# Patient Record
Sex: Male | Born: 1935 | Race: White | Hispanic: No | Marital: Married | Smoking: Former smoker
Health system: Southern US, Community
[De-identification: ages and names within clinical notes are randomized; demographics above are authoritative.]

## PROBLEM LIST (undated history)

## (undated) DIAGNOSIS — R001 Bradycardia, unspecified: Secondary | ICD-10-CM

## (undated) DIAGNOSIS — G459 Transient cerebral ischemic attack, unspecified: Secondary | ICD-10-CM

## (undated) DIAGNOSIS — I43 Cardiomyopathy in diseases classified elsewhere: Secondary | ICD-10-CM

## (undated) DIAGNOSIS — I10 Essential (primary) hypertension: Secondary | ICD-10-CM

## (undated) DIAGNOSIS — E785 Hyperlipidemia, unspecified: Secondary | ICD-10-CM

## (undated) DIAGNOSIS — N400 Enlarged prostate without lower urinary tract symptoms: Secondary | ICD-10-CM

## (undated) DIAGNOSIS — T7840XA Allergy, unspecified, initial encounter: Secondary | ICD-10-CM

## (undated) DIAGNOSIS — E854 Organ-limited amyloidosis: Secondary | ICD-10-CM

## (undated) DIAGNOSIS — D472 Monoclonal gammopathy: Secondary | ICD-10-CM

## (undated) DIAGNOSIS — K219 Gastro-esophageal reflux disease without esophagitis: Secondary | ICD-10-CM

## (undated) HISTORY — DX: Essential (primary) hypertension: I10

## (undated) HISTORY — PX: CARPAL TUNNEL RELEASE: SHX101

## (undated) HISTORY — DX: Allergy, unspecified, initial encounter: T78.40XA

## (undated) HISTORY — DX: Transient cerebral ischemic attack, unspecified: G45.9

## (undated) HISTORY — DX: Hyperlipidemia, unspecified: E78.5

## (undated) HISTORY — DX: Monoclonal gammopathy: D47.2

## (undated) HISTORY — DX: Organ-limited amyloidosis: I43

## (undated) HISTORY — DX: Benign prostatic hyperplasia without lower urinary tract symptoms: N40.0

## (undated) HISTORY — DX: Gastro-esophageal reflux disease without esophagitis: K21.9

## (undated) HISTORY — DX: Organ-limited amyloidosis: E85.4

## (undated) HISTORY — PX: OTHER SURGICAL HISTORY: SHX169

## (undated) HISTORY — PX: DG CHEST FOR MCHS EMPLOYEE: HXRAD142

---
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
## 1963-05-18 HISTORY — PX: OTHER SURGICAL HISTORY: SHX169

## 1989-01-15 DIAGNOSIS — E785 Hyperlipidemia, unspecified: Secondary | ICD-10-CM

## 1989-01-15 HISTORY — DX: Hyperlipidemia, unspecified: E78.5

## 1997-06-17 DIAGNOSIS — N400 Enlarged prostate without lower urinary tract symptoms: Secondary | ICD-10-CM

## 1997-06-17 HISTORY — DX: Benign prostatic hyperplasia without lower urinary tract symptoms: N40.0

## 2000-10-21 ENCOUNTER — Emergency Department (HOSPITAL_COMMUNITY): Admission: EM | Admit: 2000-10-21 | Discharge: 2000-10-21 | Payer: Self-pay | Admitting: *Deleted

## 2000-10-23 ENCOUNTER — Emergency Department (HOSPITAL_COMMUNITY): Admission: EM | Admit: 2000-10-23 | Discharge: 2000-10-23 | Payer: Self-pay | Admitting: Emergency Medicine

## 2001-03-23 HISTORY — PX: ESOPHAGOGASTRODUODENOSCOPY: SHX1529

## 2001-10-17 HISTORY — PX: OTHER SURGICAL HISTORY: SHX169

## 2001-12-06 ENCOUNTER — Encounter: Payer: Self-pay | Admitting: Family Medicine

## 2001-12-06 LAB — CONVERTED CEMR LAB: Hgb A1c MFr Bld: 5.4 %

## 2003-07-16 HISTORY — PX: CHOLECYSTECTOMY: SHX55

## 2003-09-17 ENCOUNTER — Emergency Department (HOSPITAL_COMMUNITY): Admission: EM | Admit: 2003-09-17 | Discharge: 2003-09-17 | Payer: Self-pay | Admitting: *Deleted

## 2003-09-28 ENCOUNTER — Ambulatory Visit (HOSPITAL_COMMUNITY): Admission: RE | Admit: 2003-09-28 | Discharge: 2003-09-28 | Payer: Self-pay | Admitting: Gastroenterology

## 2003-11-29 ENCOUNTER — Encounter (INDEPENDENT_AMBULATORY_CARE_PROVIDER_SITE_OTHER): Payer: Self-pay | Admitting: Specialist

## 2003-11-30 ENCOUNTER — Inpatient Hospital Stay (HOSPITAL_COMMUNITY): Admission: RE | Admit: 2003-11-30 | Discharge: 2003-12-01 | Payer: Self-pay | Admitting: Surgery

## 2004-01-22 ENCOUNTER — Emergency Department (HOSPITAL_COMMUNITY): Admission: EM | Admit: 2004-01-22 | Discharge: 2004-01-22 | Payer: Self-pay | Admitting: Emergency Medicine

## 2004-01-24 ENCOUNTER — Emergency Department (HOSPITAL_COMMUNITY): Admission: EM | Admit: 2004-01-24 | Discharge: 2004-01-24 | Payer: Self-pay | Admitting: Emergency Medicine

## 2004-01-29 ENCOUNTER — Emergency Department (HOSPITAL_COMMUNITY): Admission: EM | Admit: 2004-01-29 | Discharge: 2004-01-29 | Payer: Self-pay | Admitting: Family Medicine

## 2004-01-31 ENCOUNTER — Emergency Department (HOSPITAL_COMMUNITY): Admission: EM | Admit: 2004-01-31 | Discharge: 2004-01-31 | Payer: Self-pay | Admitting: Family Medicine

## 2004-02-15 DIAGNOSIS — I10 Essential (primary) hypertension: Secondary | ICD-10-CM

## 2004-02-15 HISTORY — DX: Essential (primary) hypertension: I10

## 2004-03-09 ENCOUNTER — Encounter: Payer: Self-pay | Admitting: Family Medicine

## 2004-03-09 LAB — CONVERTED CEMR LAB: PSA: 2.3 ng/mL

## 2004-04-15 ENCOUNTER — Ambulatory Visit: Payer: Self-pay | Admitting: Family Medicine

## 2004-05-14 ENCOUNTER — Ambulatory Visit: Payer: Self-pay | Admitting: Family Medicine

## 2004-08-12 ENCOUNTER — Ambulatory Visit: Payer: Self-pay | Admitting: Family Medicine

## 2005-01-27 ENCOUNTER — Ambulatory Visit: Payer: Self-pay | Admitting: Family Medicine

## 2005-01-27 LAB — CONVERTED CEMR LAB
Hgb A1c MFr Bld: 5.4 %
Microalbumin U total vol: 2.7 mg/L
PSA: 1.79 ng/mL

## 2005-02-01 ENCOUNTER — Ambulatory Visit: Payer: Self-pay | Admitting: Family Medicine

## 2005-02-12 ENCOUNTER — Ambulatory Visit: Payer: Self-pay | Admitting: Family Medicine

## 2005-03-17 ENCOUNTER — Ambulatory Visit: Payer: Self-pay | Admitting: Family Medicine

## 2005-03-25 ENCOUNTER — Ambulatory Visit: Payer: Self-pay | Admitting: Family Medicine

## 2005-07-26 ENCOUNTER — Ambulatory Visit: Payer: Self-pay | Admitting: Family Medicine

## 2005-07-29 ENCOUNTER — Ambulatory Visit: Payer: Self-pay | Admitting: Family Medicine

## 2006-02-09 ENCOUNTER — Ambulatory Visit: Payer: Self-pay | Admitting: Family Medicine

## 2006-03-04 ENCOUNTER — Ambulatory Visit: Payer: Self-pay | Admitting: Family Medicine

## 2006-03-17 ENCOUNTER — Ambulatory Visit: Payer: Self-pay | Admitting: Family Medicine

## 2006-04-18 ENCOUNTER — Ambulatory Visit: Payer: Self-pay | Admitting: Family Medicine

## 2006-10-17 ENCOUNTER — Inpatient Hospital Stay (HOSPITAL_COMMUNITY): Admission: EM | Admit: 2006-10-17 | Discharge: 2006-10-19 | Payer: Self-pay | Admitting: Emergency Medicine

## 2006-10-17 DIAGNOSIS — G459 Transient cerebral ischemic attack, unspecified: Secondary | ICD-10-CM | POA: Insufficient documentation

## 2006-10-18 ENCOUNTER — Encounter (INDEPENDENT_AMBULATORY_CARE_PROVIDER_SITE_OTHER): Payer: Self-pay | Admitting: *Deleted

## 2006-10-18 ENCOUNTER — Ambulatory Visit: Payer: Self-pay | Admitting: Internal Medicine

## 2006-10-18 ENCOUNTER — Encounter: Payer: Self-pay | Admitting: Family Medicine

## 2006-10-18 ENCOUNTER — Ambulatory Visit: Payer: Self-pay | Admitting: Vascular Surgery

## 2006-10-18 HISTORY — PX: OTHER SURGICAL HISTORY: SHX169

## 2006-10-19 ENCOUNTER — Encounter: Payer: Self-pay | Admitting: Family Medicine

## 2006-10-28 ENCOUNTER — Encounter: Payer: Self-pay | Admitting: Family Medicine

## 2006-10-28 DIAGNOSIS — F528 Other sexual dysfunction not due to a substance or known physiological condition: Secondary | ICD-10-CM

## 2006-10-28 DIAGNOSIS — I8390 Asymptomatic varicose veins of unspecified lower extremity: Secondary | ICD-10-CM

## 2006-10-29 DIAGNOSIS — E785 Hyperlipidemia, unspecified: Secondary | ICD-10-CM

## 2006-10-29 DIAGNOSIS — I10 Essential (primary) hypertension: Secondary | ICD-10-CM | POA: Insufficient documentation

## 2006-10-29 DIAGNOSIS — N400 Enlarged prostate without lower urinary tract symptoms: Secondary | ICD-10-CM | POA: Insufficient documentation

## 2006-10-31 ENCOUNTER — Ambulatory Visit: Payer: Self-pay | Admitting: Family Medicine

## 2006-11-01 DIAGNOSIS — I6529 Occlusion and stenosis of unspecified carotid artery: Secondary | ICD-10-CM

## 2006-11-21 ENCOUNTER — Ambulatory Visit: Payer: Self-pay | Admitting: Family Medicine

## 2007-03-06 ENCOUNTER — Ambulatory Visit: Payer: Self-pay | Admitting: Family Medicine

## 2007-03-07 LAB — CONVERTED CEMR LAB
ALT: 26 units/L (ref 0–53)
Albumin: 4.4 g/dL (ref 3.5–5.2)
Alkaline Phosphatase: 45 units/L (ref 39–117)
BUN: 15 mg/dL (ref 6–23)
Bilirubin, Direct: 0.3 mg/dL (ref 0.0–0.3)
CO2: 27 meq/L (ref 19–32)
Calcium: 9.5 mg/dL (ref 8.4–10.5)
Chloride: 108 meq/L (ref 96–112)
Cholesterol: 208 mg/dL (ref 0–200)
Creatinine,U: 113.5 mg/dL
Direct LDL: 157.6 mg/dL
HDL: 39.8 mg/dL (ref >39.0)
Microalb Creat Ratio: 1.8 mg/g (ref 0.0–30.0)
Microalb, Ur: 0.2 mg/dL (ref 0.0–1.9)
PSA: 1.94 ng/mL (ref 0.10–4.00)
TSH: 1.39 microintl units/mL (ref 0.35–5.50)
Total CHOL/HDL Ratio: 5.2
Total Protein: 7.2 g/dL (ref 6.0–8.3)
Triglycerides: 76 mg/dL (ref 0–149)

## 2007-03-09 ENCOUNTER — Ambulatory Visit: Payer: Self-pay | Admitting: Family Medicine

## 2007-04-04 ENCOUNTER — Encounter (INDEPENDENT_AMBULATORY_CARE_PROVIDER_SITE_OTHER): Payer: Self-pay | Admitting: *Deleted

## 2007-04-04 ENCOUNTER — Ambulatory Visit: Payer: Self-pay | Admitting: Family Medicine

## 2007-08-30 ENCOUNTER — Telehealth: Payer: Self-pay | Admitting: Family Medicine

## 2007-09-19 ENCOUNTER — Ambulatory Visit: Payer: Self-pay | Admitting: Family Medicine

## 2007-11-28 ENCOUNTER — Encounter: Payer: Self-pay | Admitting: Family Medicine

## 2008-11-25 ENCOUNTER — Ambulatory Visit: Payer: Self-pay | Admitting: Family Medicine

## 2008-11-25 LAB — CONVERTED CEMR LAB
Albumin: 4.2 g/dL (ref 3.5–5.2)
Alkaline Phosphatase: 35 units/L — ABNORMAL LOW (ref 39–117)
BUN: 16 mg/dL (ref 6–23)
Basophils Absolute: 0 10*3/uL (ref 0.0–0.1)
Bilirubin, Direct: 0.2 mg/dL (ref 0.0–0.3)
Chloride: 105 meq/L (ref 96–112)
Creatinine, Ser: 0.9 mg/dL (ref 0.4–1.5)
Direct LDL: 153.2 mg/dL
Eosinophils Absolute: 0.3 10*3/uL (ref 0.0–0.7)
HCT: 42.6 % (ref 39.0–52.0)
Lymphocytes Relative: 42.1 % (ref 12.0–46.0)
MCHC: 35.4 g/dL (ref 30.0–36.0)
MCV: 97.4 fL (ref 78.0–100.0)
Microalb Creat Ratio: 5.2 mg/g (ref 0.0–30.0)
Microalb, Ur: 0.5 mg/dL (ref 0.0–1.9)
Monocytes Absolute: 0.4 10*3/uL (ref 0.1–1.0)
Neutro Abs: 1.5 10*3/uL (ref 1.4–7.7)
Platelets: 142 10*3/uL — ABNORMAL LOW (ref 150.0–400.0)
Potassium: 4.2 meq/L (ref 3.5–5.1)
TSH: 2.35 microintl units/mL (ref 0.35–5.50)
Total Bilirubin: 1.9 mg/dL — ABNORMAL HIGH (ref 0.3–1.2)
Total CHOL/HDL Ratio: 5
Total Protein: 7.1 g/dL (ref 6.0–8.3)
VLDL: 14.4 mg/dL (ref 0.0–40.0)

## 2008-11-27 ENCOUNTER — Ambulatory Visit: Payer: Self-pay | Admitting: Family Medicine

## 2008-12-20 ENCOUNTER — Ambulatory Visit: Payer: Self-pay | Admitting: Family Medicine

## 2008-12-20 LAB — CONVERTED CEMR LAB: OCCULT 1: NEGATIVE

## 2008-12-23 ENCOUNTER — Encounter (INDEPENDENT_AMBULATORY_CARE_PROVIDER_SITE_OTHER): Payer: Self-pay | Admitting: *Deleted

## 2009-06-09 ENCOUNTER — Telehealth: Payer: Self-pay | Admitting: Family Medicine

## 2009-11-07 ENCOUNTER — Telehealth: Payer: Self-pay | Admitting: Family Medicine

## 2010-03-16 ENCOUNTER — Ambulatory Visit: Payer: Self-pay | Admitting: Family Medicine

## 2010-03-17 LAB — CONVERTED CEMR LAB
BUN: 19 mg/dL (ref 6–23)
Cholesterol: 207 mg/dL — ABNORMAL HIGH (ref 0–200)
Creatinine, Ser: 0.9 mg/dL (ref 0.4–1.5)
Creatinine,U: 169.3 mg/dL
Glucose, Bld: 90 mg/dL (ref 70–99)
Microalb Creat Ratio: 0.5 mg/g (ref 0.0–30.0)
Phosphorus: 3.2 mg/dL (ref 2.3–4.6)
Sodium: 140 meq/L (ref 135–145)
TSH: 1.62 microintl units/mL (ref 0.35–5.50)

## 2010-03-18 ENCOUNTER — Ambulatory Visit: Payer: Self-pay | Admitting: Family Medicine

## 2010-04-23 ENCOUNTER — Ambulatory Visit: Payer: Self-pay | Admitting: Family Medicine

## 2010-06-16 NOTE — Assessment & Plan Note (Signed)
Summary: CPX/CLE   Vital Signs:  Patient profile:   77 year old male Height:      67.5 inches Weight:      141.75 pounds BMI:     21.95 Temp:     98.5 degrees F oral Pulse rate:   60 / minute Pulse rhythm:   regular BP sitting:   120 / 70  (left arm) Cuff size:   regular  Vitals Entered By: Sydell Axon LPN 2010/03/28 1:52 PM) CC: 30 Minute checkup   History of Present Illness: Pt here for yearly appt...not seen  by doctor since last year. he feels well and has no complaints. He has more chest discomfort when he begins to eat he has "stopping of food passage" in mid chest. He then notes air biubble and hiccups. He then notices fairly acute resolution. He is unaware of anything prior to episcodes. This happens probably every other week.  He has a BP diary with him, generally good.  Occas he has tingling in the hands, right hand in middle and third fingers, and generally in the left. This happens typically at night.  Preventive Screening-Counseling & Management  Alcohol-Tobacco     Alcohol drinks/day: 0     Smoking Status: quit     Packs/Day: 45PYH     Year Started: 1939     Year Quit: 1972     Pack years: 75     Passive Smoke Exposure: no  Caffeine-Diet-Exercise     Caffeine use/day: 1     Does Patient Exercise: yes     Type of exercise: walking, bicycling     Times/week: 4  Problems Prior to Update: 1)  Special Screening Malig Neoplasms Other Sites  (ICD-V76.49) 2)  Carotid Artery Stenosis, Right  (ICD-433.10) 3)  Tia  (ICD-435.9) 4)  Erectile Dysfunction  (ICD-302.72) 5)  Abnormal Glucose Nec  (ICD-790.29) 6)  Gilbert's Syndrome  (ICD-277.4) 7)  Varicose Veins, Lower Extremities  (ICD-454.9) 8)  Benign Prostatic Hypertrophy  (ICD-600.00) 9)  Hypertension  (ICD-401.9) 10)  Hyperlipidemia  (ICD-272.4)  Medications Prior to Update: 1)  Lisinopril 5 Mg  Tabs (Lisinopril) .Marland Kitchen.. 1 Daily By Mouth 2)  Aggrenox 25-200 Mg  Cp12 (Aspirin-Dipyridamole) .Marland Kitchen.. 1  Capsule Twice  A Day By Mouth 3)  Fish Oil Concentrate 1000 Mg  Caps (Omega-3 Fatty Acids) .Marland Kitchen.. 1 Tablet Twice A Day By Mouth 4)  Benafiber .Marland Kitchen.. 1 Qd 5)  Viagra 100 Mg  Tabs (Sildenafil Citrate) .... 1/2 Tablet Pior To Relations  Allergies: 1)  ! Zocor 2)  ! Lipitor 3)  ! * Vytorin  Past History:  Past Medical History: Last updated: 10/28/2006 Hyperlipidemia: (1990'S) Hypertension :(02/2004) Benign prostatic hypertrophy:(06/1997)  Past Surgical History: Last updated: 11/27/2008 APPENDECTOMY 1947 BILAT HERNIA REPAIR  1965 CHOLEYCYSTECYOMY  07/2003 EGD  SCHATZKI RING DILATED, [facility] (Dr Maryruth Bun) (11/072002) HOLTER MONITOR ISOL PVCs SVCs  (10/17/2001) [facility] TIA, DYSLIPIDEMIA ,HTN ,RIGHT SIDED CAROTID ARTERY STENOSIS 40 TO 60% 06/02-08/2006) MRI BRAIN  NML  MRA BRAIN  INTRACRAN ATHERSC DZ  MCA RPCA 10/18/2006  HEAD CT NML 10/17/2006 MRI CERV SPINE  MILD DEGEN CHANGES C4/5 C5/6 10/18/2006  Family History: Last updated: 2010-03-28 Father:DECEASED 84 YOA: CIRRHOSIS/ NON-ACHOLOL  Mother: DECEASED 67 YOA STROKES, NATURAL CAUSES BROTHER A 79 LEONARD  BROTHER A  76 MELVIN   NEPHRECTOMY BROTHER A  65 CARY ETOH BILL DECEASED FROM MELANOMA AT 57YOA GENE DECEASED CANCER OF THE LUNGS AT 22 YOA (SMOKER) SISTER :DM, CAD,AFIB., GI BLEED,  FE DEFICIENCY CV: + SISTER HBP: NEGATIVE DM: +SISTER PROSTATE CANCER: BROTHER 1 MELANOMA/ 1 OF LUNG DEPRESSION: NEGATIVE ETOH/DRUG ABUSE: NEGATIVE OTHER + STROKE MOTHER   Social History: Last updated: 11/27/2008 Marital Status: Married: REMARRIED  Lives w/wife Children: 4 STEPDAUGHTERS Occupation: RETIRED [employer]/ WORKS 4 DAYS A WEEK AT [employer]  Risk Factors: Alcohol Use: 0 (03/18/2010) Caffeine Use: 1 (03/18/2010) Exercise: yes (03/18/2010)  Risk Factors: Smoking Status: quit (03/18/2010) Packs/Day: 45PYH (03/18/2010) Passive Smoke Exposure: no (03/18/2010)  Family History: Father:DECEASED 64 YOA: CIRRHOSIS/ NON-ACHOLOL  Mother: DECEASED  33 YOA STROKES, NATURAL CAUSES BROTHER A 79 LEONARD  BROTHER A  76 MELVIN   NEPHRECTOMY BROTHER A  67 CARY ETOH BILL DECEASED FROM MELANOMA AT 59YOA GENE DECEASED CANCER OF THE LUNGS AT 31 YOA (SMOKER) SISTER :DM, CAD,AFIB., GI BLEED,  FE DEFICIENCY CV: + SISTER HBP: NEGATIVE DM: +SISTER PROSTATE CANCER: BROTHER 1 MELANOMA/ 1 OF LUNG DEPRESSION: NEGATIVE ETOH/DRUG ABUSE: NEGATIVE OTHER + STROKE MOTHER   Social History: Caffeine use/day:  1  Review of Systems General:  Denies chills, fatigue, fever, sweats, weakness, and weight loss. Eyes:  Denies blurring, discharge, eye irritation, and eye pain; recent eye exam, mild scri[pt change.. ENT:  Complains of decreased hearing; denies ear discharge, earache, and postnasal drainage; baseline.. CV:  Denies chest pain or discomfort, fainting, fatigue, palpitations, shortness of breath with exertion, swelling of feet, and swelling of hands. Resp:  Denies cough, shortness of breath, and wheezing. GI:  Denies abdominal pain, bloody stools, change in bowel habits, constipation, dark tarry stools, diarrhea, indigestion, loss of appetite, nausea, vomiting, vomiting blood, and yellowish skin color. GU:  Complains of nocturia and urinary frequency; denies discharge and dysuria. MS:  Complains of cramps; denies joint pain, low back pain, and muscle aches; hand  and rare crampos. Derm:  Denies dryness, itching, and rash. Neuro:  Complains of tingling; denies poor balance and tremors; hands per HPIU.  Physical Exam  General:  Well-developed,well-nourished,in no acute distress; alert,appropriate and cooperative throughout examination, looks good. Head:  Normocephalic and atraumatic without obvious abnormalities. No apparent alopecia, has signif male pattern  balding. Eyes:  Conjunctiva clear bilaterally.  Ears:  External ear exam shows no significant lesions or deformities.  Otoscopic examination reveals clear canals, tympanic membranes are intact  bilaterally without bulging, retraction, inflammation or discharge. Hearing is grossly normal bilaterally. Nose:  External nasal examination shows no deformity or inflammation. Nasal mucosa are pink and moist without lesions or exudates. Mouth:  Oral mucosa and oropharynx without lesions or exudates.  Teeth in good repair. Neck:  No deformities, masses, or tenderness noted. Chest Wall:  No deformities, masses, tenderness or gynecomastia noted. Breasts:  No masses or gynecomastia noted Lungs:  Normal respiratory effort, chest expands symmetrically. Lungs are clear to auscultation, no crackles or wheezes. Heart:  Normal rate and regular rhythm. S1 and S2 normal without gallop, murmur, click, rub or other extra sounds. Abdomen:  Bowel sounds positive,abdomen soft and non-tender without masses, organomegaly or hernias noted. Rectal:  No external abnormalities noted. Normal sphincter tone. No rectal masses or tenderness. Mild decompressed ext hemm on left. G neg. Genitalia:  Testes bilaterally descended without nodularity, tenderness or masses. No scrotal masses or lesions. No penis lesions or urethral discharge. Prostate:  Prostate gland firm and smooth, no enlargement, nodularity, tenderness, mass, asymmetry or induration. 30-40gms. Msk:  No deformity or scoliosis noted of thoracic or lumbar spine.   Pulses:  R and L carotid,radial,femoral,dorsalis pedis and posterior tibial pulses  are full and equal bilaterally Extremities:  No clubbing, cyanosis, edema, or deformity noted with normal full range of motion of all joints.  Tinel's mildly pods on left. Phalen's negative. Neurologic:  No cranial nerve deficits noted. Station and gait are normal. Sensory, motor and coordinative functions appear intact. Skin:  Intact without suspicious lesions or rashes, disseminated occas benign moles. Cervical Nodes:  No lymphadenopathy noted Inguinal Nodes:  No significant adenopathy Psych:  Cognition and judgment  appear intact. Alert and cooperative with normal attention span and concentration. No apparent delusions, illusions, hallucinations   Impression & Recommendations:  Problem # 1:  ABNORMAL GLUCOSE NEC (ICD-790.29) Assessment Improved  Normal and has been for a while...consider resolved.  Labs Reviewed: Creat: 0.9 (03/16/2010)   Microalbumin: 2.7 (01/27/2005)  Problem # 2:  VARICOSE VEINS, LOWER EXTREMITIES (ICD-454.9) Assessment: Improved Wears support hose with good results. Continue.  Problem # 3:  BENIGN PROSTATIC HYPERTROPHY (ICD-600.00) Assessment: Unchanged Stable with mild sxs, cont to follow.  Problem # 4:  HYPERTENSION (ICD-401.9) Assessment: Unchanged Stable, diary generally great nos. His updated medication list for this problem includes:    Lisinopril 5 Mg Tabs (Lisinopril) .Marland Kitchen... 1 daily by mouth  BP today: 120/70 Prior BP: 120/60 (11/27/2008)  Labs Reviewed: K+: 4.0 (03/16/2010) Creat: : 0.9 (03/16/2010)   Chol: 207 (03/16/2010)   HDL: 37.60 (03/16/2010)   LDL: DEL (03/06/2007)   TG: 96.0 (03/16/2010)  Problem # 5:  HYPERLIPIDEMIA (ICD-272.4) Assessment: Unchanged Adequate but watch diet to attempt getting LDL lower. Labs Reviewed: SGOT: 26 (11/25/2008)   SGPT: 26 (11/25/2008)   HDL:37.60 (03/16/2010), 40.50 (11/25/2008)  LDL:DEL (03/06/2007)  Chol:207 (03/16/2010), 209 (11/25/2008)  Trig:96.0 (03/16/2010), 72.0 (11/25/2008)  Complete Medication List: 1)  Lisinopril 5 Mg Tabs (Lisinopril) .Marland Kitchen.. 1 daily by mouth 2)  Aggrenox 25-200 Mg Cp12 (Aspirin-dipyridamole) .Marland Kitchen.. 1 capsule twice  a day by mouth 3)  Fish Oil Concentrate 1000 Mg Caps (Omega-3 fatty acids) .Marland Kitchen.. 1 tablet twice a day by mouth 4)  Viagra 100 Mg Tabs (Sildenafil citrate) .... 1/2 tablet pior to relations 5)  Benefiber Tabs (Wheat dextrin) .... Take one by mouth daily  Patient Instructions: 1)  RTC 1 month. 2)  Try Jogging in a Jug daily for one month. 3)  Try cockup splints of wrists at  night.   Orders Added: 1)  Est. Patient Level IV [84132]    Current Allergies (reviewed today): ! ZOCOR ! LIPITOR ! * VYTORIN

## 2010-06-16 NOTE — Assessment & Plan Note (Signed)
Summary: ONE MONTH FOLLOW UP / LFW   Vital Signs:  Patient profile:   77 year old male Weight:      141.25 pounds Temp:     98.1 degrees F oral Pulse rate:   60 / minute Pulse rhythm:   regular BP sitting:   116 / 60  (left arm) Cuff size:   regular  Vitals Entered By: Sydell Axon LPN (April 23, 2010 10:15 AM) CC: One month follow-up   History of Present Illness: Pt here for followup of difficulty swallowing at times. His wife is using Jogging in a Jug, having made it herself. He has been using a commercial concoction he bought over the internet. He has had no severe episodes of difficulty passing food since being seen.  He has had endoscopy in the pas with dilation. Our discussion eventuated on his not wanting to have dilation again if not needed. He has had a cold in the past and has some residual cough.  His younger broither, a political figure in [place] is currently in [facility] for detox and ETOHism trmt. He thinks his brother has lost the will to live. He also has ED, uses Viagra but has had instances of no ejaculation.  Problems Prior to Update: 1)  Special Screening Malig Neoplasms Other Sites  (ICD-V76.49) 2)  Carotid Artery Stenosis, Right  (ICD-433.10) 3)  Tia  (ICD-435.9) 4)  Erectile Dysfunction  (ICD-302.72) 5)  Gilbert's Syndrome  (ICD-277.4) 6)  Varicose Veins, Lower Extremities  (ICD-454.9) 7)  Benign Prostatic Hypertrophy  (ICD-600.00) 8)  Hypertension  (ICD-401.9) 9)  Hyperlipidemia  (ICD-272.4)  Medications Prior to Update: 1)  Lisinopril 5 Mg  Tabs (Lisinopril) .Marland Kitchen.. 1 Daily By Mouth 2)  Aggrenox 25-200 Mg  Cp12 (Aspirin-Dipyridamole) .Marland Kitchen.. 1 Capsule Twice  A Day By Mouth 3)  Fish Oil Concentrate 1000 Mg  Caps (Omega-3 Fatty Acids) .Marland Kitchen.. 1 Tablet Twice A Day By Mouth 4)  Viagra 100 Mg  Tabs (Sildenafil Citrate) .... 1/2 Tablet Pior To Relations 5)  Benefiber  Tabs (Wheat Dextrin) .... Take One By Mouth Daily  Current Medications (verified): 1)   Lisinopril 5 Mg  Tabs (Lisinopril) .Marland Kitchen.. 1 Daily By Mouth 2)  Aggrenox 25-200 Mg  Cp12 (Aspirin-Dipyridamole) .Marland Kitchen.. 1 Capsule Twice  A Day By Mouth 3)  Fish Oil Concentrate 1000 Mg  Caps (Omega-3 Fatty Acids) .Marland Kitchen.. 1 Tablet Twice A Day By Mouth 4)  Viagra 100 Mg  Tabs (Sildenafil Citrate) .... 1/2 Tablet Pior To Relations 5)  Benefiber  Tabs (Wheat Dextrin) .... Take One By Mouth Daily  Allergies: 1)  ! Zocor 2)  ! Lipitor 3)  ! * Vytorin  Physical Exam  General:  Well-developed,well-nourished,in no acute distress; alert,appropriate and cooperative throughout examination, looks good. Head:  Normocephalic and atraumatic without obvious abnormalities. No apparent alopecia, has signif male pattern  balding. Eyes:  Conjunctiva clear bilaterally.  Ears:  External ear exam shows no significant lesions or deformities.  Otoscopic examination reveals clear canals, tympanic membranes are intact bilaterally without bulging, retraction, inflammation or discharge. Hearing is grossly normal bilaterally. Nose:  External nasal examination shows no deformity or inflammation. Nasal mucosa are pink and moist without lesions or exudates. Mouth:  Oral mucosa and oropharynx without lesions or exudates.  Teeth in good repair. Neck:  No deformities, masses, or tenderness noted. Chest Wall:  No deformities, masses, tenderness or gynecomastia noted. Lungs:  Normal respiratory effort, chest expands symmetrically. Lungs are clear to auscultation, no crackles or  wheezes. Heart:  Normal rate and regular rhythm. S1 and S2 normal without gallop, murmur, click, rub or other extra sounds. Abdomen:  Bowel sounds positive,abdomen soft and non-tender without masses, organomegaly or hernias noted.   Impression & Recommendations:  Problem # 1:  DYSPHAGIA UNSPEC (ESOPH SPH) (ICD-787.20) Assessment Improved Cont with Jogging in a Jug. See no reason to need commercial version. Homemade fine. RTC if sxs worsen or call...may need  dilation.  Problem # 2:  ERECTILE DYSFUNCTION (ICD-302.72) Assessment: Unchanged Helped with Viagra but occas has no ejcaculation. Discussed. His updated medication list for this problem includes:    Viagra 100 Mg Tabs (Sildenafil citrate) .Marland Kitchen... 1/2 tablet pior to relations  Complete Medication List: 1)  Lisinopril 5 Mg Tabs (Lisinopril) .Marland Kitchen.. 1 daily by mouth 2)  Aggrenox 25-200 Mg Cp12 (Aspirin-dipyridamole) .Marland Kitchen.. 1 capsule twice  a day by mouth 3)  Fish Oil Concentrate 1000 Mg Caps (Omega-3 fatty acids) .Marland Kitchen.. 1 tablet twice a day by mouth 4)  Viagra 100 Mg Tabs (Sildenafil citrate) .... 1/2 tablet pior to relations 5)  Benefiber Tabs (Wheat dextrin) .... Take one by mouth daily  Patient Instructions: 1)  Discussed brother's situation as well. Gave some reassurance.   Orders Added: 1)  Est. Patient Level III [04540]    Current Allergies (reviewed today): ! ZOCOR ! LIPITOR ! * VYTORIN

## 2010-06-16 NOTE — Progress Notes (Signed)
Summary: Viagra  Phone Note Refill Request Message from:  [employer] on November 07, 2009 11:11 AM  Refills Requested: Medication #1:  VIAGRA 100 MG  TABS 1/2 tablet pior to relations. [facility]*   Last Fill Date:  10/16/2009   Pharmacy Phone:  [phone]   Method Requested: Electronic Initial call taken by: Delilah Shan CMA Duncan Dull),  November 07, 2009 11:11 AM    Prescriptions: VIAGRA 100 MG  TABS (SILDENAFIL CITRATE) 1/2 tablet pior to relations  #10 x 11   Entered and Authorized by:   Shaune Leeks MD   Signed by:   Shaune Leeks MD on 11/09/2009   Method used:   Electronically to        [facility]* (retail)       [address]       [place], [place]  [address]       Ph: [phone]       Fax: [phone]   RxID:   [id number]

## 2010-06-16 NOTE — Progress Notes (Signed)
Summary: Rx Aggrenox  Phone Note Refill Request Call back at [phone] Message from:  [facility] on June 09, 2009 8:37 AM  Refills Requested: Medication #1:  AGGRENOX 25-200 MG  CP12 1 CAPSULE twice  a day by mouth   Last Refilled: 05/01/2009 Received e-scribe refill request   Method Requested: Electronic Initial call taken by: Sydell Axon LPN,  June 09, 2009 8:37 AM    Prescriptions: AGGRENOX 25-200 MG  CP12 (ASPIRIN-DIPYRIDAMOLE) 1 CAPSULE twice  a day by mouth  #60 x 12   Entered and Authorized by:   Shaune Leeks MD   Signed by:   Shaune Leeks MD on 06/09/2009   Method used:   Electronically to        [facility]* (retail)       [address]       [place], [place]  [address]       Ph: [phone]       Fax: [phone]   RxID:   [id number]

## 2010-09-08 ENCOUNTER — Other Ambulatory Visit: Payer: Self-pay | Admitting: Family Medicine

## 2010-09-29 NOTE — Consult Note (Signed)
[name], [name] NO.:  [id number]   MEDICAL RECORD NO.:  [id number]          PATIENT TYPE:  OBV   LOCATION:  3742                         FACILITY:  [facility]   PHYSICIAN:  Bevelyn Buckles. Champey, M.D.DATE OF BIRTH:  Feb 09, 1924   DATE OF CONSULTATION:  DATE OF DISCHARGE:                                 CONSULTATION   NEUROLOGY CONSULT:   REQUESTING PHYSICIAN:  Dr. Freida Busman.   REASON FOR CONSULTATION:  Transient ischemic attack.   HISTORY OF PRESENT ILLNESS:  [name] is an 77 year old Caucasian male  with a past medical history of hypertension and hyperlipidemia who had  onset of left-sided numbness and weakness that occurred today.  Patient  stated he was in a normal state of health when he woke up this morning,  when he was making breakfast around 6:30 a.m. he noticed a tingling in  his left upper extremity, noticed weak and clumsiness in his left upper  extremity and felt weak in his left lower extremity.  Patient's symptoms  lasted less than 1 hour.  Patient still states he has slight left hand  tingling.  He had a similar event 2 months ago with left-sided weakness.  He denies any headache, neck pain, vision changes, speech changes,  swallowing problems, chewing problems, dizziness, vertigo or loss of  consciousness.   PAST MEDICAL HISTORY:  Positive for:  1. Hypertension.  2. Hyperlipidemia.   CURRENT MEDICATIONS:  Include, lisinopril, Benefiber, fish oil and  aspirin.   ALLERGIES:  PATIENT HAS NO KNOWN DRUG ALLERGIES.   FAMILY HISTORY:  Positive for hypertension, diabetes, stroke and cancer.   SOCIAL HISTORY:  Patient lives with his wife.  Denies any smoking or  alcohol use.   REVIEW OF SYSTEMS:  Positive as per HPI.  Review of systems negative as  per HPI and greater than other review of systems.   PHYSICAL EXAMINATION:  VITAL SIGNS:  Temperature is 98.0.  Pulse is 57.  Respirations 20.  Blood pressure 154/77.  O2 saturation is 97%.  HEENT:   Normocephalic, atraumatic.  Extraocular muscles are intact.  Visual fields are intact.  NECK:  Supple.  No carotid bruits.  HEART:  Regular.  LUNGS:  Clear.  ABDOMEN:  Soft.  EXTREMITIES:  Show good pulses.  NEUROLOGICAL EXAMINATION:  Patient is awake and alert, lying absolute.  Memory and drift within normal limits.  Cranial nerves II-XII are  grossly intact.  Motor examination, shows 5/5 strength, no drift.  Sensory examination is within normal limits to light touch.  Reflexes  are trace to 1+ throughout.  Toes are neutral bilaterally.  Cerebellar  function is within normal limits finger-to-nose.  Gait is not assessed  secondary to safety.   LABORATORY DATA:  WBC is 4.9, hemoglobin 15.2, hematocrit is 43.8,  platelets 159.  PT is 15.3, INR is 1.2.  Sodium is 138, potassium is  4.0, chloride is 105, CO2 is 27, BUN 12, creatinine 0.85, glucose 97,  calcium is 9.3, total bilirubin is 1.8.  LFTs are normal.  Cholesterol  done at 8:45 showed a total cholesterol of 194, LDL  of 138 (question  fasting).  CK was 94, CK-MB was 2.4.  CT of the head showed no acute  findings.   IMPRESSION:  This is an 77 year old Caucasian male with transient left-  sided numbness and weakness, which has resolved.  Transient ischemic  attack versus possible small stroke.  Recommend getting an MRI,  including an MRA of the brain.  We will change his aspirin to Aggrenox.  Get carotid Dopplers and 2D echocardiogram.  We check a fasting lipid  and homocystine level in the morning.  We will get physical therapy and  occupational therapy consults.  We will continue his other home  medications and we will follow patient in short consult service.      Bevelyn Buckles. Nash Shearer, M.D.  Electronically Signed     DRC/MEDQ  D:  10/17/2006  T:  10/18/2006  Job:  [id number]

## 2010-09-29 NOTE — Discharge Summary (Signed)
[name], [name]                ACCOUNT NO.:  [id number]   MEDICAL RECORD NO.:  [id number]          PATIENT TYPE:  OBV   LOCATION:  3742                         FACILITY:  [facility]   PHYSICIAN:  Valerie A. Felicity Coyer, MDDATE OF BIRTH:  24-Mar-1924   DATE OF ADMISSION:  10/17/2006  DATE OF DISCHARGE:  10/19/2006                               DISCHARGE SUMMARY   DISCHARGE DIAGNOSES:  1. Transient ischemic attack.  2. Dyslipidemia.  3. Hypertension.  4. Right-sided carotid artery stenosis 40 to 60%.   HISTORY OF PRESENT ILLNESS:  [name] is an 77 year old white male  with a past medical history of hypertension, hyperlipidemia who  presented with chief complaint of abnormal sensation and tingling on the  left side of his body.  The patient and wife noted that he was unable to  grab objects at breakfast that day.  His episode lasted approximately 45  minutes to 1 hour, and his symptoms greatly improved.  He was admitted  for further evaluation and treatment.   PAST MEDICAL HISTORY:  1. Hypertension.  2. Hyperlipidemia.  3. Status post appendectomy.  4. Status post cholecystectomy.  5. Status post bilateral hernia repairs.   COURSE OF HOSPITALIZATION:  1. TIA:  The patient was admitted and underwent a CT of the head,      which showed no acute changes.  An MRI/MRA of the brain was      performed, which showed no acute intracranial abnormality.  It did      note chronic sinusitis and atherosclerotic disease, especially in      the right MCA and right PCA.  An MRI of the c-spine was also      performed at the same time, which noted mild degenerative changes      and small central disk protrusions, C4-5 and C5-6.  The patient      underwent a 2D echo, which at the time of this dictation is      currently pending.  Carotid Doppler was performed, which noted 40-      60% right ICA stenosis.  The patient has been placed on Aggrenox      during this admission per neurology  recommendations.  2. Hyperlipidemia:  The patient was noted to have elevated LDL and low      HDL.  It was recommended that the patient be placed on a Statin.      According to the patient, he had previously tried Lipitor in the      past; however, this caused heart palpitations and was discontinued.      The patient notes that he has stopped taking his Zetia as      prescribed prior to this admission and in its place has been taking      fish oil and baby aspirin.  He was started on Zocor during this      admission; however, he is currently refusing due to headache;      however, headache is most likely secondary to addition of Aggrenox      and should improve over the next week  or so.  Could consider      reintroducing Statin at that time.  We will defer to patient's      primary care physician.   MEDICATIONS AT THE TIME OF DISCHARGE:  1. Lisinopril 5 mg p.o. daily.  2. Aggrenox 1 cap p.o. b.i.d.   PERTINENT LABORATORY AT THE TIME OF DISCHARGE:  Hemoglobin A1c 5.7, TSH  1.53, homocysteine 9.0.   DISPOSITION:  The patient will be discharged to home.   FOLLOWUP:  He is instructed to follow up with Dr. Hetty Ely in 1 to 2  weeks and contact the office for an appointment.  He is also instructed  to return to the emergency room should he have recurrent tingling,  weakness, or slurry speech.      Sandford Craze, NP      Raenette Rover. Felicity Coyer, MD  Electronically Signed    MO/MEDQ  D:  10/19/2006  T:  10/19/2006  Job:  [id number]   cc:   Arta Silence, MD

## 2010-09-29 NOTE — H&P (Signed)
NAMEKOBE, OFALLON NO.:  [id number]   MEDICAL RECORD NO.:  [id number]          PATIENT TYPE:  OBV   LOCATION:  1824                         FACILITY:  [facility]   PHYSICIAN:  Barbette Hair. Artist Pais, DO      DATE OF BIRTH:  October 22, 1923   DATE OF ADMISSION:  10/17/2006  DATE OF DISCHARGE:                              HISTORY & PHYSICAL   PRIMARY CARE PHYSICIAN:  Arta Silence, MD   HISTORY OF PRESENT ILLNESS:  The patient is an 77 year old white male  with past medical history of hypertension and hyperlipidemia who  presents with abnormal sensation, tingling, on the left side of the  body.  He awoke this morning at approximately 6 a.m. with a tingling  sensation down his left arm.  This spread to his left leg.  The patient  and wife noted that he was unable to grip objects at the breakfast  table.  This episode lasted approximately 45 minutes to 1 hour.  There  were no changes in his speech, and his symptoms gradually improved where  now he only notes tingling sensation in his left forearm.  Approximately  2 months ago, the patient had similar symptoms that were reported to Dr.  Hetty Ely.  He denies any associated symptoms such as chest pain,  palpitations, shortness of breath.  He was in his usual state of health  prior to this morning's episode.   PAST MEDICAL HISTORY:  1. Hypertension, mild, started on ACE inhibitor one year ago.  2. Hyperlipidemia.  Primary prevention with intolerance to LIPITOR      which causes palpitations.  3. Status post appendectomy.  4. Status post cholecystectomy.  5. Status post hernia repair bilaterally.   REVIEW OF SYSTEMS:  No other associated symptoms.  He denies any history  of neck injury or nerve impingement.  No chronic back pain, no headache,  no change in vision.   CURRENT MEDICATIONS:  \  1. Lisinopril 5 mg once a day.  2. Aspirin 81 mg once a day.  3. Zetia 10 mg which he has not take recently.   SOCIAL HISTORY:   Retired from [employer] as a Pharmacist, hospital.  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  He is married  to his second wife, has four step children.   HABITS:  Remote history of tobacco, quit in 1972, 35 to 45-pack-year  history.  He does not drink any alcohol.   FAMILY HISTORY:  Mother deceased from stroke.  Father had cirrhosis not  related to alcohol.  On brother had melanoma, a sister with diabetes.   SUPPLEMENTS:  Over-the-counter fish oil and Benefiber.   LABORATORY DATA:  CBC showed WBC 4.9, hemoglobin 15.2, hematocrit 42.8,  platelet count 159.  INR 1.2.  Comprehensive metabolic profile notable  for normal electrolytes.  Potassium 4.0, sodium 138, glucose 97, BUN 12,  creatinine 0.85. Bilirubin total was slightly elevated at 1.8, otherwise  other transaminases were normal.   The patient had CT of the head which revealed no acute intracranial  findings.   Chest  x-ray was unremarkable.   PHYSICAL EXAMINATION:  VITAL SIGNS:  Temperature 97.9, blood pressure  113/67, pulse 65, respirations 16.  The patient is 97-99% on room air.  GENERAL:  The patient is a thin 77 year old white male in no apparent  distress.  HEENT:  Normocephalic and atraumatic.  Pupils equal, round, and reactive  to light bilaterally.  Extraocular mobility was intact.  The patient was  anicteric.  Conjunctivae within normal limits.  Oropharyngeal exam was  unremarkable.  Uvula was midline.  NECK:  Supple.  No adenopathy, carotid bruit, or adenopathy.  CHEST:  Normal respiratory effort.  Clear to auscultation bilaterally.  No rhonchi, rales, or wheezing.  CARDIOVASCULAR:  Regular rate and rhythm.  No significant murmurs, rubs,  or gallops appreciated.  ABDOMEN:  Soft, nontender.  Positive bowel sounds.  No organomegaly.  MUSCULOSKELETAL:  The patient had bilateral Heberden's and Bouchard  nodes.  No clubbing, no lower extremity edema.  NEUROLOGIC:  Cranial nerves II-XII intact.   Nonfocal.  The patient had  5/5 muscle strength throughout.  He had no pronator drift.  No  cerebellar signs.  Reflexes were 0-1 throughout. He had negative  Babinski.   IMPRESSION:  1. Paresthesia, left side, of unclear etiology.  2. Hypertension, controlled.  3. History of hyperlipidemia.   RECOMMENDATIONS:  The patient's symptoms may be consistent with TIA or  mild lacunar stroke.  The patient will be admitted for further  observation.  We will order an MRI of the brain, and neurology consult  will be considered.  We will cycle cardiac enzymes, repeat A1c, check  thyroid studies, and check homocysteine level.  Also we will check a  carotid Doppler.  If MRI is completely normal, then I recommend further  workup as an outpatient with possible EMG to the left arm.      Barbette Hair. Artist Pais, DO  Electronically Signed     RDY/MEDQ  D:  10/17/2006  T:  10/17/2006  Job:  [id number]   cc:   Arta Silence, MD

## 2010-10-02 NOTE — Op Note (Signed)
NAMESAHEED, CARRINGTON NO.:  [id number]   MEDICAL RECORD NO.:  [id number]                   PATIENT TYPE:  OBV   LOCATION:  0349                                 FACILITY:  [facility]   PHYSICIAN:  Thornton Park. Daphine Deutscher, M.D.             DATE OF BIRTH:  05-Jul-1923   DATE OF PROCEDURE:  11/29/2003  DATE OF DISCHARGE:                                 OPERATIVE REPORT   CCS NUMBER:  [id number].   PREOPERATIVE DIAGNOSIS:  Cholecystitis.   POSTOPERATIVE DIAGNOSIS:  Acute and subacute cholecystitis with proximal  common bile duct stone.   PROCEDURE:  Laparoscopic cholecystectomy with intraoperative cholangiogram.   SURGEON:  Thornton Park. Daphine Deutscher, M.D.   ASSISTANT:  Ollen Gross. Carolynne Edouard, M.D.   ANESTHESIA:  General endotracheal.   DRAINS:  One Jackson-Pratt drain to the gallbladder fossa.   DESCRIPTION OF PROCEDURE:  [name] was taken to [facility] on Friday,  November 29, 2003 and given general anesthesia.  The abdomen was prepped with  Betadine and draped sterilely.  I entered with a longitudinal incision into  the umbilicus without difficulty using the Hasson technique and insufflated  the abdomen.  Three ports were placed in the upper abdomen.  The gallbladder  was noted to be walled off with an omental pack, and this was stripped away.  Pictures, which are in the chart, show the gallbladder to be fiery red with  some splotches of green, signifying early gangrenous changes of the  gallbladder wall anteriorly.  I was able to strip these away and dissect  free the cystic duct, and I inserted a Reddick catheter through the medial  port using the stem introducer.  Then a cholangiogram was obtained, in which  I initial filled the distal common bile duct and then deflated the balloon,  allowing for proximal proper hepatic filling.  One filling defect was  questioned by Dr. Allyson Sabal, whether this may be overlapping shadows or in fact  a nonmoving stone, it is not clear, but will  be noted.  In the meantime, the  cystic duct had been dissected free, triple clipped, divided.  The cystic  artery triply clipped and divided.  The gallbladder removed from the  gallbladder bed with some difficulty because of the inflammatory changes.  I  also entered it where it was necrotic.  I sucked out stuff as it came along  and controlled any kind of leakage, and we essentially got no spillage that  I could assess.  When detached, I placed the gallbladder into a bag and  brought it out through the umbilicus.   I went back in and irrigated the gallbladder bed.  I saw no bleeding or bile  leaks and no stony material but because of the intense degree of the  inflammatory reaction, I placed a drain in the subhepatic space.  All port  sites had been injected with 0.5% Marcaine, and  the umbilical port was  closed with three interrupted sutures of 0 Vicryl.  The wounds were then  irrigated and closed with 4-0 Vicryl, Benzoin, and Steri-Strips.  The  patient seemed to tolerate the procedure well, and he was taken to the  recovery room in satisfactory condition.   FINAL DIAGNOSES:  Acute and subacute cholecystitis with possible filling  defect up into the proper hepatic duct, status post laparoscopic  cholecystectomy with intraoperative cholangiogram.                                               Thornton Park. Daphine Deutscher, M.D.    MBM/MEDQ  D:  11/29/2003  T:  11/29/2003  Job:  [id number]   cc:   Bonnita Levan  [address]  [place]  [place] [address]  Fax: [phone]   Dr. Talbot Grumbling, [place]   Graylin Shiver, M.D.  [address]  [address]  [place], [place] [address]  Fax: [phone]

## 2011-03-04 LAB — DIFFERENTIAL
Basophils Relative: 0
Eosinophils Absolute: 0.3
Neutrophils Relative %: 36 — ABNORMAL LOW

## 2011-03-04 LAB — COMPREHENSIVE METABOLIC PANEL
ALT: 20
BUN: 12
CO2: 27
Calcium: 9.3
Chloride: 105
Creatinine, Ser: 0.85
GFR calc Af Amer: >60
GFR calc non Af Amer: >60
Glucose, Bld: 97
Potassium: 4
Sodium: 138

## 2011-03-04 LAB — LIPID PANEL
Cholesterol: 182
HDL: 31 — ABNORMAL LOW
Triglycerides: 159 — ABNORMAL HIGH

## 2011-03-04 LAB — HOMOCYSTEINE: Homocysteine: 9

## 2011-03-04 LAB — APTT: aPTT: 29

## 2011-03-04 LAB — CK TOTAL AND CKMB (NOT AT ARMC)
CK, MB: 2.4
Total CK: 94

## 2011-03-04 LAB — CBC
Hemoglobin: 15.2
MCHC: 34.7
MCV: 94.4
RDW: 13.1

## 2011-03-04 LAB — PROTIME-INR: INR: 1.2

## 2011-05-13 ENCOUNTER — Other Ambulatory Visit: Payer: Self-pay | Admitting: Family Medicine

## 2011-05-13 NOTE — Telephone Encounter (Signed)
Received refill request electronically from pharmacy. Patient has not been seen in over a year.

## 2011-07-01 ENCOUNTER — Other Ambulatory Visit: Payer: Self-pay | Admitting: Family Medicine

## 2011-07-01 DIAGNOSIS — I10 Essential (primary) hypertension: Secondary | ICD-10-CM

## 2011-07-12 NOTE — Telephone Encounter (Signed)
Received refill request electronically from pharmacy. Is it okay to refill medication? 

## 2011-07-13 NOTE — Telephone Encounter (Signed)
Patient says his intention is to set up with Dr. Patsy Lager and will make that appt in the very near future.  Pt says he is going to stop the Aggrenox anyway and just take an 81 mg. Aspirin.

## 2011-07-13 NOTE — Telephone Encounter (Signed)
Needs OV with labs ahead of time.

## 2011-08-02 ENCOUNTER — Encounter: Payer: Self-pay | Admitting: Family Medicine

## 2011-08-05 ENCOUNTER — Ambulatory Visit (INDEPENDENT_AMBULATORY_CARE_PROVIDER_SITE_OTHER): Payer: 59 | Admitting: Family Medicine

## 2011-08-05 ENCOUNTER — Encounter: Payer: Self-pay | Admitting: Family Medicine

## 2011-08-05 VITALS — BP 124/70 | HR 60 | Temp 97.4°F | Ht 67.5 in | Wt 144.5 lb

## 2011-08-05 DIAGNOSIS — Z1211 Encounter for screening for malignant neoplasm of colon: Secondary | ICD-10-CM

## 2011-08-05 DIAGNOSIS — E785 Hyperlipidemia, unspecified: Secondary | ICD-10-CM

## 2011-08-05 DIAGNOSIS — Z8042 Family history of malignant neoplasm of prostate: Secondary | ICD-10-CM

## 2011-08-05 DIAGNOSIS — F528 Other sexual dysfunction not due to a substance or known physiological condition: Secondary | ICD-10-CM

## 2011-08-05 DIAGNOSIS — N400 Enlarged prostate without lower urinary tract symptoms: Secondary | ICD-10-CM

## 2011-08-05 DIAGNOSIS — I6529 Occlusion and stenosis of unspecified carotid artery: Secondary | ICD-10-CM

## 2011-08-05 DIAGNOSIS — I10 Essential (primary) hypertension: Secondary | ICD-10-CM

## 2011-08-05 DIAGNOSIS — Z789 Other specified health status: Secondary | ICD-10-CM

## 2011-08-05 DIAGNOSIS — G459 Transient cerebral ischemic attack, unspecified: Secondary | ICD-10-CM

## 2011-08-05 MED ORDER — LISINOPRIL 5 MG PO TABS: 5.0000 mg | ORAL_TABLET | Freq: Every day | ORAL | Status: DC

## 2011-08-05 MED ORDER — SILDENAFIL CITRATE 100 MG PO TABS: ORAL_TABLET | ORAL | Status: DC

## 2011-08-05 NOTE — Patient Instructions (Signed)
See Shirlee Limerick about your referral before you leave today. Come back for fasting labs and the stool cards.   Don't change your meds for now.  We'll contact you with your lab report. Glad to see you.

## 2011-08-05 NOTE — Progress Notes (Signed)
H/o TIA.  Wasn't on ASA at the time.  We discussed options.  No recent sx.   FH of prostate cancer.  PSA discussed.  Prev PSA wnl.  We agreed to continue PSA checks.  Colon cancer screening.  IFOB discussed.  He was asking about the usefulness of continued IFOBs.  We agreed to continue IFOB checks given his overall health  He has a living will.  Full code per patient but wouldn't want prolonged vent/etc if quality of life wouldn't be good.   Hypertension:    Using medication without problems: yes, he had rare and limited lightheadedness Chest pain with exertion:no Edema:no Short of breath:no  H/o ED.  Controlled with viagra.  No ADE.  Good effect.    HLD.  Statin intolerant.  We discussed.  Due for labs.    Meds, vitals, and allergies reviewed.   PMH and SH reviewed  ROS: See HPI.  Otherwise negative.    GEN: nad, alert and oriented HEENT: mucous membranes moist NECK: supple w/o LA CV: rrr. PULM: ctab, no inc wob ABD: soft, +bs EXT: no edema SKIN: no acute rash

## 2011-08-06 DIAGNOSIS — Z7189 Other specified counseling: Secondary | ICD-10-CM | POA: Insufficient documentation

## 2011-08-06 DIAGNOSIS — Z1211 Encounter for screening for malignant neoplasm of colon: Secondary | ICD-10-CM | POA: Insufficient documentation

## 2011-08-06 NOTE — Assessment & Plan Note (Signed)
Continue current meds 

## 2011-08-06 NOTE — Assessment & Plan Note (Signed)
D/w patient re:options for colon cancer screening, including IFOB vs. colonoscopy.  Risks and benefits of both were discussed and patient voiced understanding.  Pt elects for:IFOB  

## 2011-08-06 NOTE — Assessment & Plan Note (Signed)
Continue ASA and ACE.  He wasn't an ASA failure prev.  Check dopplers.

## 2011-08-06 NOTE — Assessment & Plan Note (Signed)
Will check PSA given FH.  We discussed options [id number] PSA.

## 2011-08-06 NOTE — Assessment & Plan Note (Signed)
Recheck dopplers given TIA hx.

## 2011-08-06 NOTE — Assessment & Plan Note (Signed)
Controlled , no change in meds 

## 2011-08-06 NOTE — Assessment & Plan Note (Signed)
Statin intolerant.  Continue diet and exercise.  

## 2011-08-12 ENCOUNTER — Encounter (INDEPENDENT_AMBULATORY_CARE_PROVIDER_SITE_OTHER): Payer: 59

## 2011-08-12 DIAGNOSIS — R209 Unspecified disturbances of skin sensation: Secondary | ICD-10-CM

## 2011-08-12 DIAGNOSIS — R42 Dizziness and giddiness: Secondary | ICD-10-CM

## 2011-08-12 DIAGNOSIS — I6529 Occlusion and stenosis of unspecified carotid artery: Secondary | ICD-10-CM

## 2011-08-12 DIAGNOSIS — G459 Transient cerebral ischemic attack, unspecified: Secondary | ICD-10-CM

## 2011-08-18 ENCOUNTER — Encounter: Payer: Self-pay | Admitting: Family Medicine

## 2011-08-19 ENCOUNTER — Telehealth: Payer: Self-pay | Admitting: Family Medicine

## 2011-08-19 NOTE — Telephone Encounter (Signed)
Message copied by Lars Mage on Thu Aug 19, 2011  3:52 PM ------      Message from: Carlton Adam      Created: Thu Aug 19, 2011  2:45 PM       Cardiology [facility]  says that they have put him on a reminder call list for a 6 month followup carotid.      ----- Message -----         From: Joaquim Nam, MD         Sent: 08/19/2011   1:15 PM           To: Carlton Adam            Patient is to have repeat carotid study in 6 months.  See notes on the image report.  I don't know if the order should come through [place] or cards.  Please talk to scheduling there to clarify.  Thanks.              Clelia Croft

## 2011-08-19 NOTE — Telephone Encounter (Signed)
Noted  

## 2012-05-09 ENCOUNTER — Other Ambulatory Visit: Payer: Self-pay | Admitting: *Deleted

## 2012-05-09 MED ORDER — SILDENAFIL CITRATE 20 MG PO TABS: ORAL_TABLET | ORAL | Status: DC

## 2012-05-09 NOTE — Telephone Encounter (Signed)
Per pharmacist it is the generic sildenafil citrate 20 mg being requested, but there is also a 25 mg.

## 2012-05-09 NOTE — Telephone Encounter (Signed)
Notified Pharm. that Rx was sent in and they checked and had received it

## 2012-05-09 NOTE — Telephone Encounter (Signed)
Okay to change.  rx sent.  Thanks.

## 2012-05-09 NOTE — Telephone Encounter (Signed)
Received fax request from pharmacy. Pt request refill of Viagra, but is requesting the generic in 20 mg to take 2-3 tabs with #90. Per pharmacist # 90 at generic lower dose will cost pt $150 instead of the $100 he's paying for the 100 mg  # 10. Is it ok to change rx?

## 2012-05-09 NOTE — Telephone Encounter (Signed)
Clarify this with pharamacy.  They don't make a 20mg  tab.  Do you mean the 25mg ?

## 2012-06-12 ENCOUNTER — Ambulatory Visit (INDEPENDENT_AMBULATORY_CARE_PROVIDER_SITE_OTHER): Payer: 59 | Admitting: Family Medicine

## 2012-06-12 ENCOUNTER — Encounter: Payer: Self-pay | Admitting: Family Medicine

## 2012-06-12 VITALS — BP 142/80 | HR 74 | Temp 99.2°F | Wt 140.0 lb

## 2012-06-12 DIAGNOSIS — Z125 Encounter for screening for malignant neoplasm of prostate: Secondary | ICD-10-CM

## 2012-06-12 DIAGNOSIS — Z8042 Family history of malignant neoplasm of prostate: Secondary | ICD-10-CM

## 2012-06-12 DIAGNOSIS — I10 Essential (primary) hypertension: Secondary | ICD-10-CM

## 2012-06-12 DIAGNOSIS — R1310 Dysphagia, unspecified: Secondary | ICD-10-CM

## 2012-06-12 DIAGNOSIS — G459 Transient cerebral ischemic attack, unspecified: Secondary | ICD-10-CM

## 2012-06-12 NOTE — Patient Instructions (Addendum)
Go to the lab on the way out.  We'll contact you with your lab report.  I would take prilosec/omeprazole 20mg  a day.  If that doesn't help, then notify me.   Glad to see you.

## 2012-06-12 NOTE — Progress Notes (Signed)
He can eat sometimes and do well but occ he'll eat and "everything starts to back up."  "It seems like there's an air bubble in my esophagus."  Frequent hiccups during these episodes. Feeling of food/liquiss occ sticking near the stomach.  This isn't a new sx (gotten worse in the last year, going on for years), he had talked with prev MD about it - per patient it was thought he had esophageal spasms.  It has an irregular onset.  No vomiting o/w.  No diarrhea.  No blood in vomit or stool.  No clear triggers known.  He hasn't tried any meds yet.    He had prev EGD dilation prev.  He has had episodes similar today with a prolonged episode of likely spasm, ie lasting hours, that eventually self resolved.  Last UOP just before clinic, at baseline.  No fevers.  No abd pain.    Meds, vitals, and allergies reviewed.   ROS: See HPI.  Otherwise, noncontributory.  nad ncat Mmm Neck supple rrr ctab abd soft, not ttp Ext w/o edema Normal skin turgor and cap refill

## 2012-06-13 ENCOUNTER — Encounter: Payer: Self-pay | Admitting: Gastroenterology

## 2012-06-13 ENCOUNTER — Telehealth: Payer: Self-pay | Admitting: *Deleted

## 2012-06-13 ENCOUNTER — Ambulatory Visit (HOSPITAL_COMMUNITY)
Admission: RE | Admit: 2012-06-13 | Discharge: 2012-06-13 | Disposition: A | Discharge status: Home / Self Care | Payer: Medicare Other | Source: Ambulatory Visit | Attending: Internal Medicine | Admitting: Internal Medicine

## 2012-06-13 ENCOUNTER — Ambulatory Visit (INDEPENDENT_AMBULATORY_CARE_PROVIDER_SITE_OTHER): Payer: Medicare Other | Admitting: Gastroenterology

## 2012-06-13 ENCOUNTER — Encounter: Payer: Self-pay | Admitting: *Deleted

## 2012-06-13 ENCOUNTER — Encounter (HOSPITAL_COMMUNITY)
Admission: RE | Disposition: A | Discharge status: Home / Self Care | Payer: Self-pay | Source: Ambulatory Visit | Attending: Internal Medicine

## 2012-06-13 ENCOUNTER — Encounter (HOSPITAL_COMMUNITY): Payer: Self-pay

## 2012-06-13 ENCOUNTER — Encounter (HOSPITAL_COMMUNITY): Payer: Self-pay | Admitting: *Deleted

## 2012-06-13 ENCOUNTER — Other Ambulatory Visit: Payer: Self-pay | Admitting: Internal Medicine

## 2012-06-13 VITALS — BP 118/66 | HR 70 | Ht 68.0 in | Wt 136.0 lb

## 2012-06-13 DIAGNOSIS — R1319 Other dysphagia: Secondary | ICD-10-CM | POA: Insufficient documentation

## 2012-06-13 DIAGNOSIS — R1310 Dysphagia, unspecified: Secondary | ICD-10-CM

## 2012-06-13 DIAGNOSIS — K449 Diaphragmatic hernia without obstruction or gangrene: Secondary | ICD-10-CM | POA: Insufficient documentation

## 2012-06-13 DIAGNOSIS — Q391 Atresia of esophagus with tracheo-esophageal fistula: Secondary | ICD-10-CM | POA: Insufficient documentation

## 2012-06-13 DIAGNOSIS — T18108A Unspecified foreign body in esophagus causing other injury, initial encounter: Secondary | ICD-10-CM | POA: Insufficient documentation

## 2012-06-13 DIAGNOSIS — Q393 Congenital stenosis and stricture of esophagus: Secondary | ICD-10-CM | POA: Insufficient documentation

## 2012-06-13 DIAGNOSIS — IMO0002 Reserved for concepts with insufficient information to code with codable children: Secondary | ICD-10-CM | POA: Insufficient documentation

## 2012-06-13 DIAGNOSIS — K222 Esophageal obstruction: Secondary | ICD-10-CM

## 2012-06-13 DIAGNOSIS — K209 Esophagitis, unspecified without bleeding: Secondary | ICD-10-CM | POA: Insufficient documentation

## 2012-06-13 DIAGNOSIS — R1314 Dysphagia, pharyngoesophageal phase: Secondary | ICD-10-CM

## 2012-06-13 DIAGNOSIS — I10 Essential (primary) hypertension: Secondary | ICD-10-CM | POA: Insufficient documentation

## 2012-06-13 HISTORY — PX: ESOPHAGOGASTRODUODENOSCOPY: SHX5428

## 2012-06-13 LAB — COMPREHENSIVE METABOLIC PANEL
ALT: 19 U/L (ref 0–53)
AST: 24 U/L (ref 0–37)
Albumin: 4.3 g/dL (ref 3.5–5.2)
Calcium: 9.9 mg/dL (ref 8.4–10.5)
Chloride: 105 mEq/L (ref 96–112)
Creatinine, Ser: 1 mg/dL (ref 0.4–1.5)
Potassium: 4.7 mEq/L (ref 3.5–5.1)

## 2012-06-13 LAB — LIPID PANEL
Total CHOL/HDL Ratio: 6
VLDL: 12.2 mg/dL (ref 0.0–40.0)

## 2012-06-13 SURGERY — EGD (ESOPHAGOGASTRODUODENOSCOPY)
Anesthesia: Moderate Sedation

## 2012-06-13 MED ORDER — MIDAZOLAM HCL 5 MG/ML IJ SOLN
INTRAMUSCULAR | Status: AC
  Filled 2012-06-13: qty 2

## 2012-06-13 MED ORDER — PANTOPRAZOLE SODIUM 40 MG PO TBEC: 40.0000 mg | DELAYED_RELEASE_TABLET | Freq: Every day | ORAL | Status: DC

## 2012-06-13 MED ORDER — FENTANYL CITRATE 0.05 MG/ML IJ SOLN
INTRAMUSCULAR | Status: AC
  Filled 2012-06-13: qty 2

## 2012-06-13 MED ORDER — SODIUM CHLORIDE 0.9 % IV SOLN
INTRAVENOUS | Status: DC
  Administered 2012-06-13: 500 mL via INTRAVENOUS

## 2012-06-13 MED ORDER — BUTAMBEN-TETRACAINE-BENZOCAINE 2-2-14 % EX AERO
INHALATION_SPRAY | CUTANEOUS | Status: DC | PRN
  Administered 2012-06-13: 2 via TOPICAL

## 2012-06-13 MED ORDER — FENTANYL CITRATE 0.05 MG/ML IJ SOLN
INTRAMUSCULAR | Status: DC | PRN
  Administered 2012-06-13 (×2): 12.5 ug via INTRAVENOUS
  Administered 2012-06-13: 25 ug via INTRAVENOUS

## 2012-06-13 MED ORDER — MIDAZOLAM HCL 10 MG/2ML IJ SOLN
INTRAMUSCULAR | Status: DC | PRN
  Administered 2012-06-13 (×5): 1 mg via INTRAVENOUS

## 2012-06-13 NOTE — Telephone Encounter (Signed)
Dr Jarold Motto wanted patient added on for this afternoon to see him.  Patient said that he will come this afternoon at 130 depending on weather.  Patient said he has not seen a GI doctor and if he ever did was so long ago he does not remember. No recent procedures.

## 2012-06-13 NOTE — Patient Instructions (Addendum)
Please go to [facility] registration near short stay for your procedure.

## 2012-06-13 NOTE — H&P (Signed)
   SUBJECTIVE: HPI [name] is an 79 year old male with a past medical history hypertension, hyperlipidemia, BPH who presented to the office today for evaluation by Dr. Jarold Motto for evaluation of worsening dysphagia. He reports worsening dysphagia since Sunday after eating chicken. He reports intermittent problems with solid food dysphagia for years, and recalls remote esophageal dilation. He reports usually he is able to get the food to advance into the stomach after a few hours and by drinking fluids. Since Sunday he's had the constant feeling that something is stuck in his lower esophagus, and at times he has not been able to tolerate liquids or solids. No abdominal pain. No nausea or vomiting. No blood in his stool or melena. He takes no blood thinners other than baby aspirin 81 mg daily  Review of Systems  As per history of present illness, otherwise negative   Past Medical History  Diagnosis Date  . Hyperlipemia 1990's  . Hypertension 02/2004  . BPH (benign prostatic hyperplasia) 06/1997  . TIA (transient ischemic attack)     was not on aspirin at time of TIA per patient    Current Facility-Administered Medications  Medication Dose Route Frequency Provider Last Rate Last Dose  . 0.9 %  sodium chloride infusion   Intravenous Continuous Beverley Fiedler, MD 20 mL/hr at 06/13/12 1444 500 mL at 06/13/12 1444    Allergies  Allergen Reactions  . Atorvastatin     REACTION: PALPITATIONS  . Ezetimibe-Simvastatin     REACTION: HEADACHES  . Simvastatin     REACTION: HEADACHE    Family History  Problem Relation Age of Onset  . Stroke Mother   . Diabetes Sister   . Heart disease Sister     CAD, AFib, GI bleed, FE deficiency  . Alcohol abuse Brother   . Prostate cancer Brother   . Cancer Brother 62    lungs, smoker  . Colon cancer Neg Hx     History  Substance Use Topics  . Smoking status: Former Smoker    Quit date: 05/17/1970  . Smokeless tobacco: Never Used     Comment:  quit 1972  . Alcohol Use: No    OBJECTIVE: BP 157/86  Temp 97.5 F (36.4 C) (Oral)  Resp 14  SpO2 98% Gen: awake, alert, NAD HEENT: anicteric, op clear CV: RRR, no mrg Pulm: CTA b/l Abd: soft, NT/ND, +BS throughout Ext: no c/c/e Neuro: nonfocal   ASSESSMENT AND PLAN: 79 year old male with a past medical history of hypertension and hyperlipidemia who presents to the outpatient endoscopy lab at the hospital with worsening esophageal dysphagia symptoms concerning for food impaction  1.  Esophageal dysphagia/food impaction -- his symptoms are consistent with esophageal stricture, but it is possible that he has retained food/impaction from Sunday. We discussed the need for upper endoscopy for further investigation and relief of food impaction if it is still present. We discussed the test today at length with the patient and his wife, explaining the risks and benefits, allowing time for questions, and they agree to proceed.  We did discuss possible dilation, but if there is any evidence of esophagitis or pressure ulceration, dilation when he be performed at a later time. He understands this recommendation

## 2012-06-13 NOTE — Telephone Encounter (Signed)
Pt says he is marginally better, but is still having difficulty swallowing got a few drops of water down, but couldn't get anymore. He also tried getting sprite down, but couldnt. Pt is interested in referral to GI, he would prefer a Dr in [place], but he is willing to go to [place] whatever you recommend.

## 2012-06-13 NOTE — Progress Notes (Signed)
History of Present Illness:  This is a very nice 79 year old Caucasian male with 3 days of complete dysphagia for solids and liquids.  He's had progressive solid food dysphagia over the last 15-20 years previous dilation by Dr. Maryruth Bun    In [place] several years ago.  He denies true reflux symptoms but does take when necessary antacids.  For the last 48-72 hours he is been unable to consume liquids or solids.  He has mild essential hypertension but otherwise is healthy.  He has not had recent x-rays, endoscopies and CT scans.  He denies any hepatobiliary or systemic complaints.  He apparently has had previous colon cancer screening.  Blood pressure 118/66, pulse 70 and regular, weight under and 36 pounds with a BMI of 20.68. I have reviewed this patient's present history, medical and surgical past history, allergies and medications.     ROS:   All systems were reviewed and are negative unless otherwise stated in the HPI.    Physical Exam: General well developed well nourished patient in no acute distress, appearing their stated age Eyes PERRLA, no icterus, fundoscopic exam per opthamologist Skin no lesions noted Neck supple, no adenopathy, no thyroid enlargement, no tenderness Chest clear to percussion and auscultation Heart no significant murmurs, gallops or rubs noted Abdomen no hepatosplenomegaly masses or tenderness, BS normal.  Extremities no acute joint lesions, edema, phlebitis or evidence of cellulitis. Neurologic patient oriented x 3, cranial nerves intact, no focal neurologic deficits noted. Psychological mental status normal and normal affect.  Assessment and plan: Esophageal obstruction from chronic GERD and stricturing versus achalasia.  Currently the patient cannot consume even liquids without vomiting.  I have referred him to see Dr. Rhea Belton so he can have in-hospital endoscopy, dilatation, admission if indicated.  I spoke with the patient at length about these procedures and  plans, and he agrees to proceed as planned.  No diagnosis found.

## 2012-06-13 NOTE — Op Note (Signed)
[name] [facility] [address] [place], [address]   ENDOSCOPY PROCEDURE REPORT  PATIENT: [name], [name]  MR#: [id number] BIRTHDATE: December 24, 1923 , 88  yrs. old GENDER: Male ENDOSCOPIST: Beverley Fiedler, MD REFERRED BY:  Sheryn Bison PROCEDURE DATE:  06/13/2012 PROCEDURE:  EGD w/ fb removal ASA CLASS:     Class II INDICATIONS:  Dysphagia.   Foreign body removal from esophagus. MEDICATIONS: Versed 5 mg IV and Fentanyl 50 mcg IV TOPICAL ANESTHETIC: Cetacaine Spray  DESCRIPTION OF PROCEDURE: After the risks benefits and alternatives of the procedure were thoroughly explained, informed consent was obtained.  The Pentax Gastroscope [id number] endoscope was introduced through the mouth and advanced to the second portion of the duodenum. Without limitations.  The instrument was slowly withdrawn as the mucosa was fully examined.     ESOPHAGUS: A food bolus was found in the lower esophagus.  With gentle pressure the food bolus was able to be passed with the standard upper adult endoscope.  A Schatzki ring was found 41 cm from the incisors.   Gentle pressure was applied to enter the stomach.  There was a small mucosal tear.  The food bolus was then able to be slowly and carefully advanced into the stomach. Moderately severe esophagitis characterized by erosion and pale mucosa likely secondary to pressure from food bolus was found in the lower third of the esophagus.   A 3 cm hiatal hernia was noted.   STOMACH: The mucosa of the stomach appeared normal, but complete visualization of the gastric body and fundus was obscured by the food bolus  DUODENUM: The duodenal mucosa showed no abnormalities in the bulb and second portion of the duodenum.  Retroflexed views revealed a hiatal hernia.     The scope was then withdrawn from the patient and the procedure completed.  COMPLICATIONS: There were no complications.  ENDOSCOPIC IMPRESSION: 1.   Schatzki ring was  found 41 cm from the incisors 2.   Acute food impaction, advanced into the stomach 2.   Esophagitis in the lower third of the esophagus 3.   3 cm hiatal hernia 4.   The mucosa of the stomach appeared normal; though complete views obscured by food bolus 5.   The duodenal mucosa showed no abnormalities in the bulb and second portion of the duodenum  RECOMMENDATIONS: 1.  Liquid diet the remainder of today.  Soft diet starting tomorrow going forward.  Avoid meats.  Chew food extremely well and take small bites 2.  Start pantoprazole 40 mg daily for acid suppression and healing of esophagitis 3.  Repeat office visit with Dr.  Jarold Motto in about 2 weeks to discuss repeat endoscopy with dilation (after esophagitis has had time to heal)  eSigned:  Beverley Fiedler, MD 06/13/2012 3:36 PM   CC:The Patient and Crawford Givens, MD  PATIENT NAME:  [name], [name] MR#: [id number]

## 2012-06-13 NOTE — Assessment & Plan Note (Signed)
He had asked about f/u of carotids at 1 year and this is reasonable.  Will notify pt.

## 2012-06-13 NOTE — Telephone Encounter (Signed)
It was reasonable to give him the benefit of time last night.  This AM he isn't improved.  I called GI about this and they'll be in touch with the patient this AM about being seen.  I called pt and he agreed.  His last UOP was just before I called him.  I appreciate the help from GI.

## 2012-06-13 NOTE — Assessment & Plan Note (Signed)
With likely combination of prev dilated stricture and esophageal spasm from presumed GERD.  In NAD.  He doesn't need emergent EGD, dilation.  He has had other episodes resolve prev.  I didn't want to try NTG and risk low BP.  He's in NAD.  He'll call back tomorrow AM. If not improved, we can set up GI intervention/eval.  If he improves and can swallow pills tonight, will start on prilosec and follow clinically.  He agrees.

## 2012-06-14 ENCOUNTER — Ambulatory Visit (HOSPITAL_COMMUNITY): Admit: 2012-06-14 | Payer: Self-pay | Admitting: Internal Medicine

## 2012-06-14 ENCOUNTER — Encounter (HOSPITAL_COMMUNITY): Payer: Self-pay | Admitting: Internal Medicine

## 2012-06-16 ENCOUNTER — Telehealth: Payer: Self-pay | Admitting: *Deleted

## 2012-06-16 NOTE — Telephone Encounter (Signed)
Informed pt of foods he can eat on Dysphagia 2 diet and mailed him a pamphlet on the diet; pt stated understanding.

## 2012-06-16 NOTE — Telephone Encounter (Signed)
Spoke with [name] who stated he was told to f/u on 06/23/12 so his appt was changed. [name] states he was instructed to follow a soft diet and avoid meats the day after his procedure until he saw Dr Jarold Motto ; he reports he's hungry. He states he's tired of jello, applesauce, etc. I asked about mashed potatoes and baked potatoes and he stated he was given a sheet stating no potatoes. I cannot find the diet in his chart discharge instructions. Dr Jarold Motto, could [name] advance his diet as tolerated as long as he avoids fried foods, meats, etc? Thanks.

## 2012-06-16 NOTE — Telephone Encounter (Signed)
lmom for pt to call back. Dr Rhea Belton performed foreign body removal on pt on 06/13/12 and recommends pt have f/u with Dr Jarold Motto for possible repeat EGD with dilation. appt 07/04/12 at 1045am.

## 2012-06-16 NOTE — Telephone Encounter (Signed)
Stage 2 dysphagia diet

## 2012-06-21 ENCOUNTER — Telehealth: Payer: Self-pay | Admitting: Gastroenterology

## 2012-06-21 NOTE — Telephone Encounter (Signed)
Pt received his diet info and he wants to know if he can have some flounder; the diet listed flaky fish? Informed pt as long as he takes very small bites and chews his food well, the fish show be OK. He asked about finely chopped cole slaw and I informed him if he takes a small bite and that's all that's in his mouth and he chews well, he may try to see if he can tolerate it; pt stated understanding.

## 2012-06-23 ENCOUNTER — Other Ambulatory Visit (INDEPENDENT_AMBULATORY_CARE_PROVIDER_SITE_OTHER): Payer: Medicare Other

## 2012-06-23 ENCOUNTER — Ambulatory Visit (INDEPENDENT_AMBULATORY_CARE_PROVIDER_SITE_OTHER): Payer: Medicare Other | Admitting: Gastroenterology

## 2012-06-23 ENCOUNTER — Encounter: Payer: Self-pay | Admitting: Gastroenterology

## 2012-06-23 VITALS — BP 120/70 | HR 64 | Ht 67.5 in | Wt 144.6 lb

## 2012-06-23 DIAGNOSIS — R1310 Dysphagia, unspecified: Secondary | ICD-10-CM

## 2012-06-23 DIAGNOSIS — R1314 Dysphagia, pharyngoesophageal phase: Secondary | ICD-10-CM

## 2012-06-23 DIAGNOSIS — R1319 Other dysphagia: Secondary | ICD-10-CM

## 2012-06-23 DIAGNOSIS — K219 Gastro-esophageal reflux disease without esophagitis: Secondary | ICD-10-CM

## 2012-06-23 DIAGNOSIS — Z9049 Acquired absence of other specified parts of digestive tract: Secondary | ICD-10-CM

## 2012-06-23 DIAGNOSIS — Z9089 Acquired absence of other organs: Secondary | ICD-10-CM

## 2012-06-23 LAB — IBC PANEL: Saturation Ratios: 30 % (ref 20.0–50.0)

## 2012-06-23 LAB — CBC WITH DIFFERENTIAL/PLATELET
Basophils Relative: 0.4 % (ref 0.0–3.0)
Eosinophils Absolute: 0.1 10*3/uL (ref 0.0–0.7)
HCT: 42.5 % (ref 39.0–52.0)
Hemoglobin: 14.8 g/dL (ref 13.0–17.0)
Lymphs Abs: 1.9 10*3/uL (ref 0.7–4.0)
MCHC: 34.9 g/dL (ref 30.0–36.0)
MCV: 92.9 fl (ref 78.0–100.0)
Monocytes Absolute: 0.5 10*3/uL (ref 0.1–1.0)
Neutro Abs: 2.5 10*3/uL (ref 1.4–7.7)
RBC: 4.57 Mil/uL (ref 4.22–5.81)

## 2012-06-23 LAB — FOLATE: Folate: >24.8 ng/mL (ref >5.9)

## 2012-06-23 NOTE — Patient Instructions (Signed)
You have been scheduled for an endoscopy with propofol. Please follow written instructions given to you at your visit today. If you use inhalers (even only as needed) or a CPAP machine, please bring them with you on the day of your procedure.  You watched a video today on GERD.  Please follow the Dysphagia diet given today--Level 4.   Information below on GERD and Esophageal Stricture.  Your physician has requested that you go to the basement for the following lab work before leaving today: CBC and Anemia Panel.  ______________________________________________________________________________________________________________________  Diet for Gastroesophageal Reflux Disease, Adult Reflux (acid reflux) is when acid from your stomach flows up into the esophagus. When acid comes in contact with the esophagus, the acid causes irritation and soreness (inflammation) in the esophagus. When reflux happens often or so severely that it causes damage to the esophagus, it is called gastroesophageal reflux disease (GERD). Nutrition therapy can help ease the discomfort of GERD. FOODS OR DRINKS TO AVOID OR LIMIT  Smoking or chewing tobacco. Nicotine is one of the most potent stimulants to acid production in the gastrointestinal tract.  Caffeinated and decaffeinated coffee and black tea.  Regular or low-calorie carbonated beverages or energy drinks (caffeine-free carbonated beverages are allowed).   Strong spices, such as black pepper, white pepper, red pepper, cayenne, curry powder, and chili powder.  Peppermint or spearmint.  Chocolate.  High-fat foods, including meats and fried foods. Extra added fats including oils, butter, salad dressings, and nuts. Limit these to less than 8 tsp per day.  Fruits and vegetables if they are not tolerated, such as citrus fruits or tomatoes.  Alcohol.  Any food that seems to aggravate your condition. If you have questions regarding your diet, call your caregiver  or a registered dietitian. OTHER THINGS THAT MAY HELP GERD INCLUDE:   Eating your meals slowly, in a relaxed setting.  Eating 5 to 6 small meals per day instead of 3 large meals.  Eliminating food for a period of time if it causes distress.  Not lying down until 3 hours after eating a meal.  Keeping the head of your bed raised 6 to 9 inches (15 to 23 cm) by using a foam wedge or blocks under the legs of the bed. Lying flat may make symptoms worse.  Being physically active. Weight loss may be helpful in reducing reflux in overweight or obese adults.  Wear loose fitting clothing EXAMPLE MEAL PLAN This meal plan is approximately 2,000 calories based on https://www.bernard.org/ meal planning guidelines. Breakfast   cup cooked oatmeal.  1 cup strawberries.  1 cup low-fat milk.  1 oz almonds. Snack  1 cup cucumber slices.  6 oz yogurt (made from low-fat or fat-free milk). Lunch  2 slice whole-wheat bread.  2 oz sliced [place].  2 tsp mayonnaise.  1 cup blueberries.  1 cup snap peas. Snack  6 whole-wheat crackers.  1 oz string cheese. Dinner   cup brown rice.  1 cup mixed veggies.  1 tsp olive oil.  3 oz grilled fish. Document Released: 05/03/2005 Document Revised: 07/26/2011 Document Reviewed: 03/19/2011 ExitCare Patient Information 2013 ExitCare, [place].  ______________________________________________________________________________________________________________________  Esophageal Stricture The esophagus is the long, narrow tube which carries food and liquid from the mouth to the stomach. Sometimes a part of the esophagus becomes narrow and makes it difficult, painful, or even impossible to swallow. This is called an esophageal stricture.  CAUSES  Common causes of blockage or strictures of the esophagus are:  Exposure of the  lower esophagus to the acid from the stomach may cause narrowing.  Hiatal hernia in which a small part of the stomach bulges up  through the diaphragm can cause a narrowing in the bottom of the esophagus.  Scleroderma is a tissue disorder that affects the esophagus and makes swallowing difficult.  Achalasia is an absence of nerves in the lower esophagus and to the esophageal sphincter. This absence of nerves may be congenital (present since birth). This can cause irregular spasms which do not allow food and fluid through.  Strictures may develop from swallowing materials which damage the esophagus. Examples are acids or alkalis such as lye.  Schatzki's Ring is a narrow ring of non-cancerous tissue which narrows the lower esophagus. The cause of this is unknown.  Growths can block the esophagus. SYMPTOMS  Some of the problems are difficulty swallowing or pain with swallowing. DIAGNOSIS  Your caregiver often suspects this problem by taking a medical history. They will also do a physical exam. They may then take X-rays and/or perform an endoscopy. Endoscopy is an exam in which a tube like a small flexible telescope is used to look at your esophagus.  TREATMENT  One form of treatment is to dilate the narrow area. This means to stretch it.  When this is not successful, chest surgery may be required. This is a much more extensive form of treatment with a longer recovery time. Both of the above treatments make the passage of food and water into the stomach easier. They also make it easier for stomach contents to bubble back into the esophagus. Special medications may be used following the procedure to help prevent further narrowing. Medications may be used to lower the amount of acid in the stomach juice.  SEEK IMMEDIATE MEDICAL CARE IF:   Your swallowing is becoming more painful, difficult, or you are unable to swallow.  You vomit up blood.  You develop black tarry stools.  You develop chills.  You have a fever.  You develop chest or abdominal pain.  You develop shortness of breath, feel lightheaded, or  faint. Follow up with medical care as your caregiver suggests. Document Released: 01/11/2006 Document Revised: 07/26/2011 Document Reviewed: 02/17/2006 [facility] Patient Information 2013 [place], [place].

## 2012-06-23 NOTE — Progress Notes (Signed)
History of Present Illness: This is a 79 year old Caucasian male recently undergone emergency endoscopy with disimpaction of food from his distal esophagus.  He had an esophageal stricture with severe esophagitis.  He is been on soft diet since his procedure on January 28.  Review of his record shows a long history of reflux and dysphasia, but he was not referred for evaluation by Dr. Hetty Ely in the  the past.  Patient is now on the care of Dr. Crawford Givens. He currently is on Protonix 40 mg a day and denies acid reflux symptoms, chest pain, or dysphagia.  The extent of his prior GI evaluation is unclear, but apparently had an endoscopy some 20 years ago in [place].  In any case, he is age 6, and has no evidence of chronic anemia, and gives no gastrointestinal or hepatobiliary symptomatology.  He is on daily Benefiber, aspirin 81 mg, and Prinivil 5 mg.  There is no history of anorexia, weight loss, but he is status post cholecystectomy.    Current Medications, Allergies, Past Medical History, Past Surgical History, Family History and Social History were reviewed in [employer] record.  ROS: All systems were reviewed and are negative unless otherwise stated in the HPI.         Assessment and plan: BP pressure 120/70, pulse 64 and regular, and weight 144 with a BMI of 22.31.  96% oxygen saturation.  General physical exam was not performed.  This patient has chronic GERD with a history of esophageal stricturing for many years. ,  Now status post recent esophageal dilatation with endoscopy by Dr.  Rhea Belton.He is currently swallowing better, but he needs to continue on PPI therapy for another 2-3 weeks before repeat endoscopy and dilation.  I have shown him our patient education video on GERD and its management.  I also explained in the risk and benefits of esophageal dilatation, and the need possibly for repeat dilatations as needed, also the need for chronic long-term PPI drug  use.  Stage IV dysphagia diet explained to patient. I see no need for colonoscopy in this particular patient at this time.  Review of his labs shows no CBC, and I have ordered CBC and anemia profile.  He otherwise continue his medications as per primary care. No diagnosis found.

## 2012-07-01 ENCOUNTER — Other Ambulatory Visit: Payer: Self-pay

## 2012-07-04 ENCOUNTER — Ambulatory Visit: Payer: Medicare Other | Admitting: Gastroenterology

## 2012-07-07 ENCOUNTER — Encounter: Payer: Self-pay | Admitting: Gastroenterology

## 2012-07-07 ENCOUNTER — Ambulatory Visit (AMBULATORY_SURGERY_CENTER): Payer: Medicare Other | Admitting: Gastroenterology

## 2012-07-07 VITALS — BP 130/67 | HR 51 | Temp 98.3°F | Resp 9 | Ht 68.0 in | Wt 144.0 lb

## 2012-07-07 DIAGNOSIS — K219 Gastro-esophageal reflux disease without esophagitis: Secondary | ICD-10-CM

## 2012-07-07 DIAGNOSIS — R1310 Dysphagia, unspecified: Secondary | ICD-10-CM

## 2012-07-07 DIAGNOSIS — R1314 Dysphagia, pharyngoesophageal phase: Secondary | ICD-10-CM

## 2012-07-07 DIAGNOSIS — K222 Esophageal obstruction: Secondary | ICD-10-CM

## 2012-07-07 MED ORDER — SODIUM CHLORIDE 0.9 % IV SOLN: 500.0000 mL | INTRAVENOUS | Status: DC

## 2012-07-07 NOTE — Op Note (Signed)
[facility] [address] [place], [address]   ENDOSCOPY PROCEDURE REPORT  PATIENT: [name], [name]  MR#: [id number] BIRTHDATE: May 26, 1923 , 88  yrs. old GENDER: Male ENDOSCOPIST:Darcel Frane Hale Bogus, MD, Clementeen Graham REFERRED BY: Crawford Givens, M.D. PROCEDURE DATE:  07/07/2012 PROCEDURE:   EGD, diagnostic and Maloney dilation of esophagus ASA CLASS:    Class III INDICATIONS: Dysphagia and history of esophageal stricture with previous dilation several weeks ago. MEDICATION: Propofol (Diprivan) 80 mg IV TOPICAL ANESTHETIC:  DESCRIPTION OF PROCEDURE:   After the risks and benefits of the procedure were explained, informed consent was obtained.  The LB-GIF Q180 [id number]  endoscope was introduced through the mouth and advanced to the second portion of the duodenum .  The instrument was slowly withdrawn as the mucosa was fully examined.      DUODENUM: The duodenal mucosa showed no abnormalities in the bulb and second portion of the duodenum.  STOMACH: The mucosa of the stomach appeared normal.  ESOPHAGUS: There is a circumferential stricture at the gastroesophageal junction.  This was benign in nature.  He was dilated with a #48 [place] dilator.  There was no bleeding or pain or resistance.    Retroflexed views revealed a hiatal hernia.    The scope was then withdrawn from the patient and the procedure completed.  patient was returned to the recovery room and had no complications.  COMPLICATIONS: There were no complications.   ENDOSCOPIC IMPRESSION: 1.   The duodenal mucosa showed no abnormalities in the bulb and second portion of the duodenum 2.   The mucosa of the stomach appeared normal 3.   There is a circumferential stricture at the gastroesophageal junction.  This was benign in nature.  He was dilated with a #48 [place] dilator.  There was no bleeding or pain or resistance.  findings consistent with chronic GERD and recurrent peptic strictures  of the esophagus  RECOMMENDATIONS: 1.  Continue current meds 2.  Dilatations PRN    _______________________________ eSigned:  Mardella Layman, MD, [facility] 07/07/2012 2:57 PM      PATIENT NAME:  [name] MR#: [id number]

## 2012-07-07 NOTE — Progress Notes (Signed)
Called to room to assist during endoscopic procedure.  Patient ID and intended procedure confirmed with present staff. Received instructions for my participation in the procedure from the performing physician.  

## 2012-07-07 NOTE — Progress Notes (Signed)
Patient did not have preoperative order for IV antibiotic SSI prophylaxis. (G8918)  Patient did not experience any of the following events: a burn prior to discharge; a fall within the facility; wrong site/side/patient/procedure/implant event; or a hospital transfer or hospital admission upon discharge from the facility. (G8907)  

## 2012-07-07 NOTE — Progress Notes (Signed)
Report to pacu rn, vss, bbs=clear,sv sa02 99%

## 2012-07-10 ENCOUNTER — Telehealth: Payer: Self-pay

## 2012-07-10 NOTE — Telephone Encounter (Signed)
  Follow up Call-  Call back number 07/07/2012  Post procedure Call Back phone  # [phone]  Permission to leave phone message Yes     Patient questions:  Do you have a fever, pain , or abdominal swelling? no Pain Score  0 *  Have you tolerated food without any problems? yes  Have you been able to return to your normal activities? yes  Do you have any questions about your discharge instructions: Diet   no Medications  no Follow up visit  no  Do you have questions or concerns about your Care? no  Actions: * If pain score is 4 or above: No action needed, pain <4.

## 2012-09-02 ENCOUNTER — Other Ambulatory Visit: Payer: Self-pay | Admitting: Family Medicine

## 2012-09-08 ENCOUNTER — Encounter: Payer: Self-pay | Admitting: Family Medicine

## 2012-09-08 ENCOUNTER — Ambulatory Visit (INDEPENDENT_AMBULATORY_CARE_PROVIDER_SITE_OTHER): Payer: Medicare Other | Admitting: Family Medicine

## 2012-09-08 VITALS — BP 122/62 | HR 63 | Temp 98.1°F | Wt 144.5 lb

## 2012-09-08 DIAGNOSIS — R6883 Chills (without fever): Secondary | ICD-10-CM

## 2012-09-08 LAB — BASIC METABOLIC PANEL
CO2: 27 mEq/L (ref 19–32)
Calcium: 9.2 mg/dL (ref 8.4–10.5)
Chloride: 98 mEq/L (ref 96–112)
Potassium: 4 mEq/L (ref 3.5–5.1)
Sodium: 134 mEq/L — ABNORMAL LOW (ref 135–145)

## 2012-09-08 LAB — CBC WITH DIFFERENTIAL/PLATELET
Basophils Relative: 0.1 % (ref 0.0–3.0)
Eosinophils Absolute: 0.1 10*3/uL (ref 0.0–0.7)
Hemoglobin: 14.6 g/dL (ref 13.0–17.0)
Lymphocytes Relative: 16.4 % (ref 12.0–46.0)
MCHC: 33.8 g/dL (ref 30.0–36.0)
Neutro Abs: 9.8 10*3/uL — ABNORMAL HIGH (ref 1.4–7.7)
RBC: 4.6 Mil/uL (ref 4.22–5.81)

## 2012-09-08 NOTE — Progress Notes (Signed)
He is overall improved after his EGD and dilation.    "I'm here today at the insistence of my family."  3 weeks ago he had a short episode of chills, felt cold.  It resolved.  Felt well until 2 nights ago he had another episode that was similar. He shivered throughout the night and his appetite was decreased.  He felt weak overall.  He didn't eat yesterday, but sipped on fluids.  He feels better today, ate breakfast as he would normally.  No fevers known, but his wife thought he felt hot.  No vomiting but had some dry heaves a few days ago.  No blood in stool. No myalgias. He feels well now.    Meds, vitals, and allergies reviewed.   ROS: See HPI.  Otherwise, noncontributory.  GEN: nad, alert and oriented HEENT: mucous membranes moist NECK: supple w/o LA CV: rrr. PULM: ctab, no inc wob ABD: soft, +bs EXT: no edema SKIN: no acute rash

## 2012-09-08 NOTE — Patient Instructions (Signed)
Go to the lab on the way out.  We'll contact you with your lab report.  Stop the lisinopril for now and see if the cough improves by the end of next week.  Let me know one way or the other.  Take care.

## 2012-09-10 DIAGNOSIS — R6883 Chills (without fever): Secondary | ICD-10-CM | POA: Insufficient documentation

## 2012-09-10 NOTE — Assessment & Plan Note (Signed)
Unclear source, well appearing and w/o sx now.  See notes on labs.  Unless he continue to have symptoms, this likely won't need further w/u.

## 2012-09-12 ENCOUNTER — Encounter: Payer: Self-pay | Admitting: *Deleted

## 2012-09-15 ENCOUNTER — Telehealth: Payer: Self-pay

## 2012-09-15 NOTE — Telephone Encounter (Signed)
I called pt back.  He hasn't had anymore episodes of the chills.  He still has the episodic cough.  He had mentioned it before.  Coming off the ACE didn't help.  It can happen morning or afternoon.  It doesn't wake him at night.  The cough has been going on chronically, ever since a cold a few years ago.  It would be reasonable to get him in next week for a CXR and recheck.  He may have a GERD related cough and we can discuss. He'll restart the ACE.

## 2012-09-15 NOTE — Telephone Encounter (Signed)
Appt scheduled

## 2012-09-15 NOTE — Telephone Encounter (Signed)
Pt left v/m; stopped lisinopril 1 week ago and essentially no change in cough.Please advise.

## 2012-09-18 ENCOUNTER — Ambulatory Visit (INDEPENDENT_AMBULATORY_CARE_PROVIDER_SITE_OTHER): Payer: Medicare Other | Admitting: Family Medicine

## 2012-09-18 ENCOUNTER — Ambulatory Visit (INDEPENDENT_AMBULATORY_CARE_PROVIDER_SITE_OTHER)
Admission: RE | Admit: 2012-09-18 | Discharge: 2012-09-18 | Disposition: A | Discharge status: Home / Self Care | Payer: Medicare Other | Source: Ambulatory Visit | Attending: Family Medicine | Admitting: Family Medicine

## 2012-09-18 ENCOUNTER — Encounter: Payer: Self-pay | Admitting: Family Medicine

## 2012-09-18 VITALS — BP 132/76 | HR 54 | Temp 97.6°F | Wt 144.5 lb

## 2012-09-18 DIAGNOSIS — R05 Cough: Secondary | ICD-10-CM

## 2012-09-18 NOTE — Patient Instructions (Addendum)
Go to the lab on the way out.  We'll contact you with your xray report. Assuming your xray looks okay, start taking claritin 10mg  a day for about 10-14 days.  If not improved, then try taking the protonix twice a day for 10-14 days.  If still not better, then notify us.  Take care.

## 2012-09-18 NOTE — Assessment & Plan Note (Signed)
cxr today.  If unremarkable, then trial of claritin.  If not improved, then trial of BID ppd.  If not improved by then, he'll notify us.  ddx dw pt.

## 2012-09-18 NOTE — Progress Notes (Signed)
He still has the episodic cough. He had mentioned it before. Coming off the ACE inhibitor didn't help. It can happen morning or afternoon. It doesn't wake him at night. The cough has been going on chronically, ever since a cold a few years ago.  Prev with esophageal stricture dilation and still on PPI qd.  No fevers or chills.  Swallowing okay, still with an occ change in sensation during swallowing where he'll get a change in taste just after swallowing.  He has had trouble with the lower denture fitting and is planning on f/u about this- this has made it harder to chew effectively.    Distant h/o smoking, quit in 1972.  He doesn't wheeze or get SOB unless as expected with very strenuous exercise.  He does have some post nasal gtt.  He has frequent throat clearing.    He feels well o/w.   Meds, vitals, and allergies reviewed.   ROS: See HPI.  Otherwise, noncontributory.  GEN: nad, alert and oriented HEENT: mucous membranes moist, tm wnl, nasal exam with scant clear rhinorrhea, OP wnl NECK: supple w/o LA CV: rrr. PULM: ctab, no inc wob EXT: no edema SKIN: no acute rash

## 2012-10-03 ENCOUNTER — Telehealth: Payer: Self-pay | Admitting: *Deleted

## 2012-10-03 DIAGNOSIS — R05 Cough: Secondary | ICD-10-CM

## 2012-10-03 NOTE — Telephone Encounter (Signed)
I would try taking the protonix twice a day for 10-14 days.  If still not better, then notify us. We may need to get him to see pulmonary if the cough continues.  Thanks.

## 2012-10-03 NOTE — Telephone Encounter (Signed)
Patient advised.

## 2012-10-03 NOTE — Telephone Encounter (Signed)
Pt wanted to inform you that Claritin has not helped his cough.

## 2012-11-06 MED ORDER — PANTOPRAZOLE SODIUM 40 MG PO TBEC: 40.0000 mg | DELAYED_RELEASE_TABLET | Freq: Two times a day (BID) | ORAL | Status: DC

## 2012-11-06 NOTE — Telephone Encounter (Signed)
Noted, sent.  Thanks.   

## 2012-11-06 NOTE — Assessment & Plan Note (Signed)
Improved with BID PPI 10/2012.

## 2012-11-06 NOTE — Addendum Note (Signed)
Addended by: Joaquim Nam on: 11/06/2012 01:44 PM   Modules accepted: Orders

## 2012-11-06 NOTE — Telephone Encounter (Signed)
Pt left v/m; taking pantoprazole twice daily has helped pt's cough and request new rx pantoprazole 40 mg taking one tab twice a day sent to [facility].Please advise.

## 2012-12-20 ENCOUNTER — Other Ambulatory Visit: Payer: Self-pay

## 2013-02-13 ENCOUNTER — Ambulatory Visit (INDEPENDENT_AMBULATORY_CARE_PROVIDER_SITE_OTHER): Payer: Medicare Other | Admitting: Family Medicine

## 2013-02-13 ENCOUNTER — Encounter: Payer: Self-pay | Admitting: Family Medicine

## 2013-02-13 VITALS — BP 152/80 | HR 64 | Temp 97.4°F | Wt 149.5 lb

## 2013-02-13 DIAGNOSIS — I6529 Occlusion and stenosis of unspecified carotid artery: Secondary | ICD-10-CM

## 2013-02-13 DIAGNOSIS — I10 Essential (primary) hypertension: Secondary | ICD-10-CM

## 2013-02-13 MED ORDER — LISINOPRIL 5 MG PO TABS: 10.0000 mg | ORAL_TABLET | Freq: Every day | ORAL | Status: DC

## 2013-02-13 NOTE — Progress Notes (Signed)
Cough is some better improved on PPI BID.  No indigestion.  No ADE.   Hypertension:   Had been checking his BP at the pharmacy.  Usually had been ~120s/60s. Using medication without problems or lightheadedness: no Chest pain with exertion:no Edema:no Short of breath:no Average home BPs: 150s/80s on outside check.  This is higher than typical.    Due for carotid u/s f/u.  Discussed.    Meds, vitals, and allergies reviewed.   PMH and SH reviewed  ROS: See HPI.  Otherwise negative.    GEN: nad, alert and oriented HEENT: mucous membranes moist NECK: supple w/o LA CV: rrr. PULM: ctab, no inc wob ABD: soft, +bs EXT: no edema SKIN: no acute rash

## 2013-02-13 NOTE — Assessment & Plan Note (Signed)
Inc lisinopril to 10mg  a day if tolerated.  Call back prn.  He agrees.

## 2013-02-13 NOTE — Patient Instructions (Addendum)
[name] will call about your referral. Increase the lisinopril to 10mg  a day.  If you get lightheaded, then cut back to 5mg  a day.  If your BP still isn't controlled on 10mg  a day, then notify me.  Take care.  Glad to see you.

## 2013-02-13 NOTE — Assessment & Plan Note (Signed)
Recheck carotid u/s pending.

## 2013-02-27 ENCOUNTER — Other Ambulatory Visit: Payer: Self-pay | Admitting: Family Medicine

## 2013-03-20 ENCOUNTER — Encounter (INDEPENDENT_AMBULATORY_CARE_PROVIDER_SITE_OTHER): Payer: Medicare Other

## 2013-03-20 DIAGNOSIS — I6529 Occlusion and stenosis of unspecified carotid artery: Secondary | ICD-10-CM

## 2013-03-22 ENCOUNTER — Other Ambulatory Visit: Payer: Self-pay

## 2013-05-29 ENCOUNTER — Other Ambulatory Visit: Payer: Self-pay | Admitting: Family Medicine

## 2013-05-29 NOTE — Telephone Encounter (Signed)
Electronic refill request.  Please advise. 

## 2013-05-29 NOTE — Telephone Encounter (Signed)
Will refill as this was refilled last year, but will also route to PCP as fyi - ?#[phone]

## 2013-06-01 NOTE — Telephone Encounter (Signed)
If no ADE, then would continue as is.  Thanks.

## 2013-06-29 ENCOUNTER — Encounter: Payer: Self-pay | Admitting: Family Medicine

## 2013-06-29 ENCOUNTER — Ambulatory Visit (INDEPENDENT_AMBULATORY_CARE_PROVIDER_SITE_OTHER): Payer: Medicare Other | Admitting: Family Medicine

## 2013-06-29 ENCOUNTER — Ambulatory Visit (INDEPENDENT_AMBULATORY_CARE_PROVIDER_SITE_OTHER)
Admission: RE | Admit: 2013-06-29 | Discharge: 2013-06-29 | Disposition: A | Discharge status: Home / Self Care | Payer: Medicare Other | Source: Ambulatory Visit | Attending: Family Medicine | Admitting: Family Medicine

## 2013-06-29 VITALS — BP 144/72 | HR 69 | Temp 97.6°F | Wt 151.5 lb

## 2013-06-29 DIAGNOSIS — R209 Unspecified disturbances of skin sensation: Secondary | ICD-10-CM

## 2013-06-29 DIAGNOSIS — R059 Cough, unspecified: Secondary | ICD-10-CM

## 2013-06-29 DIAGNOSIS — R05 Cough: Secondary | ICD-10-CM

## 2013-06-29 DIAGNOSIS — R202 Paresthesia of skin: Secondary | ICD-10-CM

## 2013-06-29 MED ORDER — AZITHROMYCIN 250 MG PO TABS
ORAL_TABLET | ORAL | Status: DC
Start: 2013-06-29 — End: 2013-11-08

## 2013-06-29 MED ORDER — BENZONATATE 200 MG PO CAPS: 200.0000 mg | ORAL_CAPSULE | Freq: Three times a day (TID) | ORAL | Status: DC | PRN

## 2013-06-29 NOTE — Patient Instructions (Addendum)
Go to the lab on the way out.  We'll contact you with your xray report. Start the antibiotics today.  You can take tessalon for the cough.  Drink plenty of fluids and get some rest.   Try using a different pillow and see if that helps your hands.   Take care.

## 2013-06-29 NOTE — Progress Notes (Signed)
Pre-visit discussion using our clinic review tool. No additional management support is needed unless otherwise documented below in the visit note.  Sx started about 2 weeks ago.  No fevers.  Cough noted.  Not much post nasal gtt or rhinorrhea.  Chest congestion noted, frequent sputum production.  He doesn't feel ill, but is tired.  Cough is not better or worse recently.  Some dec in sputum recently.  Minimally SOB, episodically, not consistent.    Paresthesias in the arms and hands.  No sx in the feet.  Tingling and burning in B hands. Can be positional, better with change in position, does better sleeping in recliner.  No weakness.  He saw the chiropractor recently but didn't go through with the eval/tx.  It has been going on intermittently for years.  He was in cock up splints prev.  That helped some.  Sx can be in either or both arms together.    No sx currently.   Meds, vitals, and allergies reviewed.   ROS: See HPI.  Otherwise, noncontributory.  GEN: nad, alert and oriented HEENT: mucous membranes moist, tm w/o erythema, nasal exam w/o erythema, clear discharge noted,  OP with cobblestoning NECK: supple w/o LA CV: rrr.   PULM: ctab, no inc wob EXT: no edema SKIN: no acute rash B hand strength and sensation wnl. Neck ROM wnl.  DTRs wnl B.

## 2013-07-01 DIAGNOSIS — R202 Paresthesia of skin: Secondary | ICD-10-CM | POA: Insufficient documentation

## 2013-07-01 NOTE — Assessment & Plan Note (Signed)
In [place].  Positional, would have him adjust his bed pillow first as it usually happens at night in the bed.  Call back as needed.  He agrees. Normal exam today for the BUE.

## 2013-07-01 NOTE — Assessment & Plan Note (Signed)
See notes on CXR.  Would treat with zmax anyway given the duration.  He agrees. F/u prn.  Nontoxic.

## 2013-10-17 ENCOUNTER — Telehealth: Payer: Self-pay | Admitting: *Deleted

## 2013-10-17 NOTE — Telephone Encounter (Signed)
Lmom to call our office. Time to schedule carotid doppler (6 mth f/u). 

## 2013-10-18 ENCOUNTER — Telehealth: Payer: Self-pay | Admitting: Family Medicine

## 2013-10-18 NOTE — Telephone Encounter (Signed)
30 min appointment, not a Monday or Friday.  Thanks.

## 2013-10-18 NOTE — Telephone Encounter (Signed)
Pt called and needs [facility] wellness before end of June because he get some sort of bonus check if he has it done in June. You next available cpe is not until end of July  Can you accommodate?

## 2013-10-19 NOTE — Telephone Encounter (Signed)
Pt called back, states that he wants to see Dr. Damita Dunnings before he schedules this.

## 2013-10-19 NOTE — Telephone Encounter (Signed)
Scheduled 11/08/13. Thanks!

## 2013-11-08 ENCOUNTER — Encounter: Payer: Self-pay | Admitting: Family Medicine

## 2013-11-08 ENCOUNTER — Ambulatory Visit (INDEPENDENT_AMBULATORY_CARE_PROVIDER_SITE_OTHER): Payer: Medicare Other | Admitting: Family Medicine

## 2013-11-08 VITALS — BP 130/74 | HR 62 | Temp 97.8°F | Ht 68.0 in | Wt 156.8 lb

## 2013-11-08 DIAGNOSIS — Z0000 Encounter for general adult medical examination without abnormal findings: Secondary | ICD-10-CM

## 2013-11-08 DIAGNOSIS — Z7189 Other specified counseling: Secondary | ICD-10-CM

## 2013-11-08 DIAGNOSIS — I6529 Occlusion and stenosis of unspecified carotid artery: Secondary | ICD-10-CM

## 2013-11-08 DIAGNOSIS — I10 Essential (primary) hypertension: Secondary | ICD-10-CM

## 2013-11-08 DIAGNOSIS — F528 Other sexual dysfunction not due to a substance or known physiological condition: Secondary | ICD-10-CM

## 2013-11-08 DIAGNOSIS — R1310 Dysphagia, unspecified: Secondary | ICD-10-CM

## 2013-11-08 MED ORDER — SILDENAFIL CITRATE 20 MG PO TABS: ORAL_TABLET | ORAL | Status: DC

## 2013-11-08 MED ORDER — LISINOPRIL 5 MG PO TABS: ORAL_TABLET | ORAL | Status: DC

## 2013-11-08 MED ORDER — PANTOPRAZOLE SODIUM 40 MG PO TBEC: 40.0000 mg | DELAYED_RELEASE_TABLET | Freq: Every day | ORAL | Status: DC

## 2013-11-08 NOTE — Patient Instructions (Addendum)
Check with your insurance to see if they will cover the shingles shot. Think about getting a pneumonia shot.   Check on getting a repeat eye exam.  Schedule a fasting lab visit.   Call about your follow up carotid study.   Glad to see you.  Take care.

## 2013-11-08 NOTE — Progress Notes (Signed)
Pre visit review using our clinic review tool, if applicable. No additional management support is needed unless otherwise documented below in the visit note.  I have personally reviewed the Medicare Annual Wellness questionnaire and have noted 1. The patient's medical and social history 2. Their use of alcohol, tobacco or illicit drugs 3. Their current medications and supplements 4. The patient's functional ability including ADL's, fall risks, home safety risks and hearing or visual             impairment. 5. Diet and physical activities 6. Evidence for depression or mood disorders  The patients weight, height, BMI have been recorded in the chart and visual acuity is per eye clinic.  I have made referrals, counseling and provided education to the patient based review of the above and I have provided the pt with a written personalized care plan for preventive services.  See scanned forms.  Routine anticipatory guidance given to patient.  See health maintenance. Flu encouraged.  Shingles d/w pt.  Encouraged.  PNA d/w pt.  Encouraged.  Tetanus 2015 Colonoscopy not due given age.  Prostate cancer screening declined given his age.  D/w pt.  This is reasonable.  Advance directive- wife designated if patient were incapacitated.  Cognitive function addressed- see scanned forms- and if abnormal then additional documentation follows.   Hypertension:    Using medication without problems or lightheadedness: yes Chest pain with exertion:no Edema:no Short of breath:no  H/o dysphagia and swallowing well.  He'll check on fit for lower dentures but o/w doing well.  occ heartburn if PPI skipped, but o/w compliant with meds and PPI restarted in meantime.    H/o carotid disease and d/w pt.  Reasonable to recheck between now and fall 2015.    PMH and SH reviewed  Meds, vitals, and allergies reviewed.   ROS: See HPI.  Otherwise negative.    GEN: nad, alert and oriented HEENT: mucous membranes  moist NECK: supple w/o LA CV: rrr. PULM: ctab, no inc wob ABD: soft, +bs EXT: no edema SKIN: no acute rash

## 2013-11-09 DIAGNOSIS — Z0000 Encounter for general adult medical examination without abnormal findings: Secondary | ICD-10-CM | POA: Insufficient documentation

## 2013-11-09 NOTE — Assessment & Plan Note (Signed)
See scanned forms.  Routine anticipatory guidance given to patient.  See health maintenance. Flu encouraged.  Shingles d/w pt.  Encouraged.  PNA d/w pt.  Encouraged.  Tetanus 2015 Colonoscopy not due given age.  Prostate cancer screening declined given his age.  D/w pt.  This is reasonable.  Advance directive- wife designated if patient were incapacitated.  Cognitive function addressed- see scanned forms- and if abnormal then additional documentation follows.

## 2013-11-09 NOTE — Assessment & Plan Note (Signed)
Controlled and doing well on med w/o ADE.

## 2013-11-09 NOTE — Assessment & Plan Note (Signed)
Controlled, continue as is.  Return for labs.

## 2013-11-09 NOTE — Assessment & Plan Note (Signed)
He'll call about f/u study.

## 2013-11-09 NOTE — Assessment & Plan Note (Signed)
Doing well, continue PPI.

## 2013-11-13 ENCOUNTER — Other Ambulatory Visit (INDEPENDENT_AMBULATORY_CARE_PROVIDER_SITE_OTHER): Payer: Medicare Other

## 2013-11-13 DIAGNOSIS — I10 Essential (primary) hypertension: Secondary | ICD-10-CM

## 2013-11-13 DIAGNOSIS — E785 Hyperlipidemia, unspecified: Secondary | ICD-10-CM

## 2013-11-13 LAB — COMPREHENSIVE METABOLIC PANEL
ALT: 24 U/L (ref 0–53)
AST: 26 U/L (ref 0–37)
Albumin: 4.6 g/dL (ref 3.5–5.2)
Alkaline Phosphatase: 59 U/L (ref 39–117)
BUN: 18 mg/dL (ref 6–23)
CALCIUM: 9.8 mg/dL (ref 8.4–10.5)
CHLORIDE: 102 meq/L (ref 96–112)
CO2: 32 meq/L (ref 19–32)
Creatinine, Ser: 1 mg/dL (ref 0.4–1.5)
GFR: 72.5 mL/min (ref >60.00)
Glucose, Bld: 124 mg/dL — ABNORMAL HIGH (ref 70–99)
Potassium: 5 mEq/L (ref 3.5–5.1)
SODIUM: 138 meq/L (ref 135–145)
TOTAL PROTEIN: 8 g/dL (ref 6.0–8.3)
Total Bilirubin: 1.8 mg/dL — ABNORMAL HIGH (ref 0.2–1.2)

## 2013-11-13 LAB — LIPID PANEL
Cholesterol: 225 mg/dL — ABNORMAL HIGH (ref 0–200)
HDL: 40.2 mg/dL (ref >39.00)
LDL Cholesterol: 163 mg/dL — ABNORMAL HIGH (ref 0–99)
NONHDL: 184.8
Total CHOL/HDL Ratio: 6
Triglycerides: 108 mg/dL (ref 0.0–149.0)
VLDL: 21.6 mg/dL (ref 0.0–40.0)

## 2013-11-14 ENCOUNTER — Other Ambulatory Visit: Payer: Self-pay | Admitting: Family Medicine

## 2013-11-14 ENCOUNTER — Telehealth: Payer: Self-pay | Admitting: Family Medicine

## 2013-11-14 DIAGNOSIS — R739 Hyperglycemia, unspecified: Secondary | ICD-10-CM

## 2013-11-14 NOTE — Telephone Encounter (Signed)
I would get the PNA vaccine at the [facility].  He should check with insurance to see if they'll reimburse him for the shingles shot at [facility].  Thanks.

## 2013-11-14 NOTE — Telephone Encounter (Signed)
Lab results given to patient and follow-up lab appointment scheduled.

## 2013-11-14 NOTE — Telephone Encounter (Signed)
Patient returned your call.

## 2013-11-14 NOTE — Telephone Encounter (Signed)
Patient wanted to let Dr. Damita Dunnings know that he has checked on prices for immunizations and found out that the shingles vaccine at [facility] is $245 and [facility] does not give. Patient stated that the pneumonia vaccine is $165 at [facility] and $37 at [place]. Patient stated that he was asked to check on these and report back.

## 2013-11-15 NOTE — Telephone Encounter (Signed)
Patient advised.   He states he will get the pneumonia vaccine at [facility].  He says his insurance will not reimburse him for the shingles vaccine.  I advised him that if he gets these vaccines, to please let us know so that we can include them in his chart.  He agreed.

## 2013-12-07 ENCOUNTER — Telehealth: Payer: Self-pay

## 2013-12-07 NOTE — Telephone Encounter (Signed)
Pt left note wanting to know what was the compression for the support hose. Pt request cb.

## 2013-12-09 NOTE — Telephone Encounter (Signed)
10-20 mmHg should be enough. Thanks.

## 2013-12-10 NOTE — Telephone Encounter (Signed)
Patient advised.

## 2013-12-26 ENCOUNTER — Telehealth: Payer: Self-pay

## 2013-12-26 NOTE — Telephone Encounter (Signed)
i received response from insurance stating that the medication is denied and not covered under pt's plan--they gave alternative--has nothing to do with quantity limit etc--however you can do an appeal--i will place denial letter in your box for you to review--

## 2013-12-26 NOTE — Telephone Encounter (Signed)
PA has been submitted through covermymeds--#90 per 30 day supply is within quanity limit per formulary

## 2013-12-27 NOTE — Telephone Encounter (Signed)
Notify pt that it was denied. Thanks.

## 2013-12-31 NOTE — Telephone Encounter (Signed)
Patient advised.

## 2013-12-31 NOTE — Telephone Encounter (Signed)
Spoke to pt's wife per HIPAA and let her know insurance would not cover at all-- she requested to call him back and tell pt--i advised her to have pt call at his convenience

## 2014-01-01 ENCOUNTER — Other Ambulatory Visit (HOSPITAL_COMMUNITY): Payer: Self-pay | Admitting: *Deleted

## 2014-01-01 DIAGNOSIS — I6529 Occlusion and stenosis of unspecified carotid artery: Secondary | ICD-10-CM

## 2014-01-10 NOTE — Telephone Encounter (Addendum)
[name] with [facility] prior auth dept left v/m request cb at [phone] about prior auth for Silenfil.

## 2014-01-10 NOTE — Telephone Encounter (Signed)
I tried to return this call and held on for a long time.  The recording kept saying that due to higher than anticipated call volume, there was an extended wait.

## 2014-01-29 ENCOUNTER — Encounter (INDEPENDENT_AMBULATORY_CARE_PROVIDER_SITE_OTHER): Payer: Medicare Other

## 2014-01-29 DIAGNOSIS — I6529 Occlusion and stenosis of unspecified carotid artery: Secondary | ICD-10-CM

## 2014-02-12 NOTE — Telephone Encounter (Signed)
Pt called re: status of prior auth. I informed pt that it had been denied. He has paid out of pocket for med, and he plans to [employer].

## 2014-02-15 ENCOUNTER — Telehealth: Payer: Self-pay | Admitting: Family Medicine

## 2014-02-15 ENCOUNTER — Other Ambulatory Visit (INDEPENDENT_AMBULATORY_CARE_PROVIDER_SITE_OTHER): Payer: Medicare Other

## 2014-02-15 DIAGNOSIS — R739 Hyperglycemia, unspecified: Secondary | ICD-10-CM

## 2014-02-15 DIAGNOSIS — E785 Hyperlipidemia, unspecified: Secondary | ICD-10-CM

## 2014-02-15 LAB — GLUCOSE, RANDOM: Glucose, Bld: 100 mg/dL — ABNORMAL HIGH (ref 70–99)

## 2014-02-15 LAB — HEMOGLOBIN A1C: HEMOGLOBIN A1C: 5.9 % (ref 4.6–6.5)

## 2014-02-15 NOTE — Telephone Encounter (Signed)
Phone number for [facility] [phone] Member [phone] [facility] # [phone]

## 2014-02-15 NOTE — Telephone Encounter (Signed)
Pt is prescribed Silenfil which requires prior auth with his insurance and pt states that he has been having to pay out of pocket bc he has been requesting auth but has not been hearing anything back from our office. He would like to give [place] a new phone number to contact [facility] to give auth so he will call back in to give that number but he did want to see if there was anything else that he can do to help with this?

## 2014-02-19 ENCOUNTER — Encounter: Payer: Self-pay | Admitting: *Deleted

## 2014-03-20 ENCOUNTER — Ambulatory Visit (INDEPENDENT_AMBULATORY_CARE_PROVIDER_SITE_OTHER): Payer: Medicare Other

## 2014-03-20 DIAGNOSIS — Z23 Encounter for immunization: Secondary | ICD-10-CM

## 2014-06-19 ENCOUNTER — Telehealth: Payer: Self-pay | Admitting: Family Medicine

## 2014-06-19 NOTE — Telephone Encounter (Signed)
Form is on your desk.

## 2014-06-19 NOTE — Telephone Encounter (Signed)
Pt dropped off form to be signed He stated he has appointment tomorrow 2/4 @ 4 Form needs to be faxed by then  Please let him know when this has been faxed On lugene's desk

## 2014-06-20 NOTE — Telephone Encounter (Signed)
Form done, please fax.  Thanks.  

## 2014-06-20 NOTE — Telephone Encounter (Signed)
Form faxed and scanned.

## 2014-11-11 ENCOUNTER — Other Ambulatory Visit: Payer: Self-pay

## 2014-12-07 ENCOUNTER — Other Ambulatory Visit: Payer: Self-pay | Admitting: Family Medicine

## 2014-12-11 ENCOUNTER — Telehealth: Payer: Self-pay

## 2014-12-11 ENCOUNTER — Other Ambulatory Visit: Payer: Self-pay | Admitting: Family Medicine

## 2014-12-11 ENCOUNTER — Other Ambulatory Visit (INDEPENDENT_AMBULATORY_CARE_PROVIDER_SITE_OTHER): Payer: Medicare Other

## 2014-12-11 DIAGNOSIS — I10 Essential (primary) hypertension: Secondary | ICD-10-CM

## 2014-12-11 LAB — COMPREHENSIVE METABOLIC PANEL
ALBUMIN: 4.4 g/dL (ref 3.5–5.2)
ALT: 16 U/L (ref 0–53)
AST: 19 U/L (ref 0–37)
Alkaline Phosphatase: 71 U/L (ref 39–117)
BUN: 13 mg/dL (ref 6–23)
CALCIUM: 9.6 mg/dL (ref 8.4–10.5)
CHLORIDE: 104 meq/L (ref 96–112)
CO2: 27 meq/L (ref 19–32)
CREATININE: 0.86 mg/dL (ref 0.40–1.50)
GFR: 89.06 mL/min (ref >60.00)
GLUCOSE: 105 mg/dL — AB (ref 70–99)
Potassium: 4.4 mEq/L (ref 3.5–5.1)
SODIUM: 138 meq/L (ref 135–145)
TOTAL PROTEIN: 7.3 g/dL (ref 6.0–8.3)
Total Bilirubin: 1.5 mg/dL — ABNORMAL HIGH (ref 0.2–1.2)

## 2014-12-11 LAB — LIPID PANEL
CHOL/HDL RATIO: 5
Cholesterol: 193 mg/dL (ref 0–200)
HDL: 40.2 mg/dL (ref >39.00)
LDL Cholesterol: 131 mg/dL — ABNORMAL HIGH (ref 0–99)
NonHDL: 152.8
Triglycerides: 111 mg/dL (ref 0.0–149.0)
VLDL: 22.2 mg/dL (ref 0.0–40.0)

## 2014-12-11 NOTE — Telephone Encounter (Signed)
Pt received letter from [place] [facility] for pt to receive pneumococcal vaccine. Which pneumonia vaccine should pt get first.pt does not need cb; pt has appt to see Dr Damita Dunnings on 12/13/14 for wellness exam.

## 2014-12-11 NOTE — Telephone Encounter (Signed)
Will d/w pt at [facility].  Thanks.

## 2014-12-13 ENCOUNTER — Ambulatory Visit (INDEPENDENT_AMBULATORY_CARE_PROVIDER_SITE_OTHER): Payer: Medicare Other | Admitting: Family Medicine

## 2014-12-13 ENCOUNTER — Encounter: Payer: Self-pay | Admitting: Family Medicine

## 2014-12-13 VITALS — BP 122/60 | HR 60 | Temp 98.3°F | Ht 68.0 in | Wt 148.5 lb

## 2014-12-13 DIAGNOSIS — R0982 Postnasal drip: Secondary | ICD-10-CM

## 2014-12-13 DIAGNOSIS — L989 Disorder of the skin and subcutaneous tissue, unspecified: Secondary | ICD-10-CM

## 2014-12-13 DIAGNOSIS — Z23 Encounter for immunization: Secondary | ICD-10-CM

## 2014-12-13 DIAGNOSIS — Z0000 Encounter for general adult medical examination without abnormal findings: Secondary | ICD-10-CM

## 2014-12-13 DIAGNOSIS — R202 Paresthesia of skin: Secondary | ICD-10-CM

## 2014-12-13 MED ORDER — FLUTICASONE PROPIONATE 50 MCG/ACT NA SUSP: 2.0000 | Freq: Every day | NASAL | Status: DC

## 2014-12-13 MED ORDER — PANTOPRAZOLE SODIUM 40 MG PO TBEC: 40.0000 mg | DELAYED_RELEASE_TABLET | Freq: Every day | ORAL | Status: DC

## 2014-12-13 MED ORDER — TRIAMCINOLONE ACETONIDE 0.1 % EX CREA: 1.0000 "application " | TOPICAL_CREAM | Freq: Two times a day (BID) | CUTANEOUS | Status: DC

## 2014-12-13 MED ORDER — LISINOPRIL 5 MG PO TABS: 5.0000 mg | ORAL_TABLET | Freq: Every day | ORAL | Status: DC

## 2014-12-13 NOTE — Patient Instructions (Signed)
Keep using the splints for now.  Daytime use may help.  If not any better, then let me know.  Use flonase- OTC- for the post nasal drip. See if that helps.  Use the cream as needed on your groin for the itching. Take care.  Glad to see you.

## 2014-12-13 NOTE — Progress Notes (Signed)
Pre visit review using our clinic review tool, if applicable. No additional management support is needed unless otherwise documented below in the visit note.  I have personally reviewed the Medicare Annual Wellness questionnaire and have noted 1. The patient's medical and social history 2. Their use of alcohol, tobacco or illicit drugs 3. Their current medications and supplements 4. The patient's functional ability including ADL's, fall risks, home safety risks and hearing or visual             impairment. 5. Diet and physical activities 6. Evidence for depression or mood disorders  The patients weight, height, BMI have been recorded in the chart and visual acuity is per eye clinic.  I have made referrals, counseling and provided education to the patient based review of the above and I have provided the pt with a written personalized care plan for preventive services.  Provider list updated- see scanned forms.  Routine anticipatory guidance given to patient.  See health maintenance.  Flu encouraged.  Shingles d/w pt. Encouraged.  PNA d/w pt. Encouraged.  Tetanus 2015 Colonoscopy not due given age. He agrees.  Prostate cancer screening declined given his age. D/w pt. This is reasonable.  Advance directive- wife designated if patient were incapacitated.  Cognitive function addressed- see scanned forms- and if abnormal then additional documentation follows.   Post nasal gtt noted.  Worse at night.  Some throat irritation from the drip.  No help with mucinex.  Hadn't tried other meds.    Blistered skin the groin, on the scrotum.  Had some local irritation that got worse, then blistered.  Had been itching some.  Not painful.    L>R hand tingling.  Worse with certain positions, can be relieved in other positions.  Using splints at night, not in the day.  Sx are intermittent.  Long standing.  No weakness.   PMH and SH reviewed  Meds, vitals, and allergies reviewed.   ROS: See HPI.   Otherwise negative.    GEN: nad, alert and oriented HEENT: mucous membranes moist, nasal exam irritated, some mid post nasal gtt and irritation noted in the throat.  NECK: supple w/o LA CV: rrr. PULM: ctab, no inc wob ABD: soft, +bs EXT: no edema SKIN: no acute rash except for irritated area on the L side of the scrotum, about 1 cm, no other skin lesions noted in the area.  Testicles not ttp.  B grip wnl but tinel pos B.

## 2014-12-15 DIAGNOSIS — R202 Paresthesia of skin: Secondary | ICD-10-CM | POA: Insufficient documentation

## 2014-12-15 DIAGNOSIS — R0982 Postnasal drip: Secondary | ICD-10-CM | POA: Insufficient documentation

## 2014-12-15 DIAGNOSIS — L989 Disorder of the skin and subcutaneous tissue, unspecified: Secondary | ICD-10-CM

## 2014-12-15 DIAGNOSIS — R21 Rash and other nonspecific skin eruption: Secondary | ICD-10-CM | POA: Insufficient documentation

## 2014-12-15 NOTE — Assessment & Plan Note (Signed)
Use flonase and update me as needed.

## 2014-12-15 NOTE — Assessment & Plan Note (Signed)
Likely CTS, d/w pt.  Would try day splinting and see if that helps.  Update me as needed.  He agrees.

## 2014-12-15 NOTE — Assessment & Plan Note (Signed)
Doesn't appear infected, use topical TAC and f/u prn.  He agrees.

## 2014-12-15 NOTE — Assessment & Plan Note (Signed)
Flu encouraged.  Shingles d/w pt. Encouraged.  PNA d/w pt. Encouraged.  Tetanus 2015 Colonoscopy not due given age. He agrees.  Prostate cancer screening declined given his age. D/w pt. This is reasonable.  Advance directive- wife designated if patient were incapacitated.  Cognitive function addressed- see scanned forms- and if abnormal then additional documentation follows.

## 2015-01-31 ENCOUNTER — Telehealth: Payer: Self-pay

## 2015-01-31 ENCOUNTER — Telehealth: Payer: Self-pay | Admitting: *Deleted

## 2015-01-31 NOTE — Telephone Encounter (Signed)
Pt left v/m; pt received call from cardiology to repeat US of carotid artery as one yr f/u. Pt did not discuss at 12/13/14 annual exam with dr Damita Dunnings and pt wants to know if Dr Damita Dunnings thinks pt needs to have US done. Pt request cb.

## 2015-01-31 NOTE — Telephone Encounter (Signed)
Lmom for patient to call our office regarding an appt.  Patient need a carotid u/s (1 yr f/u). °

## 2015-02-01 NOTE — Telephone Encounter (Signed)
It is worth going for the test if the results could potentially change mgmt.  If the patient had a lot of narrowing in the last year that would make him at higher risk for a CVA/etc, then know that would potentially be useful to know.  Given that the test is noninvasive, I would lean toward getting it done.  He does have the final authority to approve/refuse, and I'll respectfully defer to him.  Thanks.

## 2015-02-03 NOTE — Telephone Encounter (Signed)
Scheduled patient 09/20 at 930.

## 2015-02-03 NOTE — Telephone Encounter (Signed)
Patient notified as instructed by telephone and verbalized understanding. FYI--   Patient said that he is going to have the test done.

## 2015-02-03 NOTE — Telephone Encounter (Signed)
Noted, thanks!

## 2015-02-04 ENCOUNTER — Ambulatory Visit (INDEPENDENT_AMBULATORY_CARE_PROVIDER_SITE_OTHER): Payer: Medicare Other

## 2015-02-04 ENCOUNTER — Other Ambulatory Visit: Payer: Self-pay | Admitting: Family Medicine

## 2015-02-04 DIAGNOSIS — I6523 Occlusion and stenosis of bilateral carotid arteries: Secondary | ICD-10-CM

## 2015-02-04 DIAGNOSIS — R202 Paresthesia of skin: Secondary | ICD-10-CM

## 2015-02-24 ENCOUNTER — Other Ambulatory Visit: Payer: Self-pay | Admitting: *Deleted

## 2015-02-24 NOTE — Telephone Encounter (Signed)
Received faxed refill request from pharmacy Last refill 11/08/13 #90/12 Last office visit 12/10/14 Is it okay to refill ?

## 2015-02-25 MED ORDER — SILDENAFIL CITRATE 20 MG PO TABS: ORAL_TABLET | ORAL | Status: DC

## 2015-02-25 NOTE — Telephone Encounter (Signed)
Sent. Thanks.   

## 2015-05-29 ENCOUNTER — Ambulatory Visit (INDEPENDENT_AMBULATORY_CARE_PROVIDER_SITE_OTHER): Payer: Medicare Other | Admitting: Family Medicine

## 2015-05-29 ENCOUNTER — Encounter: Payer: Self-pay | Admitting: Family Medicine

## 2015-05-29 ENCOUNTER — Telehealth: Payer: Self-pay | Admitting: Family Medicine

## 2015-05-29 VITALS — BP 118/52 | HR 63 | Temp 97.3°F | Wt 152.8 lb

## 2015-05-29 DIAGNOSIS — R2231 Localized swelling, mass and lump, right upper limb: Secondary | ICD-10-CM | POA: Diagnosis not present

## 2015-05-29 DIAGNOSIS — R2230 Localized swelling, mass and lump, unspecified upper limb: Secondary | ICD-10-CM | POA: Insufficient documentation

## 2015-05-29 NOTE — Telephone Encounter (Signed)
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 Nurse Assessment Nurse: Ronnald Ramp, RN, Miranda Date/Time (Eastern Time): 05/29/2015 11:00:49 AM Confirm and document reason for call. If symptomatic, describe symptoms. ---Caller states he believes he had an aneursym in his right arm. He has a place about the size of a dime that is raised. The bump is skin colored and not painful. Has the patient traveled out of the country within the last 30 days? ---Not Applicable Does the patient have any new or worsening symptoms? ---Yes Will a triage be completed? ---Yes Related visit to physician within the last 2 weeks? ---No Does the PT have any chronic conditions? (i.e. diabetes, asthma, etc.) ---Yes List chronic conditions. ---GERD, HTN Is this a behavioral health or substance abuse call? ---No Guidelines Guideline Title Affirmed Question Affirmed Notes Skin Lump or Localized Swelling [1] Small swelling or lump AND [2] unexplained AND [3] present < 1 week (all triage qestions negative) Final Disposition User [place], RN, Miranda Comments No appts available today with PCP. Appt scheduled for today at 5:15 with Dr. Danise Mina. Referrals REFERRED TO PCP OFFICE REFERRED TO PCP OFFICE Disagree/Comply: Comply

## 2015-05-29 NOTE — Telephone Encounter (Signed)
Pt has appt 05/29/15 at 5:15 with Dr Darnell Level.

## 2015-05-29 NOTE — Telephone Encounter (Signed)
Noted. Thanks.

## 2015-05-29 NOTE — Assessment & Plan Note (Signed)
Anticipate benign LN vs ?scarred nodule. No constitutional or systemic sxs. No other LAD. Advised monitor for now, update [place] if enlarging or new lumps develop.

## 2015-05-29 NOTE — Patient Instructions (Signed)
I think this is benign skin lump - should go away on its own. Watch for enlargement or new lumps developing -if this happens please return to see [place].

## 2015-05-29 NOTE — Progress Notes (Signed)
Pre visit review using our clinic review tool, if applicable. No additional management support is needed unless otherwise documented below in the visit note. 

## 2015-05-29 NOTE — Progress Notes (Signed)
BP 118/52 mmHg  Pulse 63  Temp(Src) 97.3 F (36.3 C) (Oral)  Wt 152 lb 12 oz (69.287 kg)  SpO2 96%   CC: arm lump  Subjective:    Patient ID: [name], male    DOB: 07-Jun-1923, 82 y.o.   MRN: [id number]  HPI: JEFFRIE DICOLA is a 82 y.o. male presenting on 05/29/2015 for Unexplained lump on arm   Yesterday noticed lump in right dorsal forearm, went away overnight then returned this afternoon.   Also has lump more proximally right arm that hasn't changed.   No other skin lump. No swollen glands, easy bruising/bleeding, weight loss, night sweats or fevers/chills, fatigue or malaise.   Relevant past medical, surgical, family and social history reviewed and updated as indicated. Interim medical history since our last visit reviewed. Allergies and medications reviewed and updated. Current Outpatient Prescriptions on File Prior to Visit  Medication Sig  . aspirin 81 MG tablet Take 81 mg by mouth daily.  Marland Kitchen lisinopril (PRINIVIL,ZESTRIL) 5 MG tablet Take 1 tablet (5 mg total) by mouth daily.  . pantoprazole (PROTONIX) 40 MG tablet Take 1 tablet (40 mg total) by mouth daily.  . sildenafil (REVATIO) 20 MG tablet TAKE TWO OR THREE TABLETS ONE HOUR PRIOR TO RELATIONS AS NEEDED--LIMIT TO ONCE PER DAY  . Wheat Dextrin (BENEFIBER PO) Take by mouth daily.   No current facility-administered medications on file prior to visit.    Review of Systems Per HPI unless specifically indicated in ROS section     Objective:    BP 118/52 mmHg  Pulse 63  Temp(Src) 97.3 F (36.3 C) (Oral)  Wt 152 lb 12 oz (69.287 kg)  SpO2 96%  Wt Readings from Last 3 Encounters:  05/29/15 152 lb 12 oz (69.287 kg)  12/13/14 148 lb 8 oz (67.359 kg)  11/08/13 156 lb 12 oz (71.101 kg)    Physical Exam  Constitutional: He appears well-developed and well-nourished. No distress.  Musculoskeletal: He exhibits no edema.  Lymphadenopathy:       Head (right side): No submental, no submandibular, no tonsillar, no  preauricular and no posterior auricular adenopathy present.       Head (left side): No submental, no submandibular, no tonsillar, no preauricular and no posterior auricular adenopathy present.    He has no cervical adenopathy.    He has no axillary adenopathy.       Right axillary: No lateral adenopathy present.       Left axillary: No lateral adenopathy present.      Right: No supraclavicular and no epitrochlear adenopathy present.       Left: No supraclavicular and no epitrochlear adenopathy present.  Skin: Skin is warm and dry. No rash noted. No erythema.  Dorsal right forearm with small ~88mm lump under skin - firm, rubbery, mobile Small lipoma R upper arm.  Nursing note and vitals reviewed.  Results for orders placed or performed in visit on 12/11/14  Comprehensive metabolic panel  Result Value Ref Range   Sodium 138 135 - 145 mEq/L   Potassium 4.4 3.5 - 5.1 mEq/L   Chloride 104 96 - 112 mEq/L   CO2 27 19 - 32 mEq/L   Glucose, Bld 105 (H) 70 - 99 mg/dL   BUN 13 6 - 23 mg/dL   Creatinine, Ser 0.86 0.40 - 1.50 mg/dL   Total Bilirubin 1.5 (H) 0.2 - 1.2 mg/dL   Alkaline Phosphatase 71 39 - 117 U/L   AST 19 0 -  37 U/L   ALT 16 0 - 53 U/L   Total Protein 7.3 6.0 - 8.3 g/dL   Albumin 4.4 3.5 - 5.2 g/dL   Calcium 9.6 8.4 - 10.5 mg/dL   GFR 89.06 >60.00 mL/min  Lipid panel  Result Value Ref Range   Cholesterol 193 0 - 200 mg/dL   Triglycerides 111.0 0.0 - 149.0 mg/dL   HDL 40.20 >39.00 mg/dL   VLDL 22.2 0.0 - 40.0 mg/dL   LDL Cholesterol 131 (H) 0 - 99 mg/dL   Total CHOL/HDL Ratio 5    NonHDL 152.80    Lab Results  Component Value Date   WBC 13.1* 09/08/2012   HGB 14.6 09/08/2012   HCT 43.3 09/08/2012   MCV 93.9 09/08/2012   PLT 177.0 09/08/2012       Assessment & Plan:   Problem List Items Addressed This Visit    Skin lump of arm - Primary    Anticipate benign LN vs ?scarred nodule. No constitutional or systemic sxs. No other LAD. Advised monitor for now, update [place]  if enlarging or new lumps develop.          Follow up plan: Return if symptoms worsen or fail to improve.

## 2015-12-10 ENCOUNTER — Other Ambulatory Visit: Payer: Self-pay | Admitting: Family Medicine

## 2015-12-10 DIAGNOSIS — E785 Hyperlipidemia, unspecified: Secondary | ICD-10-CM

## 2015-12-15 ENCOUNTER — Encounter: Payer: Self-pay | Admitting: Family Medicine

## 2015-12-15 ENCOUNTER — Telehealth: Payer: Self-pay | Admitting: Family Medicine

## 2015-12-15 ENCOUNTER — Other Ambulatory Visit (INDEPENDENT_AMBULATORY_CARE_PROVIDER_SITE_OTHER): Payer: Medicare Other

## 2015-12-15 ENCOUNTER — Ambulatory Visit (INDEPENDENT_AMBULATORY_CARE_PROVIDER_SITE_OTHER): Payer: Medicare Other | Admitting: Family Medicine

## 2015-12-15 VITALS — BP 140/70 | HR 49 | Temp 97.4°F | Wt 149.2 lb

## 2015-12-15 DIAGNOSIS — K222 Esophageal obstruction: Secondary | ICD-10-CM

## 2015-12-15 DIAGNOSIS — Z0000 Encounter for general adult medical examination without abnormal findings: Secondary | ICD-10-CM | POA: Diagnosis not present

## 2015-12-15 DIAGNOSIS — I10 Essential (primary) hypertension: Secondary | ICD-10-CM

## 2015-12-15 DIAGNOSIS — R2231 Localized swelling, mass and lump, right upper limb: Secondary | ICD-10-CM

## 2015-12-15 DIAGNOSIS — Q394 Esophageal web: Secondary | ICD-10-CM | POA: Diagnosis not present

## 2015-12-15 DIAGNOSIS — E785 Hyperlipidemia, unspecified: Secondary | ICD-10-CM | POA: Diagnosis not present

## 2015-12-15 LAB — COMPREHENSIVE METABOLIC PANEL
ALBUMIN: 4.5 g/dL (ref 3.5–5.2)
ALT: 20 U/L (ref 0–53)
AST: 24 U/L (ref 0–37)
Alkaline Phosphatase: 62 U/L (ref 39–117)
BUN: 15 mg/dL (ref 6–23)
CHLORIDE: 102 meq/L (ref 96–112)
CO2: 28 meq/L (ref 19–32)
CREATININE: 0.99 mg/dL (ref 0.40–1.50)
Calcium: 9.8 mg/dL (ref 8.4–10.5)
GFR: 75.53 mL/min (ref >60.00)
GLUCOSE: 95 mg/dL (ref 70–99)
Potassium: 4.3 mEq/L (ref 3.5–5.1)
Sodium: 138 mEq/L (ref 135–145)
Total Bilirubin: 1.5 mg/dL — ABNORMAL HIGH (ref 0.2–1.2)
Total Protein: 7.8 g/dL (ref 6.0–8.3)

## 2015-12-15 LAB — LIPID PANEL
CHOL/HDL RATIO: 4
Cholesterol: 191 mg/dL (ref 0–200)
HDL: 45 mg/dL (ref >39.00)
LDL CALC: 130 mg/dL — AB (ref 0–99)
NONHDL: 146.01
Triglycerides: 81 mg/dL (ref 0.0–149.0)
VLDL: 16.2 mg/dL (ref 0.0–40.0)

## 2015-12-15 MED ORDER — SILDENAFIL CITRATE 20 MG PO TABS: ORAL_TABLET | ORAL | 12 refills | Status: DC

## 2015-12-15 MED ORDER — PANTOPRAZOLE SODIUM 40 MG PO TBEC: 40.0000 mg | DELAYED_RELEASE_TABLET | Freq: Every day | ORAL | 3 refills | Status: DC

## 2015-12-15 MED ORDER — LISINOPRIL 5 MG PO TABS: 5.0000 mg | ORAL_TABLET | Freq: Every day | ORAL | 3 refills | Status: DC

## 2015-12-15 NOTE — Assessment & Plan Note (Signed)
Flu 2016 at pharmacy Shingles d/w pt, encouraged PNA prev done, PNA 23 prev done per patient report Tetanus 2015 Colonoscopy NA due to age, he agrees.  Prostate cancer screening NA due to age, he agrees.  Advance directive- wife designated if patient were incapacitated.  Cognitive function addressed- see scanned forms- and if abnormal then additional documentation follows.

## 2015-12-15 NOTE — Patient Instructions (Signed)
Don't change your meds for now.  We'll be in touch.  Take care.  Glad to see you.  Update me as needed.

## 2015-12-15 NOTE — Progress Notes (Signed)
Pre visit review using our clinic review tool, if applicable. No additional management support is needed unless otherwise documented below in the visit note. 

## 2015-12-15 NOTE — Assessment & Plan Note (Signed)
Would follow clinically.  Wouldn't need excision at this point, small, likely benign, he can get second opinion from derm if needed. He agrees.

## 2015-12-15 NOTE — Telephone Encounter (Signed)
Call pt. I checked on cream patient was asking about.  Sunsum+.  Likely useless, likely not reliable.  I wouldn't use it.  Thanks.

## 2015-12-15 NOTE — Progress Notes (Signed)
I have personally reviewed the Medicare Annual Wellness questionnaire and have noted 1. The patient's medical and social history 2. Their use of alcohol, tobacco or illicit drugs 3. Their current medications and supplements 4. The patient's functional ability including ADL's, fall risks, home safety risks and hearing or visual             impairment. 5. Diet and physical activities 6. Evidence for depression or mood disorders  The patients weight, height, BMI have been recorded in the chart and visual acuity is per eye clinic.  I have made referrals, counseling and provided education to the patient based review of the above and I have provided the pt with a written personalized care plan for preventive services.  Provider list updated- see scanned forms.  Routine anticipatory guidance given to patient.  See health maintenance.  Flu 2016 at pharmacy Shingles d/w pt, encouraged PNA prev done, PNA 23 prev done per patient report Tetanus 2015 Colonoscopy NA due to age, he agrees.  Prostate cancer screening NA due to age, he agrees.  Advance directive- wife designated if patient were incapacitated.  Cognitive function addressed- see scanned forms- and if abnormal then additional documentation follows.   Hypertension:    Using medication without problems or lightheadedness: yes Chest pain with exertion:no Edema:no Short of breath:no Labs pending, done before OV.    GERD controlled, no sx.  H/o ring s/p treatment.    PMH and SH reviewed  Meds, vitals, and allergies reviewed.   ROS: Per HPI.  Unless specifically indicated otherwise in HPI, the patient denies:  General: fever. Eyes: acute vision changes ENT: sore throat Cardiovascular: chest pain Respiratory: SOB GI: vomiting GU: dysuria Musculoskeletal: acute back pain Derm: acute rash Neuro: acute motor dysfunction Psych: worsening mood Endocrine: polydipsia Heme: bleeding Allergy: hayfever Still with some nocturnal  numbness in the hands, seems to be positional and resolved by sleeping in recliner.  Also with mass noted on R arm, prev noted by Dr. Darnell Level and no change in meantime.  GEN: nad, alert and oriented HEENT: mucous membranes moist NECK: supple w/o LA CV: rrr. PULM: ctab, no inc wob ABD: soft, +bs EXT: no edema SKIN: no acute rash but small nodule noted on R forearm, either a small LN or cyst or scar tissue, not ttp

## 2015-12-15 NOTE — Assessment & Plan Note (Signed)
Continue PPI, doing well, no ADE on med.  No GERD sx.

## 2015-12-15 NOTE — Assessment & Plan Note (Signed)
Controlled, no change in meds.  He agrees.   See notes on labs.

## 2015-12-16 NOTE — Telephone Encounter (Signed)
Left detailed message on voicemail.  

## 2016-01-19 ENCOUNTER — Telehealth: Payer: Self-pay | Admitting: Family Medicine

## 2016-01-19 DIAGNOSIS — I6529 Occlusion and stenosis of unspecified carotid artery: Secondary | ICD-10-CM

## 2016-01-19 NOTE — Telephone Encounter (Signed)
Call pt.  Reasonable to consider recheck carotids.  I put in the order.   If he is still willing to consider acting on the results if sig different from last time, ie if a lot worse, then proceed.   Thanks.

## 2016-01-20 NOTE — Telephone Encounter (Signed)
Patient advised and is willing to do the testing, will await referral coordinators call.

## 2016-02-09 ENCOUNTER — Ambulatory Visit: Payer: Medicare Other

## 2016-02-09 DIAGNOSIS — I6529 Occlusion and stenosis of unspecified carotid artery: Secondary | ICD-10-CM

## 2016-02-09 DIAGNOSIS — I6523 Occlusion and stenosis of bilateral carotid arteries: Secondary | ICD-10-CM | POA: Diagnosis not present

## 2016-03-15 ENCOUNTER — Encounter: Payer: Self-pay | Admitting: Family Medicine

## 2017-01-03 ENCOUNTER — Telehealth: Payer: Self-pay | Admitting: Family Medicine

## 2017-01-03 NOTE — Telephone Encounter (Signed)
Left pt message asking to call Ebony Hail back directly at [phone] to schedule AWV + labs with Katha Cabal and CPE with PCP.  *NOTE* Last AWV 12/15/15

## 2017-01-07 NOTE — Telephone Encounter (Signed)
Pt declined AWV with health coach

## 2017-01-13 ENCOUNTER — Encounter: Payer: Self-pay | Admitting: Family Medicine

## 2017-01-13 ENCOUNTER — Ambulatory Visit (INDEPENDENT_AMBULATORY_CARE_PROVIDER_SITE_OTHER): Payer: Medicare Other | Admitting: Family Medicine

## 2017-01-13 VITALS — BP 140/64 | HR 48 | Temp 97.5°F | Ht 68.0 in | Wt 150.2 lb

## 2017-01-13 DIAGNOSIS — F528 Other sexual dysfunction not due to a substance or known physiological condition: Secondary | ICD-10-CM

## 2017-01-13 DIAGNOSIS — Z7189 Other specified counseling: Secondary | ICD-10-CM

## 2017-01-13 DIAGNOSIS — Z0000 Encounter for general adult medical examination without abnormal findings: Secondary | ICD-10-CM | POA: Diagnosis not present

## 2017-01-13 DIAGNOSIS — R202 Paresthesia of skin: Secondary | ICD-10-CM

## 2017-01-13 DIAGNOSIS — I10 Essential (primary) hypertension: Secondary | ICD-10-CM | POA: Diagnosis not present

## 2017-01-13 DIAGNOSIS — M79606 Pain in leg, unspecified: Secondary | ICD-10-CM | POA: Diagnosis not present

## 2017-01-13 LAB — VITAMIN B12: Vitamin B-12: 424 pg/mL (ref 211–911)

## 2017-01-13 LAB — CBC WITH DIFFERENTIAL/PLATELET
Basophils Absolute: 0 10*3/uL (ref 0.0–0.1)
Basophils Relative: 0.5 % (ref 0.0–3.0)
Eosinophils Absolute: 0.3 10*3/uL (ref 0.0–0.7)
Eosinophils Relative: 5.3 % — ABNORMAL HIGH (ref 0.0–5.0)
HCT: 44.8 % (ref 39.0–52.0)
HEMOGLOBIN: 15.3 g/dL (ref 13.0–17.0)
LYMPHS ABS: 2.2 10*3/uL (ref 0.7–4.0)
Lymphocytes Relative: 44.6 % (ref 12.0–46.0)
MCHC: 34.3 g/dL (ref 30.0–36.0)
MCV: 93.8 fl (ref 78.0–100.0)
MONOS PCT: 12 % (ref 3.0–12.0)
Monocytes Absolute: 0.6 10*3/uL (ref 0.1–1.0)
NEUTROS PCT: 37.6 % — AB (ref 43.0–77.0)
Neutro Abs: 1.9 10*3/uL (ref 1.4–7.7)
Platelets: 172 10*3/uL (ref 150.0–400.0)
RBC: 4.77 Mil/uL (ref 4.22–5.81)
RDW: 13.2 % (ref 11.5–15.5)
WBC: 5 10*3/uL (ref 4.0–10.5)

## 2017-01-13 LAB — LIPID PANEL
Cholesterol: 200 mg/dL (ref 0–200)
HDL: 40.3 mg/dL (ref >39.00)
LDL Cholesterol: 140 mg/dL — ABNORMAL HIGH (ref 0–99)
NONHDL: 159.64
Total CHOL/HDL Ratio: 5
Triglycerides: 99 mg/dL (ref 0.0–149.0)
VLDL: 19.8 mg/dL (ref 0.0–40.0)

## 2017-01-13 LAB — COMPREHENSIVE METABOLIC PANEL
ALK PHOS: 62 U/L (ref 39–117)
ALT: 20 U/L (ref 0–53)
AST: 24 U/L (ref 0–37)
Albumin: 4.8 g/dL (ref 3.5–5.2)
BILIRUBIN TOTAL: 2.2 mg/dL — AB (ref 0.2–1.2)
BUN: 15 mg/dL (ref 6–23)
CO2: 31 mEq/L (ref 19–32)
Calcium: 10.1 mg/dL (ref 8.4–10.5)
Chloride: 102 mEq/L (ref 96–112)
Creatinine, Ser: 1.05 mg/dL (ref 0.40–1.50)
GFR: 70.4 mL/min (ref >60.00)
GLUCOSE: 105 mg/dL — AB (ref 70–99)
Potassium: 4.7 mEq/L (ref 3.5–5.1)
Sodium: 138 mEq/L (ref 135–145)
TOTAL PROTEIN: 7.7 g/dL (ref 6.0–8.3)

## 2017-01-13 LAB — TSH: TSH: 2.8 u[IU]/mL (ref 0.35–4.50)

## 2017-01-13 MED ORDER — PANTOPRAZOLE SODIUM 40 MG PO TBEC: 40.0000 mg | DELAYED_RELEASE_TABLET | Freq: Every day | ORAL | 3 refills | Status: DC

## 2017-01-13 MED ORDER — SILDENAFIL CITRATE 20 MG PO TABS: ORAL_TABLET | ORAL | 12 refills | Status: DC

## 2017-01-13 MED ORDER — LISINOPRIL 5 MG PO TABS: 5.0000 mg | ORAL_TABLET | Freq: Every day | ORAL | 3 refills | Status: DC

## 2017-01-13 NOTE — Patient Instructions (Addendum)
Check with your insurance to see if they will cover the shingrix shot. I would get a flu shot each fall.   Go to the lab on the way out.  We'll contact you with your lab report. Get back in the splints more often and update me if that doesn't help.  Ice your knee as needed and keep walking.  Take care.  Glad to see you.

## 2017-01-13 NOTE — Progress Notes (Signed)
I have personally reviewed the Medicare Annual Wellness questionnaire and have noted 1. The patient's medical and social history 2. Their use of alcohol, tobacco or illicit drugs 3. Their current medications and supplements 4. The patient's functional ability including ADL's, fall risks, home safety risks and hearing or visual             impairment. 5. Diet and physical activities 6. Evidence for depression or mood disorders  The patients weight, height, BMI have been recorded in the chart and visual acuity is per eye clinic.  I have made referrals, counseling and provided education to the patient based review of the above and I have provided the pt with a written personalized care plan for preventive services.  Provider list updated- see scanned forms.  Routine anticipatory guidance given to patient.  See health maintenance. The possibility exists that previously documented standard health maintenance information may have been brought forward from a previous encounter into this note.  If needed, that same information has been updated to reflect the current situation based on today's encounter.    Flu encouraged.   Shingles d/w pt, encouraged, see AVS.  PNA up to date Tetanus 2015 Colonoscopy NA due to age, he agrees.  Prostate cancer screening NA due to age, he agrees.  Advance directive- wife designated if patient were incapacitated.  Cognitive function addressed- see scanned forms- and if abnormal then additional documentation follows.   Hypertension:    Using medication without problems or lightheadedness: yes Chest pain with exertion:no Edema:no  Short of breath:no Due for labs.    Some B hand paresthesias.  Episodic tingling/numbness.  Not in the BLE, only in the BUE.  Noted more at night, less in the days.  He has been wearing wrist splints with some relief, he puts them on when the tingling starts, a little while of every day.  He has slept in them but not every night.  No neck  pain.    R leg pain recently noted, was laterally sore to the touch on the R lateral shin.  Then some pain in the R knee and R hip area.  The thigh wasn't sore.  The R knee doesn't feel as solid with weight bearing as previously but it hasn't given out. He does have some R knee pain on ROM.  All of this has been going on for about 3 weeks.  No trauma, no falls.  No L sided sx.  Nor rash, no bruising.    ED.  Taking sildenafil but limited ability to climax.  No ADE on sildenafil.    PMH and SH reviewed  Meds, vitals, and allergies reviewed.   ROS: Per HPI.  Unless specifically indicated otherwise in HPI, the patient denies:  General: fever. Eyes: acute vision changes ENT: sore throat Cardiovascular: chest pain Respiratory: SOB GI: vomiting GU: dysuria Musculoskeletal: acute back pain Derm: acute rash Neuro: acute motor dysfunction Psych: worsening mood Endocrine: polydipsia Heme: bleeding Allergy: hayfever  GEN: nad, alert and oriented HEENT: mucous membranes moist NECK: supple w/o LA, normal ROM CV: rrr. PULM: ctab, no inc wob ABD: soft, +bs EXT: no edema SKIN: no acute rash Dec monofilament sensation on the R hand compared to L, but not absent.  Normal S/S o/w in the BUE. phalen and tinel neg at the wrist B.  R knee w/o crepitus.  Able to bear weight.  Joint line not ttp Normal ROM R hip with int/ext rotation.

## 2017-01-14 DIAGNOSIS — M79606 Pain in leg, unspecified: Secondary | ICD-10-CM | POA: Insufficient documentation

## 2017-01-14 NOTE — Assessment & Plan Note (Signed)
Controlled, continue as is.  No ADE on med.  See notes on labs.

## 2017-01-14 NOTE — Assessment & Plan Note (Signed)
Normal hip ROM.  No concern for fx.  Not ttp at the knee joint line but likely with OA given his age.  D/w pt.  Unclear if gait changes relative to R knee pain and causing some discomfort in the lower lower leg and near the R hip.  Reasonable to use ice as needed, tylenol as needed, and keep walking and update me as needed.  He agrees.

## 2017-01-14 NOTE — Assessment & Plan Note (Signed)
Flu encouraged.   Shingles d/w pt, encouraged, see AVS.  PNA up to date Tetanus 2015 Colonoscopy NA due to age, he agrees.  Prostate cancer screening NA due to age, he agrees.  Advance directive- wife designated if patient were incapacitated.  Cognitive function addressed- see scanned forms- and if abnormal then additional documentation follows.

## 2017-01-14 NOTE — Assessment & Plan Note (Signed)
Continue meds as is for now.  I'll check on other med options re: his concern above.

## 2017-01-14 NOTE — Assessment & Plan Note (Signed)
Advance directive- wife designated if patient were incapacitated.  

## 2017-01-14 NOTE — Assessment & Plan Note (Signed)
phalen and tinel neg at the wrist B but the concern is still for carpal tunnel sx.  D/w pt.  No neck sx, normal strength.  Dec monofilament sensation on the R hand compared to L.  Would use splints more often and then update me as needed.  Reasonable to check basic labs re: paresthesia today.  He agrees.  Update me as needed.  No weakness in the BUE

## 2017-01-20 ENCOUNTER — Telehealth: Payer: Self-pay

## 2017-01-20 NOTE — Telephone Encounter (Signed)
Lmom for patient to call our office regarding an appt.  Patient need a carotid u/s (1 yr f/u).

## 2017-02-07 ENCOUNTER — Other Ambulatory Visit: Payer: Self-pay | Admitting: Family Medicine

## 2017-02-10 NOTE — Telephone Encounter (Signed)
3 attempts to schedule fu appt from recall list.   Mailed letter

## 2017-02-16 ENCOUNTER — Telehealth: Payer: Self-pay | Admitting: Family Medicine

## 2017-02-16 NOTE — Telephone Encounter (Signed)
Pt received a call from our office, [facility], to schedule 1 yr follow up Carotid ultrasound. Pt states asks if it is necessary for him to have this . Please call and advise.

## 2017-02-16 NOTE — Telephone Encounter (Signed)
[name] wanted to make sure that Dr. Damita Dunnings see's this message from [place] with [facility]

## 2017-02-17 NOTE — Telephone Encounter (Signed)
Left detailed message on voicemail.  

## 2017-02-17 NOTE — Telephone Encounter (Signed)
I have gotten all the messages. Technically the rec would be to recheck about this time. Given his stability, it may be reasonable to consider skipping a year. I think either way would be okay. I'll defer to his preference on this. Let me know what he thinks so I can update the EMR.  I would not fault him if he wants to skip a year.

## 2017-02-18 NOTE — Telephone Encounter (Signed)
Patient advised and would like to skip this year saying that it had not changed significantly in 10 years and he doesn't see the point in repeating this soon.

## 2017-02-20 NOTE — Telephone Encounter (Signed)
Noted, defer for now.  EMR updated for next year.  Thanks.

## 2017-02-24 ENCOUNTER — Encounter: Payer: Self-pay | Admitting: Family Medicine

## 2017-03-11 ENCOUNTER — Other Ambulatory Visit: Payer: Self-pay | Admitting: *Deleted

## 2017-03-11 DIAGNOSIS — I6523 Occlusion and stenosis of bilateral carotid arteries: Secondary | ICD-10-CM

## 2017-09-23 ENCOUNTER — Other Ambulatory Visit: Payer: Self-pay

## 2017-09-23 ENCOUNTER — Observation Stay
Admit: 2017-09-23 | Discharge: 2017-09-23 | Disposition: A | Discharge status: Home / Self Care | Payer: Medicare Other | Attending: Internal Medicine | Admitting: Internal Medicine

## 2017-09-23 ENCOUNTER — Observation Stay
Admission: EM | Admit: 2017-09-23 | Discharge: 2017-09-24 | Disposition: A | Discharge status: Home / Self Care | Payer: Medicare Other | Attending: Internal Medicine | Admitting: Internal Medicine

## 2017-09-23 ENCOUNTER — Observation Stay: Payer: Medicare Other

## 2017-09-23 ENCOUNTER — Emergency Department: Payer: Medicare Other

## 2017-09-23 DIAGNOSIS — N529 Male erectile dysfunction, unspecified: Secondary | ICD-10-CM | POA: Insufficient documentation

## 2017-09-23 DIAGNOSIS — I739 Peripheral vascular disease, unspecified: Secondary | ICD-10-CM | POA: Diagnosis not present

## 2017-09-23 DIAGNOSIS — E785 Hyperlipidemia, unspecified: Secondary | ICD-10-CM | POA: Diagnosis not present

## 2017-09-23 DIAGNOSIS — R55 Syncope and collapse: Principal | ICD-10-CM | POA: Insufficient documentation

## 2017-09-23 DIAGNOSIS — I6523 Occlusion and stenosis of bilateral carotid arteries: Secondary | ICD-10-CM | POA: Insufficient documentation

## 2017-09-23 DIAGNOSIS — Z79899 Other long term (current) drug therapy: Secondary | ICD-10-CM | POA: Insufficient documentation

## 2017-09-23 DIAGNOSIS — Z7982 Long term (current) use of aspirin: Secondary | ICD-10-CM | POA: Insufficient documentation

## 2017-09-23 DIAGNOSIS — J439 Emphysema, unspecified: Secondary | ICD-10-CM | POA: Diagnosis not present

## 2017-09-23 DIAGNOSIS — K219 Gastro-esophageal reflux disease without esophagitis: Secondary | ICD-10-CM | POA: Diagnosis not present

## 2017-09-23 DIAGNOSIS — S0081XA Abrasion of other part of head, initial encounter: Secondary | ICD-10-CM | POA: Insufficient documentation

## 2017-09-23 DIAGNOSIS — R739 Hyperglycemia, unspecified: Secondary | ICD-10-CM | POA: Insufficient documentation

## 2017-09-23 DIAGNOSIS — Z888 Allergy status to other drugs, medicaments and biological substances status: Secondary | ICD-10-CM | POA: Insufficient documentation

## 2017-09-23 DIAGNOSIS — N400 Enlarged prostate without lower urinary tract symptoms: Secondary | ICD-10-CM | POA: Insufficient documentation

## 2017-09-23 DIAGNOSIS — W19XXXA Unspecified fall, initial encounter: Secondary | ICD-10-CM | POA: Insufficient documentation

## 2017-09-23 DIAGNOSIS — Z8673 Personal history of transient ischemic attack (TIA), and cerebral infarction without residual deficits: Secondary | ICD-10-CM | POA: Insufficient documentation

## 2017-09-23 DIAGNOSIS — Z8249 Family history of ischemic heart disease and other diseases of the circulatory system: Secondary | ICD-10-CM | POA: Insufficient documentation

## 2017-09-23 DIAGNOSIS — Z87891 Personal history of nicotine dependence: Secondary | ICD-10-CM | POA: Insufficient documentation

## 2017-09-23 DIAGNOSIS — E860 Dehydration: Secondary | ICD-10-CM | POA: Diagnosis not present

## 2017-09-23 DIAGNOSIS — I10 Essential (primary) hypertension: Secondary | ICD-10-CM | POA: Insufficient documentation

## 2017-09-23 LAB — CBC WITH DIFFERENTIAL/PLATELET
BASOS ABS: 0 10*3/uL (ref 0–0.1)
Basophils Relative: 1 %
EOS ABS: 0.2 10*3/uL (ref 0–0.7)
Eosinophils Relative: 4 %
HCT: 41.2 % (ref 40.0–52.0)
HEMOGLOBIN: 14.5 g/dL (ref 13.0–18.0)
LYMPHS ABS: 2.4 10*3/uL (ref 1.0–3.6)
Lymphocytes Relative: 43 %
MCH: 32.8 pg (ref 26.0–34.0)
MCHC: 35.1 g/dL (ref 32.0–36.0)
MCV: 93.2 fL (ref 80.0–100.0)
Monocytes Absolute: 0.5 10*3/uL (ref 0.2–1.0)
Monocytes Relative: 10 %
NEUTROS PCT: 42 %
Neutro Abs: 2.3 10*3/uL (ref 1.4–6.5)
Platelets: 155 10*3/uL (ref 150–440)
RBC: 4.42 MIL/uL (ref 4.40–5.90)
RDW: 13.3 % (ref 11.5–14.5)
WBC: 5.5 10*3/uL (ref 3.8–10.6)

## 2017-09-23 LAB — TROPONIN I
Troponin I: <0.03 ng/mL (ref <0.03)
Troponin I: <0.03 ng/mL (ref <0.03)
Troponin I: <0.03 ng/mL (ref <0.03)

## 2017-09-23 LAB — COMPREHENSIVE METABOLIC PANEL
ALK PHOS: 61 U/L (ref 38–126)
ALT: 19 U/L (ref 17–63)
ANION GAP: 7 (ref 5–15)
AST: 28 U/L (ref 15–41)
Albumin: 4.1 g/dL (ref 3.5–5.0)
BUN: 20 mg/dL (ref 6–20)
CALCIUM: 9 mg/dL (ref 8.9–10.3)
CO2: 25 mmol/L (ref 22–32)
Chloride: 106 mmol/L (ref 101–111)
Creatinine, Ser: 1.03 mg/dL (ref 0.61–1.24)
GFR calc non Af Amer: >60 mL/min (ref >60)
Glucose, Bld: 116 mg/dL — ABNORMAL HIGH (ref 65–99)
Potassium: 3.9 mmol/L (ref 3.5–5.1)
SODIUM: 138 mmol/L (ref 135–145)
Total Bilirubin: 2 mg/dL — ABNORMAL HIGH (ref 0.3–1.2)
Total Protein: 7 g/dL (ref 6.5–8.1)

## 2017-09-23 LAB — ECHOCARDIOGRAM COMPLETE
Height: 69 in
Weight: 2291.2 oz

## 2017-09-23 LAB — GLUCOSE, CAPILLARY: GLUCOSE-CAPILLARY: 106 mg/dL — AB (ref 65–99)

## 2017-09-23 MED ORDER — ASPIRIN 81 MG PO CHEW
81.0000 mg | CHEWABLE_TABLET | Freq: Every day | ORAL | Status: DC
  Administered 2017-09-23 – 2017-09-24 (×2): 81 mg via ORAL
  Filled 2017-09-23 (×2): qty 1

## 2017-09-23 MED ORDER — LISINOPRIL 5 MG PO TABS
5.0000 mg | ORAL_TABLET | Freq: Every day | ORAL | Status: DC
  Administered 2017-09-23 – 2017-09-24 (×2): 5 mg via ORAL
  Filled 2017-09-23 (×2): qty 1

## 2017-09-23 MED ORDER — BISACODYL 5 MG PO TBEC: 5.0000 mg | DELAYED_RELEASE_TABLET | Freq: Every day | ORAL | Status: DC | PRN

## 2017-09-23 MED ORDER — ONDANSETRON HCL 4 MG PO TABS: 4.0000 mg | ORAL_TABLET | Freq: Four times a day (QID) | ORAL | Status: DC | PRN

## 2017-09-23 MED ORDER — ONDANSETRON HCL 4 MG/2ML IJ SOLN: 4.0000 mg | Freq: Four times a day (QID) | INTRAMUSCULAR | Status: DC | PRN

## 2017-09-23 MED ORDER — LACTATED RINGERS IV SOLN
INTRAVENOUS | Status: AC
  Administered 2017-09-23 (×2): via INTRAVENOUS

## 2017-09-23 MED ORDER — SODIUM CHLORIDE 0.9% FLUSH
3.0000 mL | Freq: Two times a day (BID) | INTRAVENOUS | Status: DC
  Administered 2017-09-24: 3 mL via INTRAVENOUS

## 2017-09-23 MED ORDER — SENNOSIDES-DOCUSATE SODIUM 8.6-50 MG PO TABS: 1.0000 | ORAL_TABLET | Freq: Every evening | ORAL | Status: DC | PRN

## 2017-09-23 MED ORDER — ACETAMINOPHEN 650 MG RE SUPP: 650.0000 mg | Freq: Four times a day (QID) | RECTAL | Status: DC | PRN

## 2017-09-23 MED ORDER — PANTOPRAZOLE SODIUM 40 MG PO TBEC
40.0000 mg | DELAYED_RELEASE_TABLET | Freq: Every day | ORAL | Status: DC
  Administered 2017-09-23 – 2017-09-24 (×2): 40 mg via ORAL
  Filled 2017-09-23 (×2): qty 1

## 2017-09-23 MED ORDER — ENOXAPARIN SODIUM 40 MG/0.4ML ~~LOC~~ SOLN
40.0000 mg | SUBCUTANEOUS | Status: DC
  Administered 2017-09-23: 40 mg via SUBCUTANEOUS
  Filled 2017-09-23: qty 0.4

## 2017-09-23 MED ORDER — ACETAMINOPHEN 325 MG PO TABS
ORAL_TABLET | ORAL | Status: AC
  Administered 2017-09-23: 650 mg
  Filled 2017-09-23: qty 2

## 2017-09-23 MED ORDER — TETANUS-DIPHTH-ACELL PERTUSSIS 5-2.5-18.5 LF-MCG/0.5 IM SUSP
0.5000 mL | Freq: Once | INTRAMUSCULAR | Status: AC
  Administered 2017-09-23: 0.5 mL via INTRAMUSCULAR
  Filled 2017-09-23: qty 0.5

## 2017-09-23 MED ORDER — ACETAMINOPHEN 325 MG PO TABS: 650.0000 mg | ORAL_TABLET | Freq: Four times a day (QID) | ORAL | Status: DC | PRN

## 2017-09-23 NOTE — Consult Note (Signed)
[name] is a 84 y.o. male  [id number]  Primary Cardiologist: Loraine Grip for Consultation: Syncope  HPI: 89 YOWM came with episode of passing out, and happened last night found on floor.  Review of Systems: NO cp   Past Medical History:  Diagnosis Date  . BPH (benign prostatic hyperplasia) 06/1997  . Hyperlipemia 1990's  . Hypertension 02/2004  . TIA (transient ischemic attack)    was not on aspirin at time of TIA per patient    Medications Prior to Admission  Medication Sig Dispense Refill  . aspirin 81 MG tablet Take 81 mg by mouth daily.    Marland Kitchen lisinopril (PRINIVIL,ZESTRIL) 5 MG tablet Take 1 tablet (5 mg total) by mouth daily. 90 tablet 3  . pantoprazole (PROTONIX) 40 MG tablet Take 1 tablet (40 mg total) by mouth daily. 90 tablet 3  . Wheat Dextrin (BENEFIBER) POWD Take 1 Dose by mouth every morning. Takes benefiber in coffee each morning       . aspirin  81 mg Oral Daily  . enoxaparin (LOVENOX) injection  40 mg Subcutaneous Q24H  . lisinopril  5 mg Oral Daily  . pantoprazole  40 mg Oral Daily  . sodium chloride flush  3 mL Intravenous Q12H    Infusions: . lactated ringers 125 mL/hr at 09/23/17 0643    Allergies  Allergen Reactions  . Atorvastatin     REACTION: PALPITATIONS  . Ezetimibe-Simvastatin     REACTION: HEADACHES  . Simvastatin     REACTION: HEADACHE    Social History   Socioeconomic History  . Marital status: Married    Spouse name: Not on file  . Number of children: Not on file  . Years of education: Not on file  . Highest education level: Not on file  Occupational History  . 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 Financial resource strain: Not on file  . Food insecurity:    Worry: Not on file    Inability: Not on file  . Transportation needs:    Medical: Not on file    Non-medical: Not on file  Tobacco Use  . Smoking status: Former Smoker    Last  attempt to quit: 05/17/1970    Years since quitting: 47.3  . Smokeless tobacco: Never Used  . Tobacco comment: quit 1972  Substance and Sexual Activity  . Alcohol use: No  . Drug use: No  . Sexual activity: Yes  Lifestyle  . Physical activity:    Days per week: Not on file    Minutes per session: Not on file  . Stress: Not on file  Relationships  . Social connections:    Talks on phone: Not on file    Gets together: Not on file    Attends religious service: Not on file    Active member of club or organization: Not on file    Attends meetings of clubs or organizations: Not on file    Relationship status: Not on file  . Intimate partner violence:    Fear of current or ex partner: Not on file    Emotionally abused: Not on file    Physically abused: Not on file    Forced sexual activity: Not on file  Other Topics Concern  . 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 Stroke Mother   . Diabetes Sister   . Heart disease Sister        CAD, AFib, GI bleed, FE deficiency  . Prostate cancer Brother   . Cancer Brother 69       lungs, smoker  . Colon cancer Neg Hx     PHYSICAL EXAM: Vitals:   09/23/17 0909 09/23/17 0911  BP: (!) 149/64 (!) 152/62  Pulse: (!) 55 63  Resp: 18 18  Temp: 97.8 F (36.6 C)   SpO2: 99% 96%     Intake/Output Summary (Last 24 hours) at 09/23/2017 1317 Last data filed at 09/23/2017 1228 Gross per 24 hour  Intake 120 ml  Output 325 ml  Net -205 ml    General:  Well appearing. No respiratory difficulty HEENT: normal Neck: supple. no JVD. Carotids 2+ bilat; no bruits. No lymphadenopathy or thryomegaly appreciated. Cor: PMI nondisplaced. Regular rate & rhythm. No rubs, gallops or murmurs. Lungs: clear Abdomen: soft, nontender, nondistended.  No hepatosplenomegaly. No bruits or masses. Good bowel sounds. Extremities: no cyanosis, clubbing, rash, edema Neuro: alert & oriented x 3, cranial nerves grossly intact. moves all 4 extremities w/o difficulty. Affect pleasant.  ECG: NSR with 1 degree av block and PVCS  Results for orders placed or performed during the hospital encounter of 09/23/17 (from the past 24 hour(s))  Comprehensive metabolic panel     Status: Abnormal   Collection Time: 09/23/17  3:50 AM  Result Value Ref Range   Sodium 138 135 - 145 mmol/L   Potassium 3.9 3.5 - 5.1 mmol/L   Chloride 106 101 - 111 mmol/L   CO2 25 22 - 32 mmol/L   Glucose, Bld 116 (H) 65 - 99 mg/dL   BUN 20 6 - 20 mg/dL   Creatinine, Ser 1.03 0.61 - 1.24 mg/dL   Calcium 9.0 8.9 - 10.3 mg/dL   Total Protein 7.0 6.5 - 8.1 g/dL   Albumin 4.1 3.5 - 5.0 g/dL   AST 28 15 - 41 U/L   ALT 19 17 - 63 U/L   Alkaline Phosphatase 61 38 - 126 U/L   Total Bilirubin 2.0 (H) 0.3 - 1.2 mg/dL   GFR calc non Af Amer >60 >60 mL/min   GFR calc Af Amer >60 >60 mL/min   Anion gap 7 5 - 15  Troponin I     Status: None   Collection Time: 09/23/17  3:50 AM  Result Value Ref Range   Troponin I <0.03 <0.03 ng/mL  CBC with Differential     Status: None   Collection Time: 09/23/17  3:50 AM  Result Value Ref Range   WBC 5.5 3.8 - 10.6 K/uL   RBC 4.42 4.40 - 5.90 MIL/uL   Hemoglobin 14.5 13.0 - 18.0 g/dL   HCT 41.2 40.0 - 52.0 %   MCV 93.2 80.0 - 100.0 fL   MCH 32.8 26.0 - 34.0 pg   MCHC 35.1 32.0 - 36.0 g/dL   RDW 13.3 11.5 - 14.5 %   Platelets 155 150 - 440 K/uL   Neutrophils Relative % 42 %   Neutro Abs 2.3 1.4 - 6.5 K/uL   Lymphocytes Relative 43 %   Lymphs Abs 2.4 1.0 - 3.6 K/uL   Monocytes Relative 10 %   Monocytes Absolute 0.5 0.2 - 1.0 K/uL  Eosinophils Relative 4 %   Eosinophils Absolute 0.2 0 - 0.7 K/uL   Basophils Relative 1 %   Basophils Absolute 0.0 0 - 0.1 K/uL  Troponin I     Status: None   Collection Time: 09/23/17 11:30 AM  Result  Value Ref Range   Troponin I <0.03 <0.03 ng/mL   Ct Head Wo Contrast  Result Date: 09/23/2017 CLINICAL DATA:  Initial evaluation for acute altered mental status. Syncope. EXAM: CT HEAD WITHOUT CONTRAST TECHNIQUE: Contiguous axial images were obtained from the base of the skull through the vertex without intravenous contrast. COMPARISON:  Prior MRI from 10/17/2006. FINDINGS: Brain: Generalized age-related cerebral atrophy with mild chronic small vessel ischemic disease. No acute intracranial hemorrhage. No acute large vessel territory infarct. No mass lesion, midline shift or mass effect. No hydrocephalus. No extra-axial fluid collection. Vascular: No hyperdense vessel. Calcified atherosclerotic change present at the skull base. Skull: No appreciable scalp soft tissue injury.  Calvarium intact. Sinuses/Orbits: Globes and orbital soft tissues within normal limits. Chronic opacification of the right ethmoidal air cells. Paranasal sinuses are otherwise clear. No mastoid effusion. Other: None. IMPRESSION: 1. No acute intracranial abnormality. 2. Age-related cerebral atrophy with mild chronic small vessel ischemic disease. Electronically Signed   By: Jeannine Boga M.D.   On: 09/23/2017 04:37   US Carotid Bilateral  Result Date: 09/23/2017 CLINICAL DATA:  84 year old male with syncope and history of carotid stenosis EXAM: BILATERAL CAROTID DUPLEX ULTRASOUND TECHNIQUE: Pearline Cables scale imaging, color Doppler and duplex ultrasound were performed of bilateral carotid and vertebral arteries in the neck. COMPARISON:  MRA head 10/16/2016 FINDINGS: Criteria: Quantification of carotid stenosis is based on velocity parameters that correlate the residual internal carotid diameter with NASCET-based stenosis levels, using the diameter of the distal internal carotid lumen as the denominator for stenosis measurement. The following velocity measurements were obtained: RIGHT ICA: 168/28 cm/sec CCA: 409/81 cm/sec SYSTOLIC ICA/CCA  RATIO:  2.3 ECA:  188 cm/sec LEFT ICA: 101/16 cm/sec CCA: 19/14 cm/sec SYSTOLIC ICA/CCA RATIO:  1.1 ECA:  110 cm/sec RIGHT CAROTID ARTERY: Heterogeneous and slightly irregular atherosclerotic plaque in the proximal internal carotid artery. By peak systolic velocity criteria, the estimated stenosis falls in the 50-69% diameter range. RIGHT VERTEBRAL ARTERY:  Patent with normal antegrade flow. LEFT CAROTID ARTERY: Scattered foci of heterogeneous atherosclerotic plaque in the common carotid artery. Focal heterogeneous ulcerated plaque in the left internal carotid bulb. By peak systolic velocity criteria, the estimated stenosis remains less than 50%. LEFT VERTEBRAL ARTERY:  Patent with normal antegrade flow. IMPRESSION: 1. Moderate (50-69%) stenosis proximal right internal carotid artery secondary to heterogeneous and irregular atherosclerotic plaque. 2. Mild (1-49%) stenosis proximal left internal carotid artery secondary to heterogeneous, irregular and ulcerated atherosclerotic plaque. 3. Vertebral arteries are patent with normal antegrade flow. Signed, Criselda Peaches, MD [facility] Electronically Signed   By: Jacqulynn Cadet M.D.   On: 09/23/2017 12:32   Dg Chest Portable 1 View  Result Date: 09/23/2017 CLINICAL DATA:  85 year old male with syncope. EXAM: PORTABLE CHEST 1 VIEW COMPARISON:  Chest radiograph dated 06/29/2013 FINDINGS: Emphysema. No focal consolidation, pleural effusion, or pneumothorax. The cardiac silhouette is within normal limits. No acute osseous pathology. IMPRESSION: 1. No acute cardiopulmonary process. 2. Emphysema. Electronically Signed   By: Anner Crete M.D.   On: 09/23/2017 04:19     ASSESSMENT AND PLAN: Syncope with no chest pain and 1st degree av block and ventricular ectopy on EKG. Will get echo and  monitor on telemetry.  Elhadji Pecore A

## 2017-09-23 NOTE — Progress Notes (Signed)
*  PRELIMINARY RESULTS* Echocardiogram 2D Echocardiogram has been performed.  [name] 09/23/2017, 2:39 PM

## 2017-09-23 NOTE — Care Management Obs Status (Signed)
[place] NOTIFICATION   Patient Details  Name: [name] MRN: [id number] Date of Birth: 06-Aug-1923   Medicare Observation Status Notification Given:  Yes    Marshell Garfinkel, RN 09/23/2017, 8:01 AM

## 2017-09-23 NOTE — Progress Notes (Addendum)
Patient admitted with syncope this am. Agree with admit MD orders  Tele trops Carotids Echo [facility] IVF gentle cardiology consult   Family in agreement

## 2017-09-23 NOTE — H&P (Signed)
[facility] NAME: [name]    MR#:  [id number]  DATE OF BIRTH:  1923/12/14  DATE OF ADMISSION:  09/23/2017  PRIMARY CARE PHYSICIAN: Tonia Ghent, MD   REQUESTING/REFERRING PHYSICIAN: Nena Polio, MD  CHIEF COMPLAINT:   Chief Complaint  Patient presents with  . Fall    HISTORY OF PRESENT ILLNESS:  [name]  is a 84 y.o. male with a known history of BPH, HTN, HLD, TIA, PVD/carotid stenosis (R 40-60%), GERD, who p/w 1d Hx syncope. Pt has had one syncopal episode in the past, in the setting of a TIA, which I believe was in 2008. The pt is AAOx3, very sharp, appears younger than stated age, is independent of ADLs/IADLs and is highly functional. He is astonishingly healthy for his age. He lives at home with his wife, ambulates w/o an assistive device, is very active and even mows his own lawn. In fact, he tells me he did indeed perform lawn maintenance work on Thursday (09/22/2017), though he took great care to not exert himself excessively, and states he felt great afterwards. He is hard of hearing, and typically wears hearing aids, but did not bring them with him to the hospital. He tells me his carotid stenosis is followed as an outpt, and he is due for repeat imaging soon. He takes ASA81 daily. He is not on alpha blockers for BPH.  Hx is obtained from pt and his wife at bedside, as the pt cannot recall all of the details surrounding his syncopal event. He states that he has a typical nightly routine, which involves waking up in the middle of the night to use the bathroom, then walking to the kitchen to check the time, and then finally relocating to the living room recliner to continue resting. He states that he woke up during the night as per usual (Thursday 09/22/2017 into Friday 09/23/2017), used the bathroom, and then started walking to the kitchen. He does not remember being in the kitchen to check the time, nor does he  remember walking to the living room. He denies feeling lightheaded. He does not remember falling and hitting his head. His wife tells me that she woke up @~0238AM (Friday 05/10) and heard the pt walking around and "bumping into things". She states she got up and went to the living room, and found the pt lying on his back on the floor. She states he was pale and hot/sweaty. She spoke to him, and states he responded coherently/appropriately. He was not confused. The pt and his wife deny shaking/convulsions, drooling/foaming at the mouth, eyes rolling back, tongue biting or fecal incontinence, though the pt states that he did experience urinary incontinence, and soiled his recliner (though he cannot exactly recall this happening). He and his wife also deny slurred speech, facial droop, unilateral weakness, instability/dysequilibrium, ataxia, aphasia, vertigo, blurred vision, tinnitus or other acute changes in hearing/vision. The pt improved while in the living room, and he tells me he remembers being on the living room floor with his wife standing over him. His wife called the neighbor, and then called EMS. Pt was subsequently brought to [facility] ED via ambulance.  Pt has an abrasion on his R anterior forehead, and a Band-Aid on his posterior/superior scalp. At the time of my Hx/examination, he endorses some mild scalp discomfort at the injury site, but does not have any other active complaints. He is comfortable, well-appearing and in no acute distress.  He denies F/C/N/V/D/AP, CP, SOB, palpitations, night sweats, rigors, weight loss, cough, hemoptysis, wheezing, urinary symptoms. Pt's HR did drop into the 50s transiently while I was in the room, but improved to the 60s shortly thereafter. BP WNL (130s/60s).  PAST MEDICAL HISTORY:   Past Medical History:  Diagnosis Date  . BPH (benign prostatic hyperplasia) 06/1997  . Hyperlipemia 1990's  . Hypertension 02/2004  . TIA (transient ischemic attack)    was not on  aspirin at time of TIA per patient    PAST SURGICAL HISTORY:   Past Surgical History:  Procedure Laterality Date  . APPENDECTOMY  1947  . bilateral hernia repair  1965  . CHOLECYSTECTOMY  07/2003  . DG CHEST FOR [employer] EMPLOYEE  06/02-08/2006   TIA, Dyslipidemia, HTN, Right Sided Carotid Artery Stenosis 40-60%  . ESOPHAGOGASTRODUODENOSCOPY  03/23/2001   schatzki ring dilated, [facility]. [facility] (Dr. Uvaldo Rising)  . ESOPHAGOGASTRODUODENOSCOPY  06/13/2012   Procedure: ESOPHAGOGASTRODUODENOSCOPY (EGD);  Surgeon: Jerene Bears, MD;  Location: [facility];  Service: Gastroenterology;  Laterality: N/A;  . holter monitor  10/17/2001   ISOL, PVC's, SVC's  . MRI Brain     nml, MRA brain, intracran athersc dz MCA RPCA 10/18/06, Head CT NML 10/17/06  . MRI Cerv Spine  10/18/2006   mild degen changes    SOCIAL HISTORY:   Social History   Tobacco Use  . Smoking status: Former Smoker    Last attempt to quit: 05/17/1970    Years since quitting: 47.3  . Smokeless tobacco: Never Used  . Tobacco comment: quit 1972  Substance Use Topics  . Alcohol use: No    FAMILY HISTORY:   Family History  Problem Relation Age of Onset  . Stroke Mother   . Diabetes Sister   . Heart disease Sister        CAD, AFib, GI bleed, FE deficiency  . Prostate cancer Brother   . Cancer Brother 37       lungs, smoker  . Colon cancer Neg Hx     DRUG ALLERGIES:   Allergies  Allergen Reactions  . Atorvastatin     REACTION: PALPITATIONS  . Ezetimibe-Simvastatin     REACTION: HEADACHES  . Simvastatin     REACTION: HEADACHE    REVIEW OF SYSTEMS:   Review of Systems  Constitutional: Positive for diaphoresis. Negative for chills, fever, malaise/fatigue and weight loss.  HENT: Positive for hearing loss (+) baseline hearing loss. Negative for congestion, ear pain, nosebleeds, sinus pain, sore throat and tinnitus.   Eyes: Negative for blurred vision, double vision and photophobia.  Respiratory: Negative for cough, hemoptysis,  sputum production, shortness of breath and wheezing.   Cardiovascular: Negative for chest pain, palpitations, orthopnea, claudication, leg swelling and PND.  Gastrointestinal: Negative for abdominal pain, blood in stool, constipation, diarrhea, heartburn, melena, nausea and vomiting.  Genitourinary: Negative for dysuria, frequency, hematuria and urgency.  Musculoskeletal: Negative for back pain, joint pain, myalgias and neck pain.  Skin: Negative for itching and rash.  Neurological: Positive for loss of consciousness. Negative for dizziness, tingling, tremors, sensory change, speech change, focal weakness, seizures, weakness and headaches.   MEDICATIONS AT HOME:   Prior to Admission medications   Medication Sig Start Date End Date Taking? Authorizing Provider  aspirin 81 MG tablet Take 81 mg by mouth daily.   Yes [provider]  lisinopril (PRINIVIL,ZESTRIL) 5 MG tablet Take 1 tablet (5 mg total) by mouth daily. 01/13/17  Yes Tonia Ghent, MD  pantoprazole ([place])  40 MG tablet Take 1 tablet (40 mg total) by mouth daily. 01/13/17  Yes Tonia Ghent, MD  Wheat Dextrin (BENEFIBER) POWD Take 1 Dose by mouth every morning. Takes benefiber in coffee each morning   Yes [provider]      VITAL SIGNS:  Blood pressure 135/77, pulse 71, temperature 97.9 F (36.6 C), temperature source Oral, resp. rate 17, height 5' 8.5" (1.74 m), weight 67.1 kg (148 lb), SpO2 94 %.  PHYSICAL EXAMINATION:  Physical Exam  Constitutional: He is oriented to person, place, and time. He appears well-developed and well-nourished. He is active and cooperative.  Non-toxic appearance. He does not have a sickly appearance. He does not appear ill. No distress.  HENT:  Mouth/Throat: No oropharyngeal exudate.  (+) abrasion R anterior forehead, (+) Band-Aid posterior/superior scalp.  Eyes: Conjunctivae, EOM and lids are normal. No scleral icterus.  Neck: Neck supple. No JVD present. No thyromegaly  present.  Cardiovascular: Normal rate, regular rhythm, S1 normal, S2 normal and normal heart sounds. Exam reveals no gallop, no S3, no S4, no distant heart sounds and no friction rub.  No murmur heard. Pulmonary/Chest: Effort normal and breath sounds normal. No accessory muscle usage or stridor. No apnea, no tachypnea and no bradypnea. No respiratory distress. He has no decreased breath sounds. He has no wheezes. He has no rhonchi. He has no rales.  Abdominal: Soft. Bowel sounds are normal. He exhibits no distension. There is no tenderness. There is no rebound and no guarding.  Musculoskeletal: Normal range of motion. He exhibits no edema or tenderness.  Lymphadenopathy:    He has no cervical adenopathy.  Neurological: He is alert and oriented to person, place, and time. He is not disoriented. He displays normal reflexes. No cranial nerve deficit or sensory deficit. He exhibits normal muscle tone. Coordination normal.  (+) strength, sensory, cerebellar, reflex and cranial nerve testing all largely WNL. (+) baseline hearing loss, pt otherwise non-focal.  Skin: Skin is warm and dry. No rash noted. He is not diaphoretic. No erythema.  Psychiatric: He has a normal mood and affect. His speech is normal and behavior is normal. Judgment and thought content normal. Cognition and memory are normal.   LABORATORY PANEL:   CBC Recent Labs  Lab 09/23/17 0350  WBC 5.5  HGB 14.5  HCT 41.2  PLT 155   ------------------------------------------------------------------------------------------------------------------  Chemistries  Recent Labs  Lab 09/23/17 0350  NA 138  K 3.9  CL 106  CO2 25  GLUCOSE 116*  BUN 20  CREATININE 1.03  CALCIUM 9.0  AST 28  ALT 19  ALKPHOS 61  BILITOT 2.0*   ------------------------------------------------------------------------------------------------------------------  Cardiac Enzymes Recent Labs  Lab 09/23/17 0350  TROPONINI <0.03    ------------------------------------------------------------------------------------------------------------------  RADIOLOGY:  Ct Head Wo Contrast  Result Date: 09/23/2017 CLINICAL DATA:  Initial evaluation for acute altered mental status. Syncope. EXAM: CT HEAD WITHOUT CONTRAST TECHNIQUE: Contiguous axial images were obtained from the base of the skull through the vertex without intravenous contrast. COMPARISON:  Prior MRI from 10/17/2006. FINDINGS: Brain: Generalized age-related cerebral atrophy with mild chronic small vessel ischemic disease. No acute intracranial hemorrhage. No acute large vessel territory infarct. No mass lesion, midline shift or mass effect. No hydrocephalus. No extra-axial fluid collection. Vascular: No hyperdense vessel. Calcified atherosclerotic change present at the skull base. Skull: No appreciable scalp soft tissue injury.  Calvarium intact. Sinuses/Orbits: Globes and orbital soft tissues within normal limits. Chronic opacification of the right ethmoidal air cells. Paranasal sinuses are  otherwise clear. No mastoid effusion. Other: None. IMPRESSION: 1. No acute intracranial abnormality. 2. Age-related cerebral atrophy with mild chronic small vessel ischemic disease. Electronically Signed   By: Jeannine Boga M.D.   On: 09/23/2017 04:37   Dg Chest Portable 1 View  Result Date: 09/23/2017 CLINICAL DATA:  84 year old male with syncope. EXAM: PORTABLE CHEST 1 VIEW COMPARISON:  Chest radiograph dated 06/29/2013 FINDINGS: Emphysema. No focal consolidation, pleural effusion, or pneumothorax. The cardiac silhouette is within normal limits. No acute osseous pathology. IMPRESSION: 1. No acute cardiopulmonary process. 2. Emphysema. Electronically Signed   By: Anner Crete M.D.   On: 09/23/2017 04:19   IMPRESSION AND PLAN:   A/P: 19M syncope.  1.) Syncope: Pt p/w 1d Hx syncope. Pt w/ syncopal episode in the past, in the setting of a TIA. VSS, BP WNL, transient  bradycardia (as noted in HPI) resolved. Cardiopulmonary and neurological physical examinations normal (heart sound normal, lungs clear, pt non-focal). Pt is on ASA81 qD, and endorses daily adherence. Apart from some suggestion of mild dehydration (BUN 20, Cr 1.03), pt's labwork and diagnostics are largely unimpressive. Na+, glucose, Hgb WNL. EKG demonstrates sinus rhythm, PVCs, IRBBB, (-) ischemic ST/TW abnl. CT head (-) acute intracranial abnl. Low suspicion for seizure or stroke. Not ordered for MRI brain. Will evaluate for syncope. Orthostatic VS, FSG, neuro checks, fall precautions. Tele, cardiac monitoring. IVF LR x1L. Ordered for Echocardiogram. I do not typically evaluate syncope w/ carotid U/S, but in this unique instance I have chosen to do so, as pt has a known Hx of TIA w/ known 40-60% R carotid stenosis, his carotid stenosis is typically followed as an outpt, and he is imminently due for reimaging. Possible D/C today if workup (-).  2.) Hyperglycemia: Glucose 116. HbA1c 5.9% as of 02/15/2014.  3.) HTN: c/w Lisinopril.  4.) HLD/TIA/PVD/carotid stenosis: c/w ZOX09. Not on statin, documented adverse drug reaction.  5.) GERD: c/w Protonix.  6.) FEN/GI: Cardiac diet, IVF, Protonix.  7.) DVT PPx: Lovenox 40mg  SQ qD.  8.) Code status: Full code.  9.) Disposition: Observation, pt expected to stay < 2 midnights.   All the records are reviewed and case discussed with ED provider. Management plans discussed with the patient, family and they are in agreement.  CODE STATUS: Full code.  TOTAL TIME TAKING CARE OF THIS PATIENT: 75 minutes.    Arta Silence M.D on 09/23/2017 at 6:09 AM  Between 7am to 6pm - Pager - [phone]  After 6pm go to [email or web address] - password [email or web address] [facility]  [facility]  Office  [phone]  CC: Primary care physician; Tonia Ghent, MD   Note: This dictation was prepared with Dragon dictation along with smaller phrase  technology. Any transcriptional errors that result from this process are unintentional.

## 2017-09-23 NOTE — Care Management (Addendum)
This RNCM met with patient and his wife to discuss MOON letter and transition of care. Patient states this is the first time these symptoms have occurred. He is typically independent at home. He states he had been working outside a lot yesterday and feels he did not drink enough water.  He hopes to return to home today. No identified CM needs. He is current with Dr. Damita Dunnings. He is retired from [employer] however not affiliated with [place]. He has no problems obtaining his medications.

## 2017-09-23 NOTE — ED Provider Notes (Addendum)
[facility] Emergency Department Provider Note   ____________________________________________   First MD Initiated Contact with Patient 09/23/17 [phone]     (approximate)  I have reviewed the triage vital signs and the nursing notes.   HISTORY  Chief Complaint Fall    HPI [name] is a 84 y.o. male Who reports he got up to go use the bathroom as he usually does this morning. He was walking down the hallway when all of a sudden he found himself on the floor. He seems to have passed out. He does not remember falling. He also hit his head. He has a superficial cut on the right forehead. At the present time patient feels back to normal he does not have a headache nausea vomiting or any pain. He has not done this before. He reports he urinated on himself.   Past Medical History:  Diagnosis Date  . BPH (benign prostatic hyperplasia) 06/1997  . Hyperlipemia 1990's  . Hypertension 02/2004  . TIA (transient ischemic attack)    was not on aspirin at time of TIA per patient    Patient Active Problem List   Diagnosis Date Noted  . Leg pain 01/14/2017  . Skin lump of arm 05/29/2015  . Skin lesion 12/15/2014  . Post-nasal drip 12/15/2014  . Hand tingling 12/15/2014  . Hyperglycemia 11/14/2013  . Medicare annual wellness visit, subsequent 11/09/2013  . Paresthesia 07/01/2013  . Cough 09/18/2012  . Dysphagia 06/13/2012  . Schatzki's ring 06/13/2012  . Advance care planning 08/06/2011  . CAROTID ARTERY STENOSIS, RIGHT 11/01/2006  . HLD (hyperlipidemia) 10/29/2006  . Essential hypertension 10/29/2006  . BENIGN PROSTATIC HYPERTROPHY 10/29/2006  . 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 ERECTILE DYSFUNCTION 10/28/2006  . VARICOSE VEINS, LOWER EXTREMITIES 10/28/2006  . TIA 10/17/2006    Past Surgical History:  Procedure Laterality Date  . APPENDECTOMY  1947  . bilateral hernia repair  1965  . CHOLECYSTECTOMY  07/2003  . DG CHEST FOR [employer] EMPLOYEE   06/02-08/2006   TIA, Dyslipidemia, HTN, Right Sided Carotid Artery Stenosis 40-60%  . ESOPHAGOGASTRODUODENOSCOPY  03/23/2001   schatzki ring dilated, [facility]. [facility]. (Dr. Uvaldo Rising)  . ESOPHAGOGASTRODUODENOSCOPY  06/13/2012   Procedure: ESOPHAGOGASTRODUODENOSCOPY (EGD);  Surgeon: Jerene Bears, MD;  Location: [facility];  Service: Gastroenterology;  Laterality: N/A;  . holter monitor  10/17/2001   ISOL, PVC's, SVC's  . MRI Brain     nml, MRA brain, intracran athersc dz MCA RPCA 10/18/06, Head CT NML 10/17/06  . MRI Cerv Spine  10/18/2006   mild degen changes    Prior to Admission medications   Medication Sig Start Date End Date Taking? Authorizing Provider  aspirin 81 MG tablet Take 81 mg by mouth daily.    [provider]  lisinopril (PRINIVIL,ZESTRIL) 5 MG tablet Take 1 tablet (5 mg total) by mouth daily. 01/13/17   Tonia Ghent, MD  pantoprazole (PROTONIX) 40 MG tablet Take 1 tablet (40 mg total) by mouth daily. 01/13/17   Tonia Ghent, MD  pantoprazole (PROTONIX) 40 MG tablet TAKE 1 TABLET BY MOUTH DAILY 02/07/17   Tonia Ghent, MD  sildenafil (REVATIO) 20 MG tablet TAKE TWO OR THREE TABLETS ONE HOUR PRIOR TO RELATIONS AS NEEDED--LIMIT TO ONCE PER DAY 01/13/17   Tonia Ghent, MD  Wheat Dextrin (BENEFIBER PO) Take by mouth daily.    [provider]    Allergies Atorvastatin; Ezetimibe-simvastatin; and Simvastatin  Family History  Problem Relation Age of Onset  .  Stroke Mother   . Diabetes Sister   . Heart disease Sister        CAD, AFib, GI bleed, FE deficiency  . Prostate cancer Brother   . Cancer Brother 43       lungs, smoker  . Colon cancer Neg Hx     Social History Social History   Tobacco Use  . Smoking status: Former Smoker    Last attempt to quit: 05/17/1970    Years since quitting: 47.3  . Smokeless tobacco: Never Used  . Tobacco comment: quit 1972  Substance Use Topics  . Alcohol use: No  . Drug use: No    Review of  Systems  Constitutional: No fever/chills Eyes: No visual changes. ENT: No sore throat. Cardiovascular: Denies chest pain. Respiratory: Denies shortness of breath. Gastrointestinal: No abdominal pain.  No nausea, no vomiting.  No diarrhea.  No constipation. Genitourinary: Negative for dysuria. Musculoskeletal: Negative for back pain. Skin: Negative for rash. Neurological: Negative for headaches, focal weakness ____________________________________________   PHYSICAL EXAM:  VITAL SIGNS: ED Triage Vitals  Enc Vitals Group     BP 09/23/17 0347 135/77     Pulse Rate 09/23/17 0347 71     Resp 09/23/17 0347 17     Temp 09/23/17 0347 97.9 F (36.6 C)     Temp Source 09/23/17 0347 Oral     SpO2 09/23/17 0341 93 %     Weight 09/23/17 0349 148 lb (67.1 kg)     Height 09/23/17 0349 5' 8.5" (1.74 m)     Head Circumference --      Peak Flow --      Pain Score 09/23/17 0349 1     Pain Loc --      Pain Edu? --      Excl. in [place]? --     Constitutional: Alert and oriented. Well appearing and in no acute distress. Eyes: Conjunctivae are normal. PERRL. EOMI. Head: Atraumatic except for specific perfusion to cut on the right forehead.. Nose: No congestion/rhinnorhea. Mouth/Throat: Mucous membranes are moist.  Oropharynx non-erythematous. Neck: No stridor.  No cervical spine tenderness to palpation. Cardiovascular: Normal rate, regular rhythm. Grossly normal heart sounds.  Good peripheral circulation. Respiratory: Normal respiratory effort.  No retractions. Lungs CTAB. Gastrointestinal: Soft and nontender. No distention. No abdominal bruits. No CVA tenderness. Musculoskeletal: No lower extremity tenderness nor edema.  No joint effusions. Neurologic:  Normal speech and language. No gross focal neurologic deficits are appreciated. Skin:  Skin is warm, dry and intact. No rash noted. Psychiatric: Mood and affect are normal. Speech and behavior are  normal.  ____________________________________________   LABS (all labs ordered are listed, but only abnormal results are displayed)  Labs Reviewed  COMPREHENSIVE METABOLIC PANEL - Abnormal; Notable for the following components:      Result Value   Glucose, Bld 116 (*)    Total Bilirubin 2.0 (*)    All other components within normal limits  TROPONIN I  CBC WITH DIFFERENTIAL/PLATELET   ____________________________________________  EKG  EKG read and interpreted by me shows normal sinus rhythm rate of 63 first degree AV block and incomplete right bundle and a computer is reading left anterior hemiblock.there are some PVCs present no acute ST-T changes ____________________________________________  RADIOLOGY  ED MD interpretation:  chest x-ray and CT are read as no acute disease.there is emphysema on the chest x-ray. I reviewed the films and agree with the radiologist.  Official radiology report(s): Ct Head Wo Contrast  Result Date:  09/23/2017 CLINICAL DATA:  Initial evaluation for acute altered mental status. Syncope. EXAM: CT HEAD WITHOUT CONTRAST TECHNIQUE: Contiguous axial images were obtained from the base of the skull through the vertex without intravenous contrast. COMPARISON:  Prior MRI from 10/17/2006. FINDINGS: Brain: Generalized age-related cerebral atrophy with mild chronic small vessel ischemic disease. No acute intracranial hemorrhage. No acute large vessel territory infarct. No mass lesion, midline shift or mass effect. No hydrocephalus. No extra-axial fluid collection. Vascular: No hyperdense vessel. Calcified atherosclerotic change present at the skull base. Skull: No appreciable scalp soft tissue injury.  Calvarium intact. Sinuses/Orbits: Globes and orbital soft tissues within normal limits. Chronic opacification of the right ethmoidal air cells. Paranasal sinuses are otherwise clear. No mastoid effusion. Other: None. IMPRESSION: 1. No acute intracranial abnormality. 2.  Age-related cerebral atrophy with mild chronic small vessel ischemic disease. Electronically Signed   By: Jeannine Boga M.D.   On: 09/23/2017 04:37   Dg Chest Portable 1 View  Result Date: 09/23/2017 CLINICAL DATA:  84 year old male with syncope. EXAM: PORTABLE CHEST 1 VIEW COMPARISON:  Chest radiograph dated 06/29/2013 FINDINGS: Emphysema. No focal consolidation, pleural effusion, or pneumothorax. The cardiac silhouette is within normal limits. No acute osseous pathology. IMPRESSION: 1. No acute cardiopulmonary process. 2. Emphysema. Electronically Signed   By: Anner Crete M.D.   On: 09/23/2017 04:19    ____________________________________________   PROCEDURES  Procedure(s) performed:   Procedures  Critical Care performed:   ____________________________________________   INITIAL IMPRESSION / ASSESSMENT AND PLAN / ED COURSE  patient's chest x-ray head CT and labs look okay EKG shows no acute changes or do not know why he had syncope. It was not during or just after he urinated. It's possible he had cardiac syncope or maybe even a seizure since he did urinate on himself he fell. We will watch him overnight and see if we can elucidate the cause of his syncope.         ____________________________________________   FINAL CLINICAL IMPRESSION(S) / ED DIAGNOSES  Final diagnoses:  Syncope, unspecified syncope type     ED Discharge Orders    None       Note:  This document was prepared using Dragon voice recognition software and may include unintentional dictation errors.    Nena Polio, MD 09/23/17 [id number]    Nena Polio, MD 09/23/17 [phone]

## 2017-09-23 NOTE — ED Triage Notes (Signed)
Per EMS, pt got up to check time in kitchen, when doing so pt had a syncopal moment with fall. Pt has abrasion to forehead, pt also states he urinated on himself. Pt states he does not remember what happened during this event. EMS states pt's wife found pt "nearly unconscious." Pt A&O at this time and able to answer questions. EDP in rm.

## 2017-09-24 LAB — GLUCOSE, CAPILLARY: Glucose-Capillary: 94 mg/dL (ref 65–99)

## 2017-09-24 MED ORDER — ACETAMINOPHEN 325 MG PO TABS: 650.0000 mg | ORAL_TABLET | Freq: Four times a day (QID) | ORAL | Status: DC | PRN

## 2017-09-24 NOTE — Discharge Instructions (Addendum)
Follow-up with primary care physician in 1 week  follow-up with cardiology Dr. Humphrey Rolls On Thursday 10 AM   Fall Prevention in the Home Falls can cause injuries. They can happen to people of all ages. There are many things you can do to make your home safe and to help prevent falls. What can I do on the outside of my home?  Regularly fix the edges of walkways and driveways and fix any cracks.  Remove anything that might make you trip as you walk through a door, such as a raised step or threshold.  Trim any bushes or trees on the path to your home.  Use bright outdoor lighting.  Clear any walking paths of anything that might make someone trip, such as rocks or tools.  Regularly check to see if handrails are loose or broken. Make sure that both sides of any steps have handrails.  Any raised decks and porches should have guardrails on the edges.  Have any leaves, snow, or ice cleared regularly.  Use sand or salt on walking paths during winter.  Clean up any spills in your garage right away. This includes oil or grease spills. What can I do in the bathroom?  Use night lights.  Install grab bars by the toilet and in the tub and shower. Do not use towel bars as grab bars.  Use non-skid mats or decals in the tub or shower.  If you need to sit down in the shower, use a plastic, non-slip stool.  Keep the floor dry. Clean up any water that spills on the floor as soon as it happens.  Remove soap buildup in the tub or shower regularly.  Attach bath mats securely with double-sided non-slip rug tape.  Do not have throw rugs and other things on the floor that can make you trip. What can I do in the bedroom?  Use night lights.  Make sure that you have a light by your bed that is easy to reach.  Do not use any sheets or blankets that are too big for your bed. They should not hang down onto the floor.  Have a firm chair that has side arms. You can use this for support while you get  dressed.  Do not have throw rugs and other things on the floor that can make you trip. What can I do in the kitchen?  Clean up any spills right away.  Avoid walking on wet floors.  Keep items that you use a lot in easy-to-reach places.  If you need to reach something above you, use a strong step stool that has a grab bar.  Keep electrical cords out of the way.  Do not use floor polish or wax that makes floors slippery. If you must use wax, use non-skid floor wax.  Do not have throw rugs and other things on the floor that can make you trip. What can I do with my stairs?  Do not leave any items on the stairs.  Make sure that there are handrails on both sides of the stairs and use them. Fix handrails that are broken or loose. Make sure that handrails are as long as the stairways.  Check any carpeting to make sure that it is firmly attached to the stairs. Fix any carpet that is loose or worn.  Avoid having throw rugs at the top or bottom of the stairs. If you do have throw rugs, attach them to the floor with carpet tape.  Make sure that  you have a light switch at the top of the stairs and the bottom of the stairs. If you do not have them, ask someone to add them for you. What else can I do to help prevent falls?  Wear shoes that: ? Do not have high heels. ? Have rubber bottoms. ? Are comfortable and fit you well. ? Are closed at the toe. Do not wear sandals.  If you use a stepladder: ? Make sure that it is fully opened. Do not climb a closed stepladder. ? Make sure that both sides of the stepladder are locked into place. ? Ask someone to hold it for you, if possible.  Clearly mark and make sure that you can see: ? Any grab bars or handrails. ? First and last steps. ? Where the edge of each step is.  Use tools that help you move around (mobility aids) if they are needed. These include: ? Canes. ? Walkers. ? Scooters. ? Crutches.  Turn on the lights when you go into a  dark area. Replace any light bulbs as soon as they burn out.  Set up your furniture so you have a clear path. Avoid moving your furniture around.  If any of your floors are uneven, fix them.  If there are any pets around you, be aware of where they are.  Review your medicines with your doctor. Some medicines can make you feel dizzy. This can increase your chance of falling. Ask your doctor what other things that you can do to help prevent falls. This information is not intended to replace advice given to you by your health care provider. Make sure you discuss any questions you have with your health care provider. Document Released: 02/27/2009 Document Revised: 10/09/2015 Document Reviewed: 06/07/2014 Elsevier Interactive Patient Education  [employer].

## 2017-09-24 NOTE — Discharge Summary (Signed)
[name]    MR#:  [id number]  DATE OF BIRTH:  12/11/1923  DATE OF ADMISSION:  09/23/2017 ADMITTING PHYSICIAN: Arta Silence, MD  DATE OF DISCHARGE:  09/24/17 PRIMARY CARE PHYSICIAN: Tonia Ghent, MD    ADMISSION DIAGNOSIS:  Syncope, unspecified syncope type [R55]  DISCHARGE DIAGNOSIS:  Active Problems:   Syncope   SECONDARY DIAGNOSIS:   Past Medical History:  Diagnosis Date  . BPH (benign prostatic hyperplasia) 06/1997  . Hyperlipemia 1990's  . Hypertension 02/2004  . TIA (transient ischemic attack)    was not on aspirin at time of TIA per patient    HOSPITAL COURSE:  [name]  is a 84 y.o. male with a known history of BPH, HTN, HLD, TIA, PVD/carotid stenosis (R 40-60%), GERD, who p/w 1d Hx syncope. Pt has had one syncopal episode in the past, in the setting of a TIA, which I believe was in 2008. The pt is AAOx3, very sharp, appears younger than stated age, is independent of ADLs/IADLs and is highly functional. He is astonishingly healthy for his age. He lives at home with his wife, ambulates w/o an assistive device, is very active and even mows his own lawn. In fact, he tells me he did indeed perform lawn maintenance work on Thursday (09/22/2017), though he took great care to not exert himself excessively, and states he felt great afterwards. He is hard of hearing, and typically wears hearing aids, but did not bring them with him to the hospital. He tells me his carotid stenosis is followed as an outpt, and he is due for repeat imaging soon. He takes ASA81 daily. He is not on alpha blockers for BPH.  Hx is obtained from pt and his wife at bedside, as the pt cannot recall all of the details surrounding his syncopal event. He states that he has a typical nightly routine, which involves waking up in the middle of the night to use the bathroom, then walking to the kitchen to check the time,  and then finally relocating to the living room recliner to continue resting. He states that he woke up during the night as per usual (Thursday 09/22/2017 into Friday 09/23/2017), used the bathroom, and then started walking to the kitchen. He does not remember being in the kitchen to check the time, nor does he remember walking to the living room. He denies feeling lightheaded. He does not remember falling and hitting his head. His wife tells me that she woke up @~0238AM (Friday 05/10) and heard the pt walking around and "bumping into things". She states she got up and went to the living room, and found the pt lying on his back on the floor. She states he was pale and hot/sweaty. She spoke to him, and states he responded coherently/appropriately. He was not confused. The pt and his wife deny shaking/convulsions, drooling/foaming at the mouth, eyes rolling back, tongue biting or fecal incontinence, though the pt states that he did experience urinary incontinence, and soiled his recliner (though he cannot exactly recall this happening). He and his wife also deny slurred speech, facial droop, unilateral weakness, instability/dysequilibrium, ataxia, aphasia, vertigo, blurred vision, tinnitus or other acute changes in hearing/vision. The pt improved while in the living room, and he tells me he remembers being on the living room floor with his wife standing over him. His wife called the neighbor, and then called EMS. Pt was subsequently brought to [facility] ED  via ambulance.  Pt has an abrasion on his R anterior forehead, and a Band-Aid on his posterior/superior scalp. At the time of my Hx/examination, he endorses some mild scalp discomfort at the injury site, but does not have any other active complaints. He is comfortable, well-appearing and in no acute distress. He denies F/C/N/V/D/AP, CP, SOB, palpitations, night sweats, rigors, weight loss, cough, hemoptysis, wheezing, urinary symptoms. Pt's HR did drop into the 50s  transiently while I was in the room, but improved to the 60s shortly thereafter. BP WNL (130s/60s).  A/P: 90M syncope.  1.) Syncope: Pt p/w 1d Hx syncope.  Probably from hypotension Blood pressure is on the softer side discontinue lisinopril Pt w/ syncopal episode in the past, in the setting of a TIA. VSS, BP WNL, transient bradycardia (as noted in HPI) resolved. Cardiopulmonary and neurological physical examinations normal (heart sound normal, lungs clear, pt non-focal). Pt is on ASA81 qD, and endorses daily adherence.Hx of TIA w/ known 40-60% R carotid stenosis, his carotid stenosis is typically followed as an outpt, and he is imminently due for reimaging. Echocardiogram normal.  Acute MI ruled out with negative troponins It is asymptomatic.  Seen by cardiology Dr. Yancey Flemings recommending to discharge patient from cardiac standpoint and outpatient follow-up with him on Thursday at 10 AM CCTA   2.) Hyperglycemia: Glucose 116. HbA1c 5.9% as of 02/15/2014.  3.) HTN: Holding lisinopril because of the soft blood pressure for age 56.  4.) HLD/TIA/PVD/carotid stenosis: c/w JGO11. Not on statin, documented adverse drug reaction.  5.) GERD: c/w Protonix.  6.) FEN/GI: Cardiac diet, IVF, Protonix.  7.) DVT PPx: Lovenox 40mg  SQ qD.  8.) Code status: Full code.      DISCHARGE CONDITIONS:  stable  CONSULTS OBTAINED:  Treatment Team:  Arta Silence, MD Dionisio David, MD   PROCEDURES  None   DRUG ALLERGIES:   Allergies  Allergen Reactions  . Atorvastatin     REACTION: PALPITATIONS  . Ezetimibe-Simvastatin     REACTION: HEADACHES  . Simvastatin     REACTION: HEADACHE    DISCHARGE MEDICATIONS:   Allergies as of 09/24/2017      Reactions   Atorvastatin    REACTION: PALPITATIONS   Ezetimibe-simvastatin    REACTION: HEADACHES   Simvastatin    REACTION: HEADACHE      Medication List    STOP taking these medications   lisinopril 5 MG tablet Commonly known as:   PRINIVIL,ZESTRIL     TAKE these medications   acetaminophen 325 MG tablet Commonly known as:  TYLENOL Take 2 tablets (650 mg total) by mouth every 6 (six) hours as needed for mild pain (or Fever >/= 101).   aspirin 81 MG tablet Take 81 mg by mouth daily.   BENEFIBER Powd Take 1 Dose by mouth every morning. Takes benefiber in coffee each morning   pantoprazole 40 MG tablet Commonly known as:  PROTONIX Take 1 tablet (40 mg total) by mouth daily.        DISCHARGE INSTRUCTIONS:   Follow-up with primary care physician in 1 week  follow-up with cardiology Dr. Humphrey Rolls On Thursday 10 AM  DIET:  Cardiac diet  DISCHARGE CONDITION:  Stable  ACTIVITY:  Activity as tolerated , do not drive until seen and evaluated by cardiology on Thursday  OXYGEN:  Home Oxygen: No.   Oxygen Delivery: room air  DISCHARGE LOCATION:  home   If you experience worsening of your admission symptoms, develop shortness of breath, life threatening emergency, suicidal  or homicidal thoughts you must seek medical attention immediately by calling 911 or calling your MD immediately  if symptoms less severe.  You Must read complete instructions/literature along with all the possible adverse reactions/side effects for all the Medicines you take and that have been prescribed to you. Take any new Medicines after you have completely understood and accpet all the possible adverse reactions/side effects.   Please note  You were cared for by a hospitalist during your hospital stay. If you have any questions about your discharge medications or the care you received while you were in the hospital after you are discharged, you can call the unit and asked to speak with the hospitalist on call if the hospitalist that took care of you is not available. Once you are discharged, your primary care physician will handle any further medical issues. Please note that NO REFILLS for any discharge medications will be authorized once  you are discharged, as it is imperative that you return to your primary care physician (or establish a relationship with a primary care physician if you do not have one) for your aftercare needs so that they can reassess your need for medications and monitor your lab values.     Today  Chief Complaint  Patient presents with  . Fall   Patient denies any symptoms no chest pain no shortness of breath no dizziness.  Wife at bedside.  Wants to go home.  Okay to discharge patient from cardiology standpoint  ROS:  CONSTITUTIONAL: Denies fevers, chills. Denies any fatigue, weakness.  EYES: Denies blurry vision, double vision, eye pain. EARS, NOSE, THROAT: Denies tinnitus, ear pain, hearing loss. RESPIRATORY: Denies cough, wheeze, shortness of breath.  CARDIOVASCULAR: Denies chest pain, palpitations, edema.  GASTROINTESTINAL: Denies nausea, vomiting, diarrhea, abdominal pain. Denies bright red blood per rectum. GENITOURINARY: Denies dysuria, hematuria. ENDOCRINE: Denies nocturia or thyroid problems. HEMATOLOGIC AND LYMPHATIC: Denies easy bruising or bleeding. SKIN: Denies rash or lesion. MUSCULOSKELETAL: Denies pain in neck, back, shoulder, knees, hips or arthritic symptoms.  NEUROLOGIC: Denies paralysis, paresthesias.  PSYCHIATRIC: Denies anxiety or depressive symptoms.   VITAL SIGNS:  Blood pressure 139/69, pulse (!) 51, temperature 98.2 F (36.8 C), temperature source Oral, resp. rate 19, height 5\' 9"  (1.753 m), weight 64.6 kg (142 lb 8 oz), SpO2 96 %.  I/O:    Intake/Output Summary (Last 24 hours) at 09/24/2017 1402 Last data filed at 09/24/2017 1350 Gross per 24 hour  Intake 1200 ml  Output 2000 ml  Net -800 ml    PHYSICAL EXAMINATION:  GENERAL:  84 y.o.-year-old patient lying in the bed with no acute distress.  EYES: Pupils equal, round, reactive to light and accommodation. No scleral icterus. Extraocular muscles intact.  HEENT: Head atraumatic, normocephalic. Oropharynx and  nasopharynx clear.  NECK:  Supple, no jugular venous distention. No thyroid enlargement, no tenderness.  LUNGS: Normal breath sounds bilaterally, no wheezing, rales,rhonchi or crepitation. No use of accessory muscles of respiration.  CARDIOVASCULAR: S1, S2 normal. No murmurs, rubs, or gallops.  ABDOMEN: Soft, non-tender, non-distended. Bowel sounds present. No organomegaly or mass.  EXTREMITIES: No pedal edema, cyanosis, or clubbing.  NEUROLOGIC: Cranial nerves II through XII are intact. Muscle strength 5/5 in all extremities. Sensation intact. Gait not checked.  PSYCHIATRIC: The patient is alert and oriented x 3.  SKIN: No obvious rash, lesion, or ulcer.   DATA REVIEW:   CBC Recent Labs  Lab 09/23/17 0350  WBC 5.5  HGB 14.5  HCT 41.2  PLT 155  Chemistries  Recent Labs  Lab 09/23/17 0350  NA 138  K 3.9  CL 106  CO2 25  GLUCOSE 116*  BUN 20  CREATININE 1.03  CALCIUM 9.0  AST 28  ALT 19  ALKPHOS 61  BILITOT 2.0*    Cardiac Enzymes Recent Labs  Lab 09/23/17 2234  TROPONINI <0.03    Microbiology Results  No results found for this or any previous visit.  RADIOLOGY:  Ct Head Wo Contrast  Result Date: 09/23/2017 CLINICAL DATA:  Initial evaluation for acute altered mental status. Syncope. EXAM: CT HEAD WITHOUT CONTRAST TECHNIQUE: Contiguous axial images were obtained from the base of the skull through the vertex without intravenous contrast. COMPARISON:  Prior MRI from 10/17/2006. FINDINGS: Brain: Generalized age-related cerebral atrophy with mild chronic small vessel ischemic disease. No acute intracranial hemorrhage. No acute large vessel territory infarct. No mass lesion, midline shift or mass effect. No hydrocephalus. No extra-axial fluid collection. Vascular: No hyperdense vessel. Calcified atherosclerotic change present at the skull base. Skull: No appreciable scalp soft tissue injury.  Calvarium intact. Sinuses/Orbits: Globes and orbital soft tissues within  normal limits. Chronic opacification of the right ethmoidal air cells. Paranasal sinuses are otherwise clear. No mastoid effusion. Other: None. IMPRESSION: 1. No acute intracranial abnormality. 2. Age-related cerebral atrophy with mild chronic small vessel ischemic disease. Electronically Signed   By: Jeannine Boga M.D.   On: 09/23/2017 04:37   US Carotid Bilateral  Result Date: 09/23/2017 CLINICAL DATA:  84 year old male with syncope and history of carotid stenosis EXAM: BILATERAL CAROTID DUPLEX ULTRASOUND TECHNIQUE: Pearline Cables scale imaging, color Doppler and duplex ultrasound were performed of bilateral carotid and vertebral arteries in the neck. COMPARISON:  MRA head 10/16/2016 FINDINGS: Criteria: Quantification of carotid stenosis is based on velocity parameters that correlate the residual internal carotid diameter with NASCET-based stenosis levels, using the diameter of the distal internal carotid lumen as the denominator for stenosis measurement. The following velocity measurements were obtained: RIGHT ICA: 168/28 cm/sec CCA: 546/56 cm/sec SYSTOLIC ICA/CCA RATIO:  2.3 ECA:  188 cm/sec LEFT ICA: 101/16 cm/sec CCA: 81/27 cm/sec SYSTOLIC ICA/CCA RATIO:  1.1 ECA:  110 cm/sec RIGHT CAROTID ARTERY: Heterogeneous and slightly irregular atherosclerotic plaque in the proximal internal carotid artery. By peak systolic velocity criteria, the estimated stenosis falls in the 50-69% diameter range. RIGHT VERTEBRAL ARTERY:  Patent with normal antegrade flow. LEFT CAROTID ARTERY: Scattered foci of heterogeneous atherosclerotic plaque in the common carotid artery. Focal heterogeneous ulcerated plaque in the left internal carotid bulb. By peak systolic velocity criteria, the estimated stenosis remains less than 50%. LEFT VERTEBRAL ARTERY:  Patent with normal antegrade flow. IMPRESSION: 1. Moderate (50-69%) stenosis proximal right internal carotid artery secondary to heterogeneous and irregular atherosclerotic plaque. 2.  Mild (1-49%) stenosis proximal left internal carotid artery secondary to heterogeneous, irregular and ulcerated atherosclerotic plaque. 3. Vertebral arteries are patent with normal antegrade flow. Signed, Criselda Peaches, MD [facility] Electronically Signed   By: Jacqulynn Cadet M.D.   On: 09/23/2017 12:32   Dg Chest Portable 1 View  Result Date: 09/23/2017 CLINICAL DATA:  84 year old male with syncope. EXAM: PORTABLE CHEST 1 VIEW COMPARISON:  Chest radiograph dated 06/29/2013 FINDINGS: Emphysema. No focal consolidation, pleural effusion, or pneumothorax. The cardiac silhouette is within normal limits. No acute osseous pathology. IMPRESSION: 1. No acute cardiopulmonary process. 2. Emphysema. Electronically Signed   By: Anner Crete M.D.   On: 09/23/2017 04:19    EKG:   Orders placed  or performed during the hospital encounter of 09/23/17  . EKG 12-Lead  . EKG 12-Lead  . ED EKG  . ED EKG      Management plans discussed with the patient, family and they are in agreement.  CODE STATUS:     Code Status Orders  (From admission, onward)        Start     Ordered   09/23/17 0852  Full code  Continuous     09/23/17 0851    Code Status History    This patient has a current code status but no historical code status.    Advance Directive Documentation     Most Recent Value  Type of Advance Directive  Healthcare Power of Attorney, Living will  Pre-existing out of facility DNR order (yellow form or pink MOST form)  -  "MOST" Form in Place?  -      TOTAL TIME TAKING CARE OF THIS PATIENT: 39 minutes.   Note: This dictation was prepared with Dragon dictation along with smaller phrase technology. Any transcriptional errors that result from this process are unintentional.   @MEC @  on 09/24/2017 at 2:02 PM  Between 7am to 6pm - Pager - [phone]  After 6pm go to [email or web address] - password [email or web address] [place]  [facility]  Office  [phone]  CC: Primary care physician; Tonia Ghent, MD

## 2017-09-24 NOTE — [facility] Notes (Signed)
SUBJECTIVE: Feeling much better   Vitals:   09/23/17 1936 09/24/17 0355 09/24/17 0400 09/24/17 0905  BP: (!) 128/58 120/60  139/69  Pulse: (!) 51 (!) 51  (!) 51  Resp: 18   19  Temp: 97.7 F (36.5 C) 98.2 F (36.8 C)    TempSrc: Oral Oral    SpO2: 96% 95%  96%  Weight:   142 lb 8 oz (64.6 kg)   Height:        Intake/Output Summary (Last 24 hours) at 09/24/2017 0927 Last data filed at 09/24/2017 0615 Gross per 24 hour  Intake 600 ml  Output 2125 ml  Net -1525 ml    LABS: Basic Metabolic Panel: Recent Labs    09/23/17 0350  NA 138  K 3.9  CL 106  CO2 25  GLUCOSE 116*  BUN 20  CREATININE 1.03  CALCIUM 9.0   Liver Function Tests: Recent Labs    09/23/17 0350  AST 28  ALT 19  ALKPHOS 61  BILITOT 2.0*  PROT 7.0  ALBUMIN 4.1   No results for input(s): LIPASE, AMYLASE in the last 72 hours. CBC: Recent Labs    09/23/17 0350  WBC 5.5  NEUTROABS 2.3  HGB 14.5  HCT 41.2  MCV 93.2  PLT 155   Cardiac Enzymes: Recent Labs    09/23/17 1130 09/23/17 1623 09/23/17 2234  TROPONINI <0.03 <0.03 <0.03   BNP: Invalid input(s): POCBNP D-Dimer: No results for input(s): DDIMER in the last 72 hours. Hemoglobin A1C: No results for input(s): HGBA1C in the last 72 hours. Fasting Lipid Panel: No results for input(s): CHOL, HDL, LDLCALC, TRIG, CHOLHDL, LDLDIRECT in the last 72 hours. Thyroid Function Tests: No results for input(s): TSH, T4TOTAL, T3FREE, THYROIDAB in the last 72 hours.  Invalid input(s): FREET3 Anemia Panel: No results for input(s): VITAMINB12, FOLATE, FERRITIN, TIBC, IRON, RETICCTPCT in the last 72 hours.   PHYSICAL EXAM General: Well developed, well nourished, in no acute distress HEENT:  Normocephalic and atramatic Neck:  No JVD.  Lungs: Clear bilaterally to auscultation and percussion. Heart: HRRR . Normal S1 and S2 without gallops or murmurs.  Abdomen: Bowel sounds are positive, abdomen soft and non-tender  Msk:  Back normal, normal  gait. Normal strength and tone for age. Extremities: No clubbing, cyanosis or edema.   Neuro: Alert and oriented X 3. Psych:  Good affect, responds appropriately  TELEMETRY: NSR ASSESSMENT AND PLAN: Syncope with echo, normal LVEF. May go home with f/u next Thursday 10 am, and may do CCTA.  Active Problems:   Syncope    Dionisio David, MD, [facility] 09/24/2017 9:27 AM

## 2017-09-26 ENCOUNTER — Telehealth: Payer: Self-pay | Admitting: Family Medicine

## 2017-09-26 ENCOUNTER — Telehealth: Payer: Self-pay

## 2017-09-26 NOTE — Telephone Encounter (Signed)
Will d/ pt at [facility].  Thanks.

## 2017-09-26 NOTE — Telephone Encounter (Signed)
Transition Care Management Follow-up Telephone Call   Date discharged?09/24/17   How have you been since you were released from the hospital? "Doing fine now"   Do you understand why you were in the hospital? Yes   Do you understand the discharge instructions? No questions, just wondering if I still need to keep cardiology appointment?  (see below)   Where were you discharged to?  Home without assistance, "wife takes good care of me"  Items Reviewed:  Medications reviewed: Yes  Allergies reviewed: Yes  Dietary changes reviewed: Patient is on a heart healthy a diet, but states he tries to "behave" but he likes "taste".  Denies any fluid retention or SOB.   Referrals reviewed: yes, (see below ) regarding cardiologist.   Functional Questionnaire:   Activities of Daily Living (ADLs):   He states they are independent in the following: all ADL'S, dressing, grooming, bathing, toileting, feeding and ambulation.  States they require assistance with the following: (Not Applicable).     Any transportation issues/concerns?: Has been restricted from driving but wife provides transportation.    Any patient concerns? Patient unsure that he should keep his referral appt with cardiology.  Given patient's cardiovascular hx and current hospital needs, it would be highly recommended patient keep follow up.  Patient has decided to hold off on referral until he can speak with Dr. Damita Dunnings about it on Thursday at his hospital follow up.  He currently states he feels fine without can complaints or concerns and will be checking/recording bp's daily and will call us if any concerning elevated readings as ER has taken him off of his Lisinopril.      Confirmed importance and date/time of follow-up visits scheduled Yes  Provider Appointment booked with Dr. Damita Dunnings for 09/29/17 at 1200pm.   Confirmed with patient if condition begins to worsen call PCP or go to the ER.  Patient was given the office number  and encouraged to call back with question or concerns.  : Yes

## 2017-09-26 NOTE — Telephone Encounter (Signed)
Copied from [place] [id number]. Topic: Quick Communication - See Telephone Encounter >> Sep 26, 2017 10:33 AM Cleaster Corin, NT wrote: CRM for notification. See Telephone encounter for: 09/26/17.  Pt. Would like to speak with nurse or provider about his cardiologist appt. Pt. Said that he didn't have any heart problems and doesn't think he should have to go to appt. But would like to have someone  to call him about appt.

## 2017-09-26 NOTE — Telephone Encounter (Signed)
Noted. Thanks.

## 2017-09-26 NOTE — Telephone Encounter (Addendum)
I am discussing patient's need to keep his cardiology f/u appt after recent hospitalization due to syncope and bradycardia during our TCM hosp f/u call.  Patient also has hx of carotid stenosis, TIA, PVD and HTN. Given his cardiovascular hx, it would likely be highly recommended for him to be followed by cardiology for best continuity of care.  Patient is not interested at all in seeing Dr. Humphrey Rolls and would rather wait and discuss with Dr. Damita Dunnings at his hospital follow up on Thursday. If cardiology is needed, he would like Dr. Damita Dunnings to make the referral.   He is doing well right now without concerns or complaints, feels fine.  I instructed him to check his blood pressures regularly until his appointment and bring readings with him (as hospital discharged him home and d/c'ed his Lisinopril).  Patient is aware to notify us if his blood pressures are running high before his appointment.    Patient verbalizes understanding and thanks [place] for our call.

## 2017-09-29 ENCOUNTER — Encounter: Payer: Self-pay | Admitting: Family Medicine

## 2017-09-29 ENCOUNTER — Ambulatory Visit: Payer: Medicare Other | Admitting: Family Medicine

## 2017-09-29 VITALS — BP 176/72 | HR 54 | Temp 97.4°F | Ht 69.0 in | Wt 144.2 lb

## 2017-09-29 DIAGNOSIS — R55 Syncope and collapse: Secondary | ICD-10-CM

## 2017-09-29 NOTE — Patient Instructions (Signed)
Stop sildenafil, stay off lisinopril, drink plenty of water- enough to keep your urine clear.   We will call about your referral.  Rosaria Ferries or Azalee Course will call you if you don't see one of them on the way out.  Take care.  Glad to see you.

## 2017-09-29 NOTE — Progress Notes (Signed)
PATIENT NAME: [name]    MR#:  [id number]  DATE OF BIRTH:  06-05-1923  DATE OF ADMISSION:  09/23/2017    ADMITTING PHYSICIAN: Arta Silence, MD  DATE OF DISCHARGE:  09/24/17 PRIMARY CARE PHYSICIAN: Tonia Ghent, MD    ADMISSION DIAGNOSIS:  Syncope, unspecified syncope type [R55]  DISCHARGE DIAGNOSIS:  Active Problems:   Syncope   SECONDARY DIAGNOSIS:       Past Medical History:  Diagnosis Date  . BPH (benign prostatic hyperplasia) 06/1997  . Hyperlipemia 1990's  . Hypertension 02/2004  . TIA (transient ischemic attack)    was not on aspirin at time of TIA per patient    HOSPITAL COURSE:  [name] a84 y.o.malewith a known history of BPH, HTN, HLD, TIA, PVD/carotid stenosis (R 40-60%), GERD, who p/w 1d Hx syncope. Pt has had one syncopal episode in the past, in the setting of a TIA, which I believe was in 2008. The pt is AAOx3, very sharp, appears younger than stated age, is independent of ADLs/IADLs and is highly functional. He is astonishingly healthy for his age. He lives at home with his wife, ambulates w/o an assistive device, is very active and even mows his own lawn. In fact, he tells me he did indeed perform lawn maintenance work on Thursday (09/22/2017), though he took great care to not exert himself excessively, and states he felt great afterwards. He is hard of hearing, and typically wears hearing aids, but did not bring them with him to the hospital. He tells me his carotid stenosis is followed as an outpt, and he is due for repeat imaging soon. He takes ASA81 daily. He is not on alpha blockers for BPH.  Hx is obtained from pt and his wife at bedside, as the pt cannot recall all of the details surrounding his syncopal event. He states that he has a typical nightly routine, which involves waking up in the middle of the night to use the bathroom, then walking to the kitchen to check the time, and then finally relocating to the living  room recliner to continue resting. He states that he woke up during the night as per usual (Thursday 09/22/2017 into Friday 09/23/2017), used the bathroom, and then started walking to the kitchen. He does not remember being in the kitchen to check the time, nor does he remember walking to the living room. He denies feeling lightheaded. He does not remember falling and hitting his head. His wife tells me that she woke up @~0238AM (Friday 05/10) and heard the pt walking around and "bumping into things". She states she got up and went to the living room, and found the pt lying on his back on the floor. She states he was pale and hot/sweaty. She spoke to him, and states he responded coherently/appropriately. He was not confused. The pt and his wife deny shaking/convulsions, drooling/foaming at the mouth, eyes rolling back, tongue biting or fecal incontinence, though the pt states that he did experience urinary incontinence, and soiled his recliner (though he cannot exactly recall this happening). He and his wife also deny slurred speech, facial droop, unilateral weakness, instability/dysequilibrium, ataxia, aphasia, vertigo, blurred vision, tinnitus or other acute changes in hearing/vision. The pt improved while in the living room, and he tells me he remembers being on the living room floor with his wife standing over him. His wife called the neighbor, and then called EMS. Pt was subsequently brought to [facility] ED via ambulance.  Pt has an abrasion on his R  anterior forehead, and a Band-Aid on his posterior/superior scalp. At the time of my Hx/examination, he endorses some mild scalp discomfort at the injury site, but does not have any other active complaints. He is comfortable, well-appearing and in no acute distress. He denies F/C/N/V/D/AP, CP, SOB, palpitations, night sweats, rigors, weight loss, cough, hemoptysis, wheezing, urinary symptoms. Pt's HR did drop into the 50s transiently while I was in the room, but  improved to the 60s shortly thereafter. BP WNL (130s/60s).  A/P: 69M syncope.  1.) Syncope: Pt p/w 1d Hx syncope.  Probably from hypotension Blood pressure is on the softer side discontinue lisinopril Pt w/ syncopal episode in the past, in the setting of a TIA. VSS, BP WNL, transient bradycardia (as noted in HPI) resolved. Cardiopulmonary and neurological physical examinations normal (heart sound normal, lungs clear, pt non-focal). Pt is on ASA81 qD, and endorses daily adherence.Hx of TIA w/ known 40-60% R carotid stenosis, his carotid stenosis is typically followed as an outpt, and he is imminently due for reimaging. Echocardiogram normal.  Acute MI ruled out with negative troponins It is asymptomatic.  Seen by cardiology Dr. Yancey Flemings recommending to discharge patient from cardiac standpoint and outpatient follow-up with him on Thursday at 10 AM CCTA   2.) Hyperglycemia: Glucose 116. HbA1c 5.9% as of 02/15/2014.  3.) HTN: Holding lisinopril because of the soft blood pressure for age 84.  4.) HLD/TIA/PVD/carotid stenosis: c/w [id number]. Not on statin, documented adverse drug reaction.  5.) GERD: c/w Protonix.  6.) FEN/GI: Cardiac diet, IVF, Protonix.  7.) DVT PPx: Lovenox 40mg  SQ qD.  8.) Code status: Full code.   ============================================= Inpatient follow-up.  Inpatient course discussed with patient.  He had gotten up in the middle of the night and his wife found him down on the floor.  He was up and walking around, leaving the BR- that is his last memory until waking up on the floor. Wife heard him and got up to check when she heard something abnormal.  He had taken sildenafil the night prior to the event.  He didn't have obvious ADE on the med before.  She found him to be sweaty but did not have a typical post ictal period of confusion.  She called 911 the patient was transferred to the hospital.  He does not have any other typical focal neurologic symptoms except  for some bilateral tingling in the hands that is episodic, long-standing, and clearly responds to splinting.  He had no known lip droop or gait changes otherwise.  No focal weakness.  Inpatient course discussed with patient and the rationale for previous testing discussed.  He is still not driving.  I advised him not to drive until he is seen by cardiology.  He needed referral set up to cardiology, discussed and ordered at the office visit.  Rationale for current medications discussed with patient.  In the meantime, no chest pain, no shortness of breath, no syncope or near syncope.  He feels well at baseline otherwise.  He has not taken sildenafil in the meantime.  PMH and SH reviewed  ROS: Per HPI unless specifically indicated in ROS section   Meds, vitals, and allergies reviewed.   GEN: nad, alert and oriented HEENT: mucous membranes moist NECK: supple w/o LA CV: rrr PULM: ctab, no inc wob ABD: soft, +bs EXT: no edema SKIN: no acute rash

## 2017-10-02 ENCOUNTER — Encounter: Payer: Self-pay | Admitting: Family Medicine

## 2017-10-02 NOTE — Assessment & Plan Note (Signed)
He likely had a multifactorial vasovagal/hypotensive event that was related to getting up in the middle of the night, previous sildenafil use, blood pressure medications.  In the meantime he has avoided sildenafil use.  He is drinking an adequate amount of fluid.  He has stopped taking lisinopril.  I would rather his blood pressure run a little high in the meantime and not have any hypotensive episodes.  Rationale discussed with patient.  He is not lightheaded.  Okay for outpatient follow-up.  Refer to cardiology.  It does not appear that he would benefit from repeat labs at this point, so none were collected.  All questions answered.  Advised not to drive in the meantime.

## 2017-10-12 DIAGNOSIS — Z87898 Personal history of other specified conditions: Secondary | ICD-10-CM | POA: Insufficient documentation

## 2018-01-22 ENCOUNTER — Other Ambulatory Visit: Payer: Self-pay | Admitting: Family Medicine

## 2018-01-22 DIAGNOSIS — E785 Hyperlipidemia, unspecified: Secondary | ICD-10-CM

## 2018-01-22 DIAGNOSIS — I10 Essential (primary) hypertension: Secondary | ICD-10-CM

## 2018-01-23 ENCOUNTER — Ambulatory Visit (INDEPENDENT_AMBULATORY_CARE_PROVIDER_SITE_OTHER): Payer: Medicare Other

## 2018-01-23 ENCOUNTER — Encounter: Payer: Self-pay | Admitting: Family Medicine

## 2018-01-23 ENCOUNTER — Ambulatory Visit (INDEPENDENT_AMBULATORY_CARE_PROVIDER_SITE_OTHER): Payer: Medicare Other | Admitting: Family Medicine

## 2018-01-23 VITALS — BP 122/72 | HR 52 | Temp 97.5°F | Ht 69.0 in | Wt 146.8 lb

## 2018-01-23 DIAGNOSIS — E785 Hyperlipidemia, unspecified: Secondary | ICD-10-CM

## 2018-01-23 DIAGNOSIS — R55 Syncope and collapse: Secondary | ICD-10-CM | POA: Diagnosis not present

## 2018-01-23 DIAGNOSIS — Z0000 Encounter for general adult medical examination without abnormal findings: Secondary | ICD-10-CM

## 2018-01-23 DIAGNOSIS — Z23 Encounter for immunization: Secondary | ICD-10-CM

## 2018-01-23 DIAGNOSIS — F528 Other sexual dysfunction not due to a substance or known physiological condition: Secondary | ICD-10-CM

## 2018-01-23 DIAGNOSIS — R202 Paresthesia of skin: Secondary | ICD-10-CM

## 2018-01-23 DIAGNOSIS — K140 Glossitis: Secondary | ICD-10-CM

## 2018-01-23 DIAGNOSIS — Z7189 Other specified counseling: Secondary | ICD-10-CM

## 2018-01-23 LAB — COMPREHENSIVE METABOLIC PANEL
ALK PHOS: 61 U/L (ref 39–117)
ALT: 18 U/L (ref 0–53)
AST: 22 U/L (ref 0–37)
Albumin: 4.4 g/dL (ref 3.5–5.2)
BILIRUBIN TOTAL: 1.7 mg/dL — AB (ref 0.2–1.2)
BUN: 13 mg/dL (ref 6–23)
CO2: 31 meq/L (ref 19–32)
CREATININE: 0.96 mg/dL (ref 0.40–1.50)
Calcium: 9.7 mg/dL (ref 8.4–10.5)
Chloride: 101 mEq/L (ref 96–112)
GFR: 77.89 mL/min (ref >60.00)
Glucose, Bld: 99 mg/dL (ref 70–99)
Potassium: 4.2 mEq/L (ref 3.5–5.1)
Sodium: 138 mEq/L (ref 135–145)
TOTAL PROTEIN: 7.2 g/dL (ref 6.0–8.3)

## 2018-01-23 LAB — LIPID PANEL
CHOL/HDL RATIO: 4
Cholesterol: 182 mg/dL (ref 0–200)
HDL: 44.9 mg/dL (ref >39.00)
LDL Cholesterol: 108 mg/dL — ABNORMAL HIGH (ref 0–99)
NonHDL: 136.84
TRIGLYCERIDES: 146 mg/dL (ref 0.0–149.0)
VLDL: 29.2 mg/dL (ref 0.0–40.0)

## 2018-01-23 LAB — CBC WITH DIFFERENTIAL/PLATELET
Basophils Absolute: 0 10*3/uL (ref 0.0–0.1)
Basophils Relative: 0.2 % (ref 0.0–3.0)
EOS PCT: 2.4 % (ref 0.0–5.0)
Eosinophils Absolute: 0.1 10*3/uL (ref 0.0–0.7)
HCT: 43.7 % (ref 39.0–52.0)
Hemoglobin: 15.2 g/dL (ref 13.0–17.0)
LYMPHS ABS: 2 10*3/uL (ref 0.7–4.0)
Lymphocytes Relative: 43.2 % (ref 12.0–46.0)
MCHC: 34.9 g/dL (ref 30.0–36.0)
MCV: 92.7 fl (ref 78.0–100.0)
MONO ABS: 0.5 10*3/uL (ref 0.1–1.0)
MONOS PCT: 11.2 % (ref 3.0–12.0)
NEUTROS ABS: 2 10*3/uL (ref 1.4–7.7)
NEUTROS PCT: 43 % (ref 43.0–77.0)
PLATELETS: 166 10*3/uL (ref 150.0–400.0)
RBC: 4.71 Mil/uL (ref 4.22–5.81)
RDW: 13.8 % (ref 11.5–15.5)
WBC: 4.7 10*3/uL (ref 4.0–10.5)

## 2018-01-23 NOTE — Patient Instructions (Signed)
[name] , Thank you for taking time to come for your Medicare Wellness Visit. I appreciate your ongoing commitment to your health goals. Please review the following plan we discussed and let me know if I can assist you in the future.   These are the goals we discussed: Goals    . DIET - INCREASE WATER INTAKE     Starting 01/23/2018, I will attempt to drink at least 6-8 glasses of water daily.        This is a list of the screening recommended for you and due dates:  Health Maintenance  Topic Date Due  . Flu Shot  12/15/2017  . Tetanus Vaccine  09/24/2027  . Pneumonia vaccines  Completed   Preventive Care for Adults  A healthy lifestyle and preventive care can promote health and wellness. Preventive health guidelines for adults include the following key practices.  . A routine yearly physical is a good way to check with your health care provider about your health and preventive screening. It is a chance to share any concerns and updates on your health and to receive a thorough exam.  . Visit your dentist for a routine exam and preventive care every 6 months. Brush your teeth twice a day and floss once a day. Good oral hygiene prevents tooth decay and gum disease.  . The frequency of eye exams is based on your age, health, family medical history, use  of contact lenses, and other factors. Follow your health care provider's recommendations for frequency of eye exams.  . Eat a healthy diet. Foods like vegetables, fruits, whole grains, low-fat dairy products, and lean protein foods contain the nutrients you need without too many calories. Decrease your intake of foods high in solid fats, added sugars, and salt. Eat the right amount of calories for you. Get information about a proper diet from your health care provider, if necessary.  . Regular physical exercise is one of the most important things you can do for your health. Most adults should get at least 150 minutes of moderate-intensity  exercise (any activity that increases your heart rate and causes you to sweat) each week. In addition, most adults need muscle-strengthening exercises on 2 or more days a week.  Silver Sneakers may be a benefit available to you. To determine eligibility, you may visit the website: www.silversneakers.com or contact program at [phone] Mon-Fri between 8AM-8PM.   . Maintain a healthy weight. The body mass index (BMI) is a screening tool to identify possible weight problems. It provides an estimate of body fat based on height and weight. Your health care provider can find your BMI and can help you achieve or maintain a healthy weight.   For adults 20 years and older: ? A BMI below 18.5 is considered underweight. ? A BMI of 18.5 to 24.9 is normal. ? A BMI of 25 to 29.9 is considered overweight. ? A BMI of 30 and above is considered obese.   . Maintain normal blood lipids and cholesterol levels by exercising and minimizing your intake of saturated fat. Eat a balanced diet with plenty of fruit and vegetables. Blood tests for lipids and cholesterol should begin at age 59 and be repeated every 5 years. If your lipid or cholesterol levels are high, you are over 50, or you are at high risk for heart disease, you may need your cholesterol levels checked more frequently. Ongoing high lipid and cholesterol levels should be treated with medicines if diet and exercise are not  working.  . If you smoke, find out from your health care provider how to quit. If you do not use tobacco, please do not start.  . If you choose to drink alcohol, please do not consume more than 2 drinks per day. One drink is considered to be 12 ounces (355 mL) of beer, 5 ounces (148 mL) of wine, or 1.5 ounces (44 mL) of liquor.  . If you are 30-86 years old, ask your health care provider if you should take aspirin to prevent strokes.  . Use sunscreen. Apply sunscreen liberally and repeatedly throughout the day. You should seek shade  when your shadow is shorter than you. Protect yourself by wearing long sleeves, pants, a wide-brimmed hat, and sunglasses year round, whenever you are outdoors.  . Once a month, do a whole body skin exam, using a mirror to look at the skin on your back. Tell your health care provider of new moles, moles that have irregular borders, moles that are larger than a pencil eraser, or moles that have changed in shape or color.

## 2018-01-23 NOTE — Progress Notes (Addendum)
Flu encouraged.   Shingles d/w pt. Out of stock.   PNA up to date Tetanus 2015 Colonoscopy NA due to age, he agrees.  Prostate cancer screening NA due to age, he agrees.  Advance directive- wife designated if patient were incapacitated.   Syncope.  No events in the few months, since the event in May.  D/w pt.  Not on BP meds at his point.  He has seen Dr. Ubaldo Glassing with neg eval on monitor.  He has f/u with neuro tomorrow.   Off sildenafil in the meantime, except for 1 use in the meantime w/o ADE.  D/w pt.  His erections aren't good w/o sildenafil.  Unclear if the prev event was related to sildenafil use.   Labs pending.  Blood pressure has been similar to today on home check.  Not on any blood pressure medicines currently.  He has some tingling in the hands but not in the feet.  His grip is okay.  He has been using wrist braces at baseline.    Statin intolerant. D/w pt.    Carotid u/s:  1. Moderate (50-69%) stenosis proximal right internal carotid artery secondary to heterogeneous and irregular atherosclerotic plaque. 2. Mild (1-49%) stenosis proximal left internal carotid artery secondary to heterogeneous, irregular and ulcerated atherosclerotic plaque. 3. Vertebral arteries are patent with normal antegrade flow.  Echo: - Normal LVEF and mild diastolic dysfunction with fibrocalcified   aortic valve without much aortic stenosis and mild AR, moderate   TR/MR.  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 Again noted today w/o ulceration.   PMH and SH reviewed  ROS: Per HPI unless specifically indicated in ROS section   Meds, vitals, and allergies reviewed.   GEN: nad, alert and oriented HEENT: mucous membranes moist, mild changes on the tongue consistent with glossitis noted.  No ulceration. NECK: supple w/o LA CV: rrr PULM: ctab, no inc wob ABD: soft, +bs EXT: no edema SKIN: no acute rash  See notes on labs.

## 2018-01-23 NOTE — Progress Notes (Signed)
Subjective:   [name] is a 84 y.o. male who presents for Medicare Annual/Subsequent preventive examination.  Review of Systems:  N/A Cardiac Risk Factors include: advanced age (>54men, >44 women);male gender;dyslipidemia;hypertension     Objective:    Vitals: BP 122/72   Pulse (!) 52   Temp (!) 97.5 F (36.4 C) (Oral)   Ht 5\' 9"  (1.753 m)   Wt 146 lb 12 oz (66.6 kg)   SpO2 97%   BMI 21.67 kg/m   Body mass index is 21.67 kg/m.  Advanced Directives 01/23/2018 09/23/2017 06/13/2012  Does Patient Have a Medical Advance Directive? Yes Yes Patient has advance directive, copy not in chart  Type of Advance Directive Living will [place];Living will Living will  Does patient want to make changes to medical advance directive? - No - Patient declined -  Copy of [place] in Chart? - Yes -  Pre-existing out of facility DNR order (yellow form or pink MOST form) - - No    Tobacco Social History   Tobacco Use  Smoking Status Former Smoker  . Last attempt to quit: 05/17/1970  . Years since quitting: 47.7  Smokeless Tobacco Never Used  Tobacco Comment   quit 1972     Counseling given: Not Answered Comment: quit 1972   Clinical Intake:  Pre-visit preparation completed: Yes  Pain : No/denies pain Pain Score: 0-No pain     Nutritional Status: BMI of 19-24  Normal Nutritional Risks: None Diabetes: No  How often do you need to have someone help you when you read instructions, pamphlets, or other written materials from your doctor or pharmacy?: 1 - Never What is the last grade level you completed in school?: 12th grade + some college  Interpreter Needed?: No  Comments: pt lives with spouse Information entered by :: LPinson, LPN  Past Medical History:  Diagnosis Date  . BPH (benign prostatic hyperplasia) 06/1997  . Hyperlipemia 1990's  . Hypertension 02/2004  . TIA (transient ischemic attack)    was not on aspirin at time of  TIA per patient   Past Surgical History:  Procedure Laterality Date  . APPENDECTOMY  1947  . bilateral hernia repair  1965  . CHOLECYSTECTOMY  07/2003  . DG CHEST FOR [employer] EMPLOYEE  06/02-08/2006   TIA, Dyslipidemia, HTN, Right Sided Carotid Artery Stenosis 40-60%  . ESOPHAGOGASTRODUODENOSCOPY  03/23/2001   schatzki ring dilated, [facility] (Dr. Uvaldo Rising)  . ESOPHAGOGASTRODUODENOSCOPY  06/13/2012   Procedure: ESOPHAGOGASTRODUODENOSCOPY (EGD);  Surgeon: Jerene Bears, MD;  Location: [facility];  Service: Gastroenterology;  Laterality: N/A;  . holter monitor  10/17/2001   ISOL, PVC's, SVC's  . MRI Brain     nml, MRA brain, intracran athersc dz MCA RPCA 10/18/06, Head CT NML 10/17/06  . MRI Cerv Spine  10/18/2006   mild degen changes   Family History  Problem Relation Age of Onset  . Stroke Mother   . Diabetes Sister   . Heart disease Sister        CAD, AFib, GI bleed, FE deficiency  . Prostate cancer Brother   . Cancer Brother 36       lungs, smoker  . Colon cancer Neg Hx    Social History   Socioeconomic History  . Marital status: Married    Spouse name: Not on file  . Number of children: Not on file  . Years of education: Not on file  . Highest education level: Not on  file  Occupational History  . 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 Financial resource strain: Not on file  . Food insecurity:    Worry: Not on file    Inability: Not on file  . Transportation needs:    Medical: Not on file    Non-medical: Not on file  Tobacco Use  . Smoking status: Former Smoker    Last attempt to quit: 05/17/1970    Years since quitting: 47.7  . Smokeless tobacco: Never Used  . Tobacco comment: quit 1972  Substance and Sexual Activity  . Alcohol use: No  . Drug use: No  . Sexual activity: Yes  Lifestyle  . Physical activity:    Days per week: Not on file    Minutes per session: Not on file  . Stress: Not on file    Relationships  . Social connections:    Talks on phone: Not on file    Gets together: Not on file    Attends religious service: Not on file    Active member of club or organization: Not on file    Attends meetings of clubs or organizations: Not on file    Relationship status: Not on file  Other Topics Concern  . Not on file  Social History Narrative   Remarried 1985, lives with wife   4 stepdaughters   From [place]   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 acetaminophen (TYLENOL) 500 MG tablet Take 500 mg by mouth daily as needed.  Marland Kitchen aspirin 81 MG tablet Take 81 mg by mouth daily.   No facility-administered encounter medications on file as of 01/23/2018.     Activities of Daily Living In your present state of health, do you have any difficulty performing the following activities: 01/23/2018 09/23/2017  Hearing? Tempie Donning  Vision? N N  Difficulty concentrating or making decisions? Y N  Walking or climbing stairs? N N  Dressing or bathing? N N  Doing errands, shopping? N N  Preparing Food and eating ? N -  Using the Toilet? N -  In the past six months, have you accidently leaked urine? N -  Do you have problems with loss of bowel control? N -  Managing your Medications? N -  Managing your Finances? N -  Housekeeping or managing your Housekeeping? N -  Some recent data might be hidden    Patient Care Team: Tonia Ghent, MD as PCP - General (Family Medicine)   Assessment:   This is a routine wellness examination for [name].  Hearing Screening Comments: Bilateral hearing aids Vision Screening Comments: Vision exam in August 2019  Exercise Activities and Dietary recommendations Current Exercise Habits: The patient does not participate in regular exercise at present, Exercise limited by: None identified  Goals    . DIET - INCREASE WATER INTAKE     Starting  01/23/2018, I will attempt to drink at least 6-8 glasses of water daily.        Fall Risk Fall Risk  01/23/2018 01/13/2017 12/15/2015 12/13/2014 11/08/2013  Falls in the past year? Yes No No Yes Yes  Number falls in past yr: 1 - - 1 -  Injury with Fall? No - - No -   Depression Screen PHQ 2/9 Scores 01/23/2018 01/13/2017 12/15/2015  12/13/2014  PHQ - 2 Score 0 0 0 0    Cognitive Function MMSE - Mini Mental State Exam 01/23/2018  Orientation to time 5  Orientation to Place 5  Registration 3  Attention/ Calculation 0  Recall 2  Recall-comments unable to recall 1 of 3 words  Language- name 2 objects 0  Language- repeat 1  Language- follow 3 step command 3  Language- read & follow direction 0  Write a sentence 0  Copy design 0  Total score 19     PLEASE NOTE: A Mini-Cog screen was completed. Maximum score is 20. A value of 0 denotes this part of [place] MMSE was not completed or the patient failed this part of the Mini-Cog screening.   Mini-Cog Screening Orientation to Time - Max 5 pts Orientation to Place - Max 5 pts Registration - Max 3 pts Recall - Max 3 pts Language Repeat - Max 1 pts Language Follow 3 Step Command - Max 3 pts     Immunization History  Administered Date(s) Administered  . Influenza,inj,Quad PF,6+ Mos 03/20/2014, 01/23/2018  . Influenza-Unspecified 03/08/2016, 02/15/2017  . Pneumococcal Conjugate-13 12/13/2014  . Pneumococcal Polysaccharide-23 05/17/1998  . Td 08/05/2003  . Tdap 08/10/2013, 09/23/2017    Screening Tests Health Maintenance  Topic Date Due  . TETANUS/TDAP  09/24/2027  . INFLUENZA VACCINE  Completed  . PNA vac Low Risk Adult  Completed       Plan:   I have personally reviewed, addressed, and noted the following in the patient's chart:  A. Medical and social history B. Use of alcohol, tobacco or illicit drugs  C. Current medications and supplements D. Functional ability and status E.  Nutritional status F.  Physical  activity G. Advance directives H. List of other physicians I.  Hospitalizations, surgeries, and ER visits in previous 12 months J.  [place] to include hearing, vision, cognitive, depression L. Referrals and appointments - none  In addition, I have reviewed and discussed with patient certain preventive protocols, quality metrics, and best practice recommendations. A written personalized care plan for preventive services as well as general preventive health recommendations were provided to patient.  See attached scanned questionnaire for additional information.   Signed,   Lindell Noe, MHA, BS, LPN Health Coach

## 2018-01-23 NOTE — Progress Notes (Signed)
PCP notes:   Health maintenance:  Flu vaccine - administered  Abnormal screenings:   Fall risk - hx of single fall Fall Risk  01/23/2018 01/13/2017 12/15/2015 12/13/2014 11/08/2013  Falls in the past year? Yes No No Yes Yes  Number falls in past yr: 1 - - 1 -  Injury with Fall? No - - No -   Mini-Cog score: 19/20 MMSE - Mini Mental State Exam 01/23/2018  Orientation to time 5  Orientation to Place 5  Registration 3  Attention/ Calculation 0  Recall 2  Recall-comments unable to recall 1 of 3 words  Language- name 2 objects 0  Language- repeat 1  Language- follow 3 step command 3  Language- read & follow direction 0  Write a sentence 0  Copy design 0  Total score 19   Patient concerns:   None  Nurse concerns:  None  Next PCP appt:   N/A; CPE prior to AWV  I reviewed health advisor's note, was available for consultation on the day of service listed in this note, and agree with documentation and plan. Elsie Stain, MD.

## 2018-01-23 NOTE — Patient Instructions (Addendum)
I think it makes sense to get through the neurology appointment then consider options about sildenafil.  I may need to ask cardiology about that.    Go to the lab on the way out.  We'll contact you with your lab report. Go see Pinson on the way out.   Take care.  Glad to see you.

## 2018-01-26 DIAGNOSIS — Z0000 Encounter for general adult medical examination without abnormal findings: Secondary | ICD-10-CM | POA: Insufficient documentation

## 2018-01-26 DIAGNOSIS — K140 Glossitis: Secondary | ICD-10-CM | POA: Insufficient documentation

## 2018-01-26 NOTE — Assessment & Plan Note (Signed)
Advance directive- wife designated if patient were incapacitated.  

## 2018-01-26 NOTE — Assessment & Plan Note (Signed)
No more events in the meantime.  I want to see the neurology notes before we do anything else differently.  Previous work-up discussed with patient as above. >25 minutes spent in face to face time with patient, >50% spent in counselling or coordination of care.

## 2018-01-26 NOTE — Assessment & Plan Note (Signed)
Continue using braces for likely carpal tunnel symptoms in the meantime.  He agrees.

## 2018-01-26 NOTE — Assessment & Plan Note (Signed)
Unclear if previous episode of syncope was related to sildenafil use.  He is going to follow-up with neurology in the near future.  I want to await those notes and we will go from there.  Discussed with patient.  He agrees.

## 2018-01-26 NOTE — Assessment & Plan Note (Signed)
Statin intolerant.  Continue work on diet.

## 2018-01-26 NOTE — Assessment & Plan Note (Signed)
No ulceration.  Reasonable for observation for now.  He agrees.  If he does have migratory glossitis then it may resolve on his own.  He understood.

## 2018-01-26 NOTE — Assessment & Plan Note (Signed)
Flu encouraged.   Shingles d/w pt. Out of stock.   PNA up to date Tetanus 2015 Colonoscopy NA due to age, he agrees.  Prostate cancer screening NA due to age, he agrees.  Advance directive- wife designated if patient were incapacitated.

## 2018-02-05 ENCOUNTER — Telehealth: Payer: Self-pay | Admitting: Family Medicine

## 2018-02-05 NOTE — Telephone Encounter (Signed)
Patient was going to f/u with Dr. Manuella Ghazi with neuro and then I was going to check that note about possible sildenafil use.  I don't see notes from Dr. Manuella Ghazi.  Did patient have f/u?  If so, please request notes.  If not, then please find out when he'll have f/u.  Please let me know either way.  Thanks.

## 2018-02-06 NOTE — Telephone Encounter (Signed)
Patient notified as instructed by telephone and verbalized understanding. Patient stated that he is not interested in getting a script for sildenafil at this time. Patient stated that he saw Dr. Manuella Ghazi on 01/24/18. Called and spoke to [place] at Dr. Trena Platt office and she faxed notes over. Notes are on your desk for review.

## 2018-02-08 NOTE — Telephone Encounter (Signed)
Noted.  I will review the hardcopy.  Thanks.

## 2018-02-15 ENCOUNTER — Other Ambulatory Visit: Payer: Self-pay | Admitting: Family Medicine

## 2018-02-15 NOTE — Telephone Encounter (Signed)
Patient notified as instructed by telephone and verbalized understanding. Patient stated that he has been off of it for a few days, but so far he has not needed it. Patient stated that if he starts back having problems and feels that he needs it he will call back and let you know.

## 2018-02-15 NOTE — Telephone Encounter (Signed)
Noted. Thanks.

## 2018-02-15 NOTE — Telephone Encounter (Signed)
Electronic refill request Pantoprazole Last office visit 01/23/18 Medication is no on medication list.

## 2018-02-15 NOTE — Telephone Encounter (Signed)
I thought patient was off this medication.  Please verify with patient.  I went ahead and denied it in the meantime.  Thanks.

## 2018-03-01 ENCOUNTER — Other Ambulatory Visit: Payer: Self-pay | Admitting: Family Medicine

## 2018-08-11 ENCOUNTER — Encounter: Payer: Self-pay | Admitting: Family Medicine

## 2018-08-11 DIAGNOSIS — K219 Gastro-esophageal reflux disease without esophagitis: Secondary | ICD-10-CM | POA: Insufficient documentation

## 2018-08-28 ENCOUNTER — Other Ambulatory Visit: Payer: Self-pay | Admitting: Family Medicine

## 2018-09-21 ENCOUNTER — Telehealth: Payer: Self-pay | Admitting: Family Medicine

## 2018-09-21 NOTE — Telephone Encounter (Signed)
Please call patient.  Note from patient asking if he still needs to continue Protonix.  He is not having any indigestion or trouble when taking the medication.  Given that he has a history of a Schatzki's ring and hiatal hernia, I would continue medication for now.  Let me know if he has any trouble.  Let me know if he has any concerns or if he needs a refill.  Also, he had a carotid ultrasound done last year.  It would normally be reasonable to consider repeating it now.  I would defer this because of the pandemic.  I put a reminder in the EMR for July and we can consider then.    And please tell him I appreciated his note and I want him to take care with ongoing pandemic.  Thanks.

## 2018-09-22 NOTE — Telephone Encounter (Signed)
Left message for patient to call back  

## 2018-09-25 NOTE — Telephone Encounter (Signed)
Left message on patient's voicemail to return call

## 2018-09-29 NOTE — Telephone Encounter (Signed)
Spoke with patient. Updated phone number. Advised as below.

## 2018-10-12 ENCOUNTER — Other Ambulatory Visit: Payer: Self-pay | Admitting: Family Medicine

## 2018-10-13 NOTE — Telephone Encounter (Signed)
Electronic refill request  Last office visit 01/23/18 Medication is no longer on list

## 2018-10-14 NOTE — Telephone Encounter (Signed)
Please verify with patient.  I thought that he had stopped this medication.  This may have been an automatic refill.  Thanks.

## 2018-10-16 NOTE — Telephone Encounter (Signed)
Patient says he has been out of the medication but has not discontinued it.  Patient requests refill, please.

## 2018-10-17 NOTE — Telephone Encounter (Signed)
Sent. Thanks.   

## 2018-10-31 IMAGING — CT CT HEAD W/O CM
3 series · 16 of 46 positions shown, 19 images · non-contrast
Comparison: Prior MRI from 10/17/2006.

CLINICAL DATA: Initial evaluation for acute altered mental status.
Syncope.

EXAM:
CT HEAD WITHOUT CONTRAST
TECHNIQUE: Contiguous axial images were obtained from the base of the skull
through the vertex without intravenous contrast.

[Series 3: head wo · axial · 0.41mm/px · z∈[-72,+48]mm · 10 of 29 slices shown, 13 images]
[im 3/29  brain]
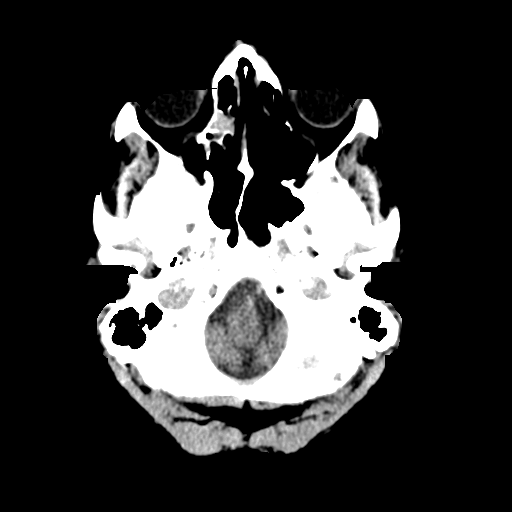
[im 3/29  bone]
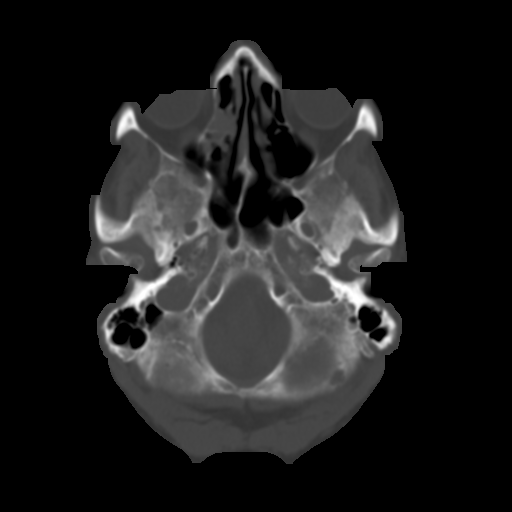
[im 6/29  brain]
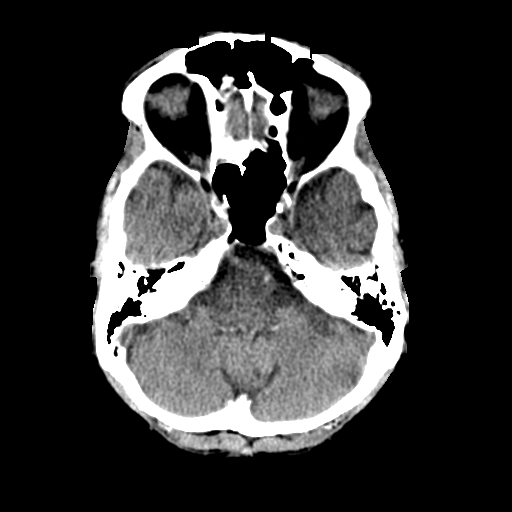
[im 8/29  brain]
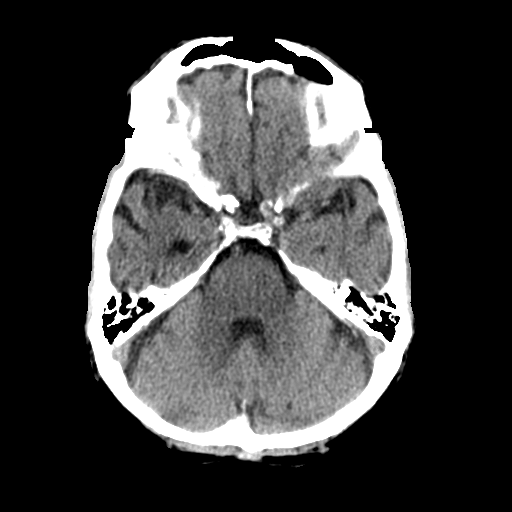
[im 11/29  brain]
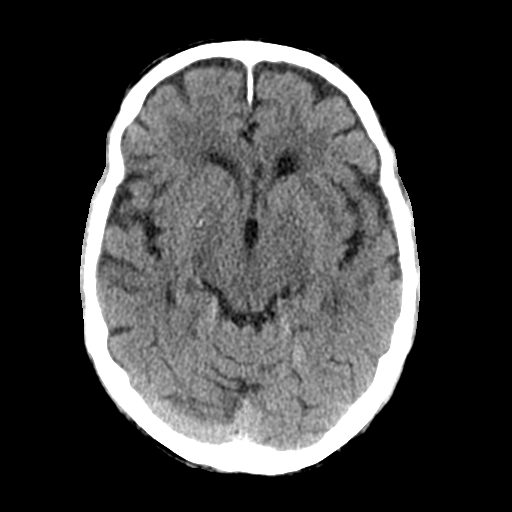
[im 14/29  brain]
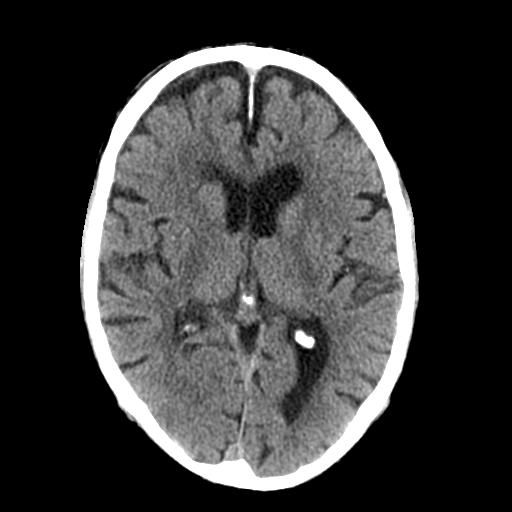
[im 14/29  bone]
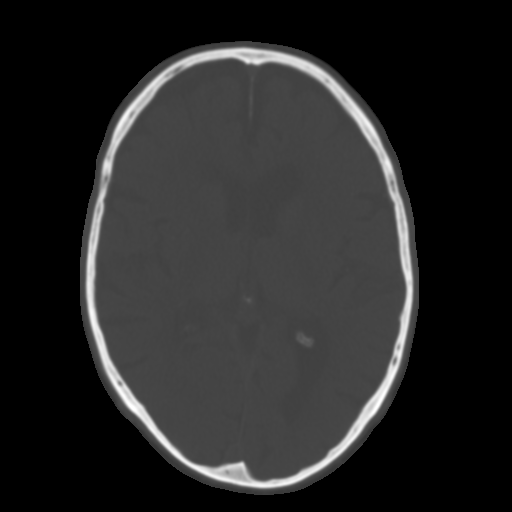
[im 16/29  brain]
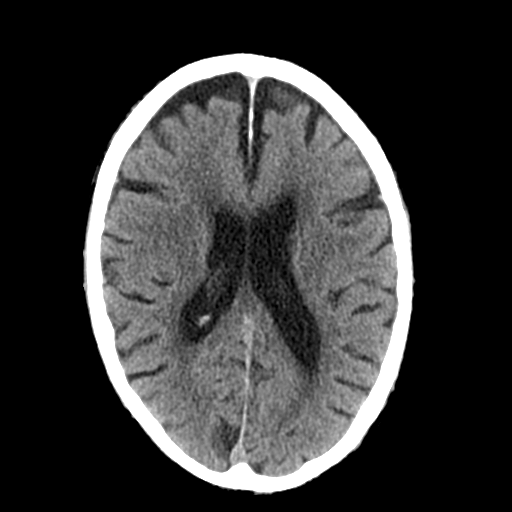
[im 19/29  brain]
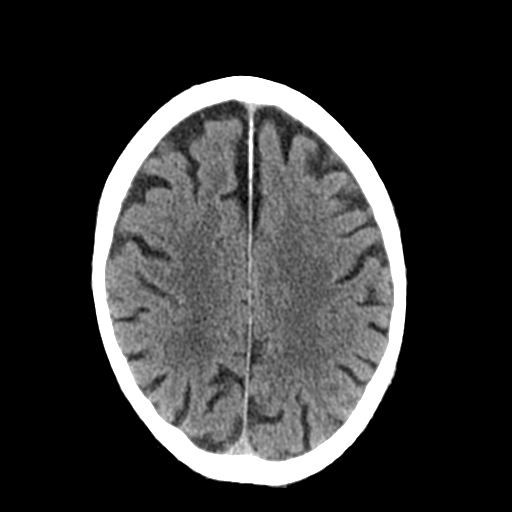
[im 22/29  brain]
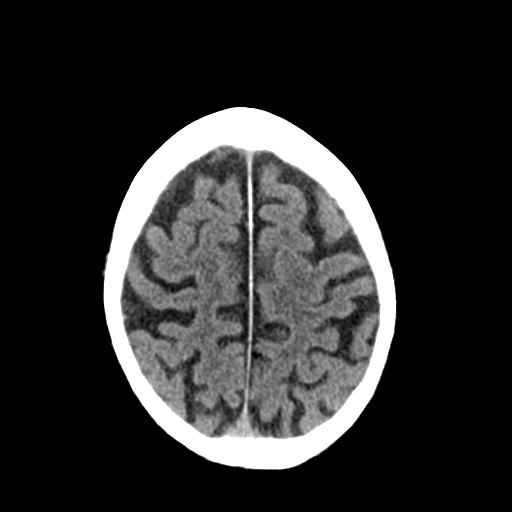
[im 24/29  brain]
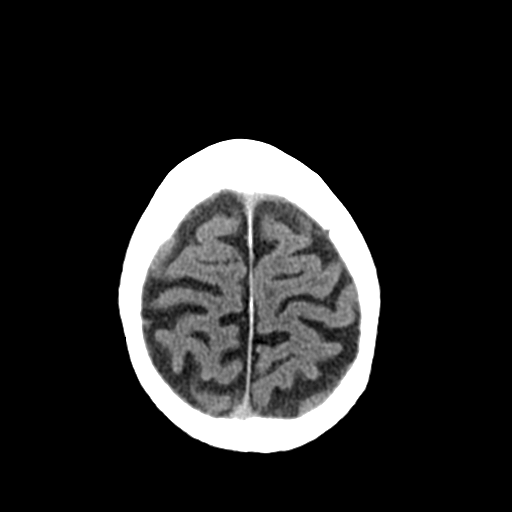
[im 24/29  bone]
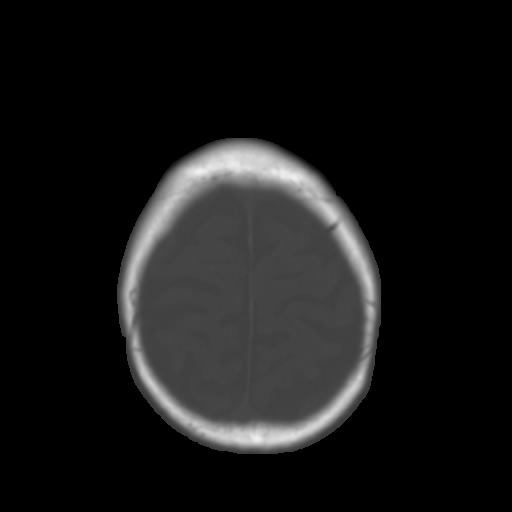
[im 27/29  brain]
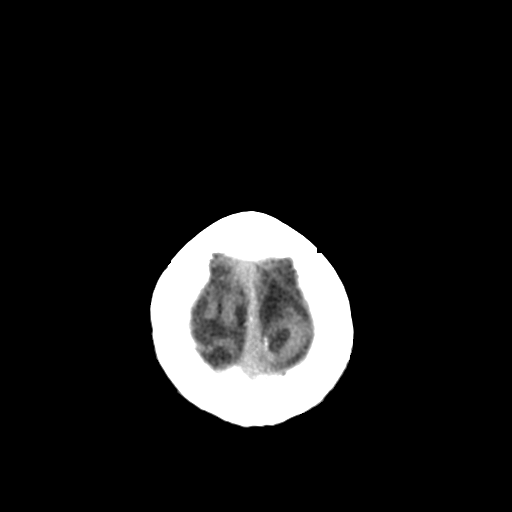

[Series 4: coronal soft tissue · coronal · 0.29mm/px · 3 of 64 slices shown]
[im 22/64  brain]
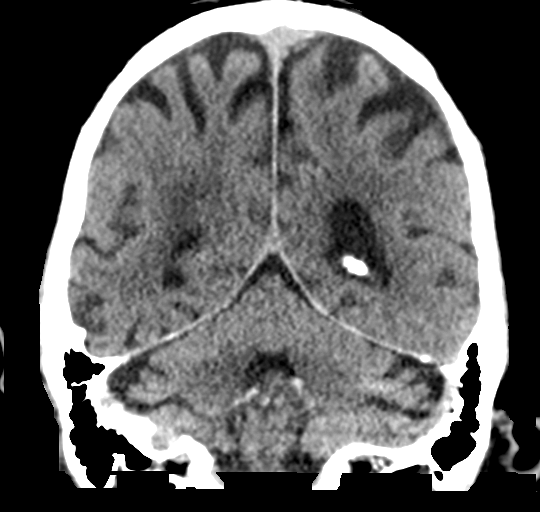
[im 29/64  brain]
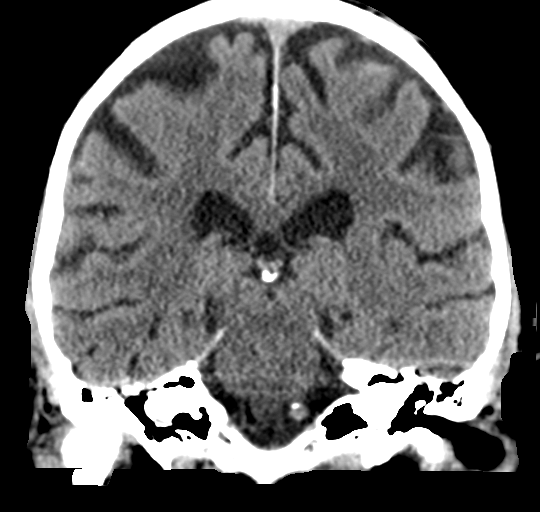
[im 36/64  brain]
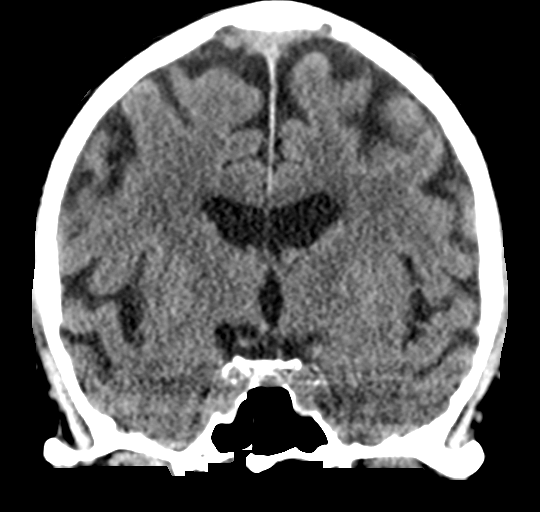

[Series 5: sagittal soft tissue · sagittal · 0.30mm/px · 3 of 45 slices shown]
[im 15/45  brain]
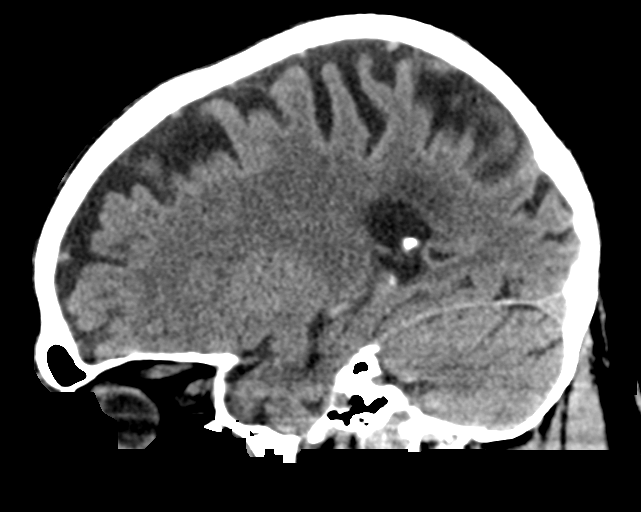
[im 23/45  brain]
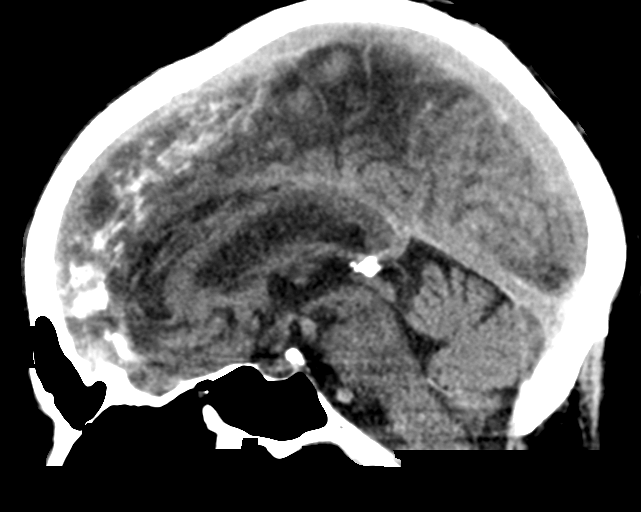
[im 30/45  brain]
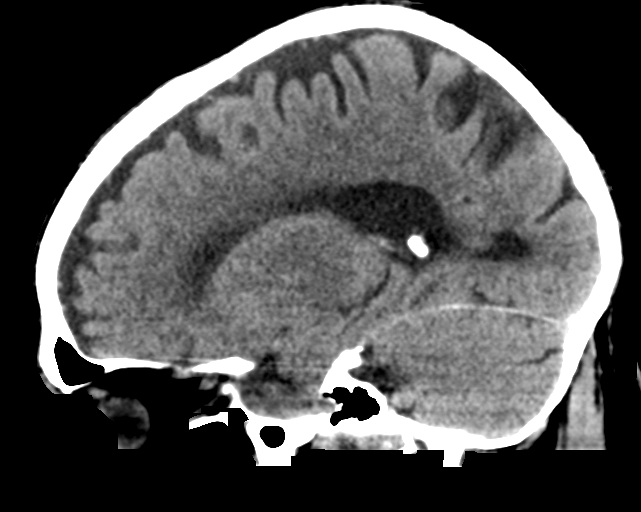

[16 of 46 positions shown; findings below may reference images not displayed]

FINDINGS: Brain: Generalized age-related cerebral atrophy with mild chronic
small vessel ischemic disease. No acute intracranial hemorrhage. No
acute large vessel territory infarct. No mass lesion, midline shift
or mass effect. No hydrocephalus. No extra-axial fluid collection.

Vascular: No hyperdense vessel. Calcified atherosclerotic change
present at the skull base.

Skull: No appreciable scalp soft tissue injury.  Calvarium intact.

Sinuses/Orbits: Globes and orbital soft tissues within normal
limits. Chronic opacification of the right ethmoidal air cells.
Paranasal sinuses are otherwise clear. No mastoid effusion.

Other: None.
IMPRESSION: 1. No acute intracranial abnormality.
2. Age-related cerebral atrophy with mild chronic small vessel
ischemic disease.

## 2018-11-26 ENCOUNTER — Telehealth: Payer: Self-pay | Admitting: Family Medicine

## 2018-11-26 DIAGNOSIS — I6529 Occlusion and stenosis of unspecified carotid artery: Secondary | ICD-10-CM

## 2018-11-26 NOTE — Telephone Encounter (Signed)
Please check with patient.  I had put in a reminder about potentially repeating his carotid ultrasound.  Under normal circumstances it would be reasonable to consider doing this now.  How does he feel about going for this given the pandemic?  Please let me know what he thinks.  Thanks.

## 2018-11-27 NOTE — Telephone Encounter (Signed)
Left detailed message on voicemail to return call. 

## 2018-11-28 NOTE — Telephone Encounter (Signed)
Patient says he is willing to do the [place] now but they have been trying to be especially careful about getting out during this pandemic.  He states, however, that if he needs the [place] now, he is willing to do it.

## 2018-11-29 NOTE — Telephone Encounter (Signed)
I would not say that he has to do it right now.  My understanding is that these ultrasounds are still being done on an outpatient basis so the general thought amongst the medical community is that it is safe enough to be done at this point.  However if he is completely alarmed at the process of going for the imaging then we should defer it.  I really wanted to get his opinion about his level of comfort.  Please let me know.  If he wants to do it now, I will put in the order.  If he wants to defer it another few months then we can table the discussion and I will put a reminder in the EMR.  Thanks.

## 2018-11-30 NOTE — Telephone Encounter (Signed)
Patient called in and said that he does want to have this done.

## 2018-11-30 NOTE — Telephone Encounter (Signed)
Spoke with wife who will talk to the patient and call in with an answer.

## 2018-12-03 NOTE — Telephone Encounter (Signed)
[facility] location do you want me to use on this carotid ultrasound order?  Please let me know and I will put it in.  Thanks.

## 2018-12-04 NOTE — Telephone Encounter (Signed)
Choose [facility] location for Carotid

## 2018-12-05 NOTE — Telephone Encounter (Signed)
Done. Thanks.

## 2018-12-05 NOTE — Addendum Note (Signed)
Addended by: Tonia Ghent on: 12/05/2018 06:57 AM   Modules accepted: Orders

## 2018-12-20 ENCOUNTER — Other Ambulatory Visit: Payer: Self-pay

## 2018-12-20 ENCOUNTER — Ambulatory Visit (INDEPENDENT_AMBULATORY_CARE_PROVIDER_SITE_OTHER): Payer: Medicare Other

## 2018-12-20 DIAGNOSIS — I6529 Occlusion and stenosis of unspecified carotid artery: Secondary | ICD-10-CM | POA: Diagnosis not present

## 2018-12-21 ENCOUNTER — Ambulatory Visit
Admission: RE | Admit: 2018-12-21 | Discharge: 2018-12-21 | Disposition: A | Discharge status: Home / Self Care | Payer: Medicare Other | Source: Ambulatory Visit | Attending: Family Medicine | Admitting: Family Medicine

## 2018-12-21 ENCOUNTER — Ambulatory Visit (INDEPENDENT_AMBULATORY_CARE_PROVIDER_SITE_OTHER): Payer: Medicare Other | Admitting: Family Medicine

## 2018-12-21 ENCOUNTER — Encounter: Payer: Self-pay | Admitting: Family Medicine

## 2018-12-21 ENCOUNTER — Other Ambulatory Visit: Payer: Self-pay | Admitting: Family Medicine

## 2018-12-21 VITALS — BP 158/58 | HR 62 | Temp 98.6°F | Ht 69.0 in | Wt 155.0 lb

## 2018-12-21 DIAGNOSIS — M7989 Other specified soft tissue disorders: Secondary | ICD-10-CM | POA: Diagnosis not present

## 2018-12-21 DIAGNOSIS — I6529 Occlusion and stenosis of unspecified carotid artery: Secondary | ICD-10-CM | POA: Diagnosis not present

## 2018-12-21 DIAGNOSIS — F528 Other sexual dysfunction not due to a substance or known physiological condition: Secondary | ICD-10-CM

## 2018-12-21 DIAGNOSIS — R21 Rash and other nonspecific skin eruption: Secondary | ICD-10-CM

## 2018-12-21 DIAGNOSIS — I6523 Occlusion and stenosis of bilateral carotid arteries: Secondary | ICD-10-CM

## 2018-12-21 DIAGNOSIS — K222 Esophageal obstruction: Secondary | ICD-10-CM | POA: Diagnosis not present

## 2018-12-21 MED ORDER — PREDNISONE 10 MG PO TABS: ORAL_TABLET | ORAL | 0 refills | Status: DC

## 2018-12-21 NOTE — Progress Notes (Signed)
Carotid u/s d/w pt.  See result note.  Pt aware.   No significant change on the ultrasound. Consider recheck in 1 year. I made a note in the EMR about this.   D/w pt at PPI use.  H/o esophageal ring and HH noted.  No sx while on med.  Swallowing well.  D/w pt about risk and benefit of stopping/continuing.  He wanted to continue for now.  This is reasonable.  Rash.  B leg rash and arms on the trunk.  Symmetric.  Was in the yard about 2 weeks ago. Some itching after yard work.  "I didn't really pay a lot of attention to it at the time."  Itches day and night.  No lesion on hands.  No lesions like this prev.  No other lesions.  No FCNAVD.  No others with similar.   ED.  Erectile function is better with med.  No CP.  Not lightheaded.  He has tolerated med.  At this point, it makes sense to continue prn use.  No other episodes of syncope.    He had a L knee cramp with subsequent puffiness thereafter.  No tick bite hx.   He normally wears a unilateral R compression stocking at baseline.    Meds, vitals, and allergies reviewed.   ROS: Per HPI unless specifically indicated in ROS section   nad ncat Neck supple, no LA L knee is puffy but not ttp.  Blanching maculopapular B rash on trunk and ext x4, spares the palms.  rrr ctab L calf 36cm  R calf 33cm in compression stocking.

## 2018-12-21 NOTE — Patient Instructions (Signed)
We'll check you for a clot today with an ultrasound.   If that is positive, then we'll address it.  If negative, then start prednisone with food for the rash.  Take care.  Glad to see you.

## 2018-12-25 DIAGNOSIS — M7989 Other specified soft tissue disorders: Secondary | ICD-10-CM | POA: Insufficient documentation

## 2018-12-25 NOTE — Assessment & Plan Note (Signed)
Carotid u/s d/w pt.  See result note.  Pt aware.   No significant change on the ultrasound. Consider recheck in 1 year. I made a note in the EMR about this.

## 2018-12-25 NOTE — Assessment & Plan Note (Signed)
Erectile function is better with med.  No CP.  Not lightheaded.  He has tolerated med.  At this point, it makes sense to continue prn use.  No other episodes of syncope.    Continue as is.  He agrees.

## 2018-12-25 NOTE — Assessment & Plan Note (Addendum)
Discussed with patient about evaluation for DVT.  Sent for imaging.  Negative for deep venous thrombosis in left lower extremity. Reasonable to elevate leg and update me as needed. >25 minutes spent in face to face time with patient, >50% spent in counselling or coordination of care.

## 2018-12-25 NOTE — Assessment & Plan Note (Signed)
Unclear source, but no ominous signs given that his leg ultrasound was negative.  Appears to be separate from the other issues going on.  Still okay for outpatient follow-up.  Okay to start prednisone.  Unclear if this was an exposure from working in the yard.  He agreed with plan.

## 2018-12-25 NOTE — Assessment & Plan Note (Signed)
Would continue PPI for now.  See above.  He agrees.

## 2019-02-01 ENCOUNTER — Ambulatory Visit (INDEPENDENT_AMBULATORY_CARE_PROVIDER_SITE_OTHER): Payer: Medicare Other | Admitting: Family Medicine

## 2019-02-01 ENCOUNTER — Ambulatory Visit: Payer: Medicare Other

## 2019-02-01 ENCOUNTER — Other Ambulatory Visit: Payer: Self-pay

## 2019-02-01 ENCOUNTER — Encounter: Payer: Self-pay | Admitting: Family Medicine

## 2019-02-01 VITALS — BP 122/72 | HR 61 | Temp 97.7°F | Ht 69.0 in | Wt 150.1 lb

## 2019-02-01 DIAGNOSIS — K222 Esophageal obstruction: Secondary | ICD-10-CM | POA: Diagnosis not present

## 2019-02-01 DIAGNOSIS — Z23 Encounter for immunization: Secondary | ICD-10-CM | POA: Diagnosis not present

## 2019-02-01 DIAGNOSIS — Z0000 Encounter for general adult medical examination without abnormal findings: Secondary | ICD-10-CM

## 2019-02-01 DIAGNOSIS — E785 Hyperlipidemia, unspecified: Secondary | ICD-10-CM

## 2019-02-01 DIAGNOSIS — Z7189 Other specified counseling: Secondary | ICD-10-CM

## 2019-02-01 LAB — LIPID PANEL
Cholesterol: 175 mg/dL (ref 0–200)
HDL: 35.9 mg/dL — ABNORMAL LOW (ref >39.00)
LDL Cholesterol: 107 mg/dL — ABNORMAL HIGH (ref 0–99)
NonHDL: 139.24
Total CHOL/HDL Ratio: 5
Triglycerides: 161 mg/dL — ABNORMAL HIGH (ref 0.0–149.0)
VLDL: 32.2 mg/dL (ref 0.0–40.0)

## 2019-02-01 LAB — COMPREHENSIVE METABOLIC PANEL
ALT: 19 U/L (ref 0–53)
AST: 24 U/L (ref 0–37)
Albumin: 4.5 g/dL (ref 3.5–5.2)
Alkaline Phosphatase: 76 U/L (ref 39–117)
BUN: 15 mg/dL (ref 6–23)
CO2: 32 mEq/L (ref 19–32)
Calcium: 9.8 mg/dL (ref 8.4–10.5)
Chloride: 102 mEq/L (ref 96–112)
Creatinine, Ser: 0.94 mg/dL (ref 0.40–1.50)
GFR: 74.92 mL/min (ref >60.00)
Glucose, Bld: 97 mg/dL (ref 70–99)
Potassium: 4.2 mEq/L (ref 3.5–5.1)
Sodium: 139 mEq/L (ref 135–145)
Total Bilirubin: 2.1 mg/dL — ABNORMAL HIGH (ref 0.2–1.2)
Total Protein: 7.1 g/dL (ref 6.0–8.3)

## 2019-02-01 LAB — CBC WITH DIFFERENTIAL/PLATELET
Basophils Absolute: 0 10*3/uL (ref 0.0–0.1)
Basophils Relative: 0.5 % (ref 0.0–3.0)
Eosinophils Absolute: 0.1 10*3/uL (ref 0.0–0.7)
Eosinophils Relative: 2.5 % (ref 0.0–5.0)
HCT: 42.8 % (ref 39.0–52.0)
Hemoglobin: 14.7 g/dL (ref 13.0–17.0)
Lymphocytes Relative: 36.7 % (ref 12.0–46.0)
Lymphs Abs: 1.9 10*3/uL (ref 0.7–4.0)
MCHC: 34.4 g/dL (ref 30.0–36.0)
MCV: 95.2 fl (ref 78.0–100.0)
Monocytes Absolute: 0.7 10*3/uL (ref 0.1–1.0)
Monocytes Relative: 14.2 % — ABNORMAL HIGH (ref 3.0–12.0)
Neutro Abs: 2.4 10*3/uL (ref 1.4–7.7)
Neutrophils Relative %: 46.1 % (ref 43.0–77.0)
Platelets: 190 10*3/uL (ref 150.0–400.0)
RBC: 4.5 Mil/uL (ref 4.22–5.81)
RDW: 13.2 % (ref 11.5–15.5)
WBC: 5.2 10*3/uL (ref 4.0–10.5)

## 2019-02-01 MED ORDER — PANTOPRAZOLE SODIUM 40 MG PO TBEC: 40.0000 mg | DELAYED_RELEASE_TABLET | Freq: Every day | ORAL | 3 refills | Status: DC

## 2019-02-01 NOTE — Progress Notes (Signed)
I have personally reviewed the Medicare Annual Wellness questionnaire and have noted 1. The patient's medical and social history 2. Their use of alcohol, tobacco or illicit drugs 3. Their current medications and supplements 4. The patient's functional ability including ADL's, fall risks, home safety risks and hearing or visual             impairment. 5. Diet and physical activities 6. Evidence for depression or mood disorders  The patients weight, height, BMI have been recorded in the chart and visual acuity is per eye clinic.  I have made referrals, counseling and provided education to the patient based review of the above and I have provided the pt with a written personalized care plan for preventive services.  Provider list updated- see scanned forms.  Routine anticipatory guidance given to patient.  See health maintenance. The possibility exists that previously documented standard health maintenance information may have been brought forward from a previous encounter into this note.  If needed, that same information has been updated to reflect the current situation based on today's encounter.    Flu 2020.   Shingles d/w pt, encouraged, see AVS.  PNA up to date Tetanus 2019 Colonoscopy NA due to age, he agrees.  Prostate cancer screening NA due to age, he agrees.  Advance directive- wife designated if patient were incapacitated.  Cognitive function addressed- see scanned forms- and if abnormal then additional documentation follows.   Minimal memory changes noted.  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  He has normal testing today with 3 out of 3 recall with one prompt.  We agreed to monitor and he will update me as needed.  No red flag symptoms.  History of mild carotid changes noted.  See notes on labs.  Still on PPI.  H/o esophageal ring.  No dysphagia unless except for "an occasional hiccup with cereal but that doesn't happen often."  No ADE on  med.  No blood in stool.  No black stools.  No abd pain.  No food sticking with eating.    Prev rash clearly improved/resolved with prednisone, no ADE on med.   PMH and SH reviewed  Meds, vitals, and allergies reviewed.   ROS: Per HPI.  Unless specifically indicated otherwise in HPI, the patient denies:  General: fever. Eyes: acute vision changes ENT: sore throat Cardiovascular: chest pain Respiratory: SOB GI: vomiting GU: dysuria Musculoskeletal: acute back pain Derm: acute rash Neuro: acute motor dysfunction Psych: worsening mood Endocrine: polydipsia Heme: bleeding Allergy: hayfever  GEN: nad, alert and oriented HEENT: ncat NECK: supple w/o LA CV: rrr. PULM: ctab, no inc wob ABD: soft, +bs EXT: trace BLE edema- would observe for now given minimal findings, d/w pt.  SKIN: no acute rash  Health Maintenance  Topic Date Due  . TETANUS/TDAP  09/24/2027  . INFLUENZA VACCINE  Completed  . PNA vac Low Risk Adult  Completed

## 2019-02-01 NOTE — Patient Instructions (Addendum)
Check with your insurance to see if they will cover the shingrix shot. If you notice any more memory changes, then let me know.  Go to the lab on the way out.  We'll contact you with your lab report. Take care.  Glad to see you.

## 2019-02-05 NOTE — Assessment & Plan Note (Signed)
Still on PPI.  H/o esophageal ring.  No dysphagia unless except for "an occasional hiccup with cereal but that doesn't happen often."  No ADE on med.  No blood in stool.  No black stools.  No abd pain.  No food sticking with eating.   Would continue as is.

## 2019-02-05 NOTE — Assessment & Plan Note (Signed)
Flu 2020.   Shingles d/w pt, encouraged, see AVS.  PNA up to date Tetanus 2019 Colonoscopy NA due to age, he agrees.  Prostate cancer screening NA due to age, he agrees.  Advance directive- wife designated if patient were incapacitated.  Cognitive function addressed- see scanned forms- and if abnormal then additional documentation follows.

## 2019-02-05 NOTE — Assessment & Plan Note (Signed)
Advance directive- wife designated if patient were incapacitated.  

## 2019-03-15 ENCOUNTER — Ambulatory Visit (INDEPENDENT_AMBULATORY_CARE_PROVIDER_SITE_OTHER): Payer: Medicare Other | Admitting: Family Medicine

## 2019-03-15 ENCOUNTER — Encounter: Payer: Self-pay | Admitting: Family Medicine

## 2019-03-15 ENCOUNTER — Telehealth: Payer: Self-pay

## 2019-03-15 ENCOUNTER — Other Ambulatory Visit: Payer: Self-pay

## 2019-03-15 VITALS — BP 130/70 | HR 60 | Temp 97.4°F | Ht 69.0 in | Wt 150.1 lb

## 2019-03-15 DIAGNOSIS — R21 Rash and other nonspecific skin eruption: Secondary | ICD-10-CM | POA: Diagnosis not present

## 2019-03-15 DIAGNOSIS — R55 Syncope and collapse: Secondary | ICD-10-CM

## 2019-03-15 DIAGNOSIS — I499 Cardiac arrhythmia, unspecified: Secondary | ICD-10-CM

## 2019-03-15 MED ORDER — TRIAMCINOLONE ACETONIDE 0.5 % EX CREA: 1.0000 "application " | TOPICAL_CREAM | Freq: Two times a day (BID) | CUTANEOUS | 1 refills | Status: DC | PRN

## 2019-03-15 NOTE — Telephone Encounter (Signed)
See OV note.  Thanks.  

## 2019-03-15 NOTE — Patient Instructions (Addendum)
Don't take any more viagra or sildenafil.  Use the cream on your ankle if needed.  Let me update Dr. Ubaldo Glassing with cardiology.  Take care.  Glad to see you.

## 2019-03-15 NOTE — Progress Notes (Signed)
Syncope vs near syncope.  Noted a few hours after taking sildenafil when he was trying to get up from bed.  EMS called.  No typical SZ activity.  He was able to speak at the time.  By the time of EMS eval, he was feeling fine.  No sx in the meantime. No CP, SOB, BLE edema in the meantime.  He was raking leaves and mowing yesterday w/o troubles.  No FCNAVD.  Not lightheaded now on standing.   He has been putting lotion on his L calf and ankle for irritation.  Stinging/shocking pain locally.  Rash prev resolved with prednisone.    Paresthesia on hands better with warm/heat.  Using splints with some relief.    Meds, vitals, and allergies reviewed.   ROS: Per HPI unless specifically indicated in ROS section   GEN: nad, alert and oriented HEENT: ncat NECK: supple w/o LA CV: rrr with occasional ectopy noted.  Not tachycardic. PULM: ctab, no inc wob ABD: soft, +bs EXT: no edema SKIN: Local superficial skin irritation noted on the left ankle/calf.  No ulceration.

## 2019-03-15 NOTE — Telephone Encounter (Signed)
Pt mailed Dr Damita Dunnings a letter dated 03/05/19. In the letter pt noted on 02/09/19 pt had fainting spell. Night before pt had taken 2 sildenafils, pt noted things progressed normally and then he fell asleep to awake the next morning at 7 AM disorientated, pt passed out and difficulty breathing; ambulance came but by the tiime the ambulance got there pt was awake and lucid and refused to go to ED. Pt was advised his O2 level and BP was low.I spoke with pt's wife and has not taken BP. Since EMT advised that pts BP and O2 level was low pts wife scheduled in office appt with Dr Damita Dunnings today at 11:30. Pt has no covid symptoms, no travel and no known exposure to + covid. copyof letter from pt sent for scanning and copy given to [facility] CMA in case needed.

## 2019-03-20 NOTE — Assessment & Plan Note (Signed)
Versus near syncope after taking sildenafil.  Advised him not to use anymore sildenafil in the meantime.  EKG without acute changes but he does have PVCs noted.  I suspect the sildenafil was the main issue with relative hypotension noted after use.  We will update cardiology just on principle.  I would appreciate any other input cardiology had.  Discussed with patient.  He agrees with plan. >25 minutes spent in face to face time with patient, >50% spent in counselling or coordination of care.

## 2019-03-20 NOTE — Assessment & Plan Note (Signed)
This looks like superficial skin irritation and he can use triamcinolone cream and update me as needed.

## 2019-09-03 ENCOUNTER — Other Ambulatory Visit: Payer: Self-pay

## 2019-09-03 ENCOUNTER — Ambulatory Visit: Payer: Medicare Other | Admitting: Family Medicine

## 2019-09-03 ENCOUNTER — Encounter: Payer: Self-pay | Admitting: Family Medicine

## 2019-09-03 DIAGNOSIS — S51019A Laceration without foreign body of unspecified elbow, initial encounter: Secondary | ICD-10-CM | POA: Diagnosis not present

## 2019-09-03 NOTE — Patient Instructions (Signed)
Use nonstick bandages and then roll gauze over that.  Please update me as needed.  Take care.  Glad to see you.

## 2019-09-03 NOTE — Progress Notes (Signed)
This visit occurred during the SARS-CoV-2 public health emergency.  Safety protocols were in place, including screening questions prior to the visit, additional usage of staff PPE, and extensive cleaning of exam room while observing appropriate contact time as indicated for disinfecting solutions.  L arm injury.  Skin tear on 08/31/19.  He was weeding the flower bed, sitting on a stool and reaching down.  He briefly got up then was trying to sit back down and missed his seat, swung his arm and scraped the L elbow area.  Was able to get up.  No LOC.  No syncope. No head trauma.  He went into the house and cleaned the area.  Local skin tear noted by patient.  Bandaged in the meantime.  Tetanus 2019.   He had neurology f/u pending.  He has numbness and grip changes.    Meds, vitals, and allergies reviewed.   ROS: Per HPI unless specifically indicated in ROS section   nad ncat Neck supple, no LA Spine not tender to palpation in the midline posteriorly. Normal left shoulder elbow and wrist range of motion.  Normal grip. He has a clean appearing 1.5 x 1 cm superficial skin tear near the left elbow without spreading erythema or discharge.  Cover with Neosporin, nonstick bandage and roll gauze.

## 2019-09-05 DIAGNOSIS — S51019A Laceration without foreign body of unspecified elbow, initial encounter: Secondary | ICD-10-CM | POA: Insufficient documentation

## 2019-09-05 NOTE — Assessment & Plan Note (Signed)
Distally neurovascular intact and no need for imaging at this point.  Keep covered with nonstick bandage and update me as needed.  He agrees.  Routine cautions given to patient.

## 2019-12-10 ENCOUNTER — Other Ambulatory Visit: Payer: Self-pay | Admitting: Family Medicine

## 2019-12-23 ENCOUNTER — Telehealth: Payer: Self-pay | Admitting: Family Medicine

## 2019-12-23 DIAGNOSIS — I6529 Occlusion and stenosis of unspecified carotid artery: Secondary | ICD-10-CM

## 2019-12-23 NOTE — Telephone Encounter (Signed)
Opened in error

## 2019-12-26 DIAGNOSIS — C4491 Basal cell carcinoma of skin, unspecified: Secondary | ICD-10-CM | POA: Insufficient documentation

## 2020-01-14 ENCOUNTER — Ambulatory Visit (INDEPENDENT_AMBULATORY_CARE_PROVIDER_SITE_OTHER): Payer: Medicare Other

## 2020-01-14 ENCOUNTER — Other Ambulatory Visit: Payer: Self-pay

## 2020-01-14 DIAGNOSIS — I6523 Occlusion and stenosis of bilateral carotid arteries: Secondary | ICD-10-CM | POA: Diagnosis not present

## 2020-02-15 ENCOUNTER — Telehealth: Payer: Self-pay

## 2020-02-15 ENCOUNTER — Other Ambulatory Visit: Payer: Self-pay | Admitting: Family Medicine

## 2020-02-15 DIAGNOSIS — E785 Hyperlipidemia, unspecified: Secondary | ICD-10-CM

## 2020-02-15 NOTE — Telephone Encounter (Signed)
Unable to reach pt or his wife to see if went to an UC. FYI Dr Damita Dunnings, Dr Darnell Level who is in office and [facility] CMA. Per access nurse note pt complied with going to UC for eval.

## 2020-02-15 NOTE — Telephone Encounter (Signed)
[facility] - Client TELEPHONE ADVICE RECORD Access[facility]urse Patient Name: [name] Gender: Male DOB: 1924-05-09 Age: 86 Y 10 M 20 D Return Phone Number: [phone] (Primary) Address: City/State/Zip: [place] [address] Client [facility] - Client Client Site [place] Physician Renford Dills - MD Contact Type Call Who Is Calling Patient / Member / Family / Caregiver Call Type Triage / Clinical Caller Name Selby Foisy Relationship To Patient Spouse Return Phone Number [phone] (Primary) Chief Complaint Wound Infection Reason for Call Symptomatic / Request for [place] office calling to have a patient triage for a bee sting. Her husband was stung all over and had a wound on his leg. 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 If symptomatic, describe symptoms. ---Caller states many bee stings 3 weeks ago. one spot on left leg blistered and broke open. cleaning and antibiotic oint. no fever Does the patient have any new or worsening symptoms? ---Yes Will a triage be completed? ---Yes Related visit to physician within the last 2 weeks? ---No Does the PT have any chronic conditions? (i.e. diabetes, asthma, this includes High risk factors for pregnancy, etc.) ---Yes List chronic conditions. ---GERD Is this a behavioral health or substance abuse call? ---No Guidelines Guideline Title Affirmed Question Affirmed Notes Nurse Date/Time (Eastern Time) Wound Infection [1] Skin around the wound has become red AND [2] larger than 2 inches (5 cm) Kathi Ludwig, RN, Tracie 02/15/2020 12:18:30 PM Disp. Time Eilene Ghazi Time) Disposition Final User 02/15/2020 12:19:35 PM See HCP within 4 Hours (or  PCP triage) Yes Kathi Ludwig, RN, Leana Roe PLEASE NOTE: All timestamps contained within this report are represented as [place] Standard Time. CONFIDENTIALTY NOTICE: This fax transmission is intended only for the addressee. It contains information that is legally privileged, confidential or otherwise protected from use or disclosure. If you are not the intended recipient, you are strictly prohibited from reviewing, disclosing, copying using or disseminating any of this information or taking any action in reliance on or regarding this information. If you have received this fax in error, please notify us immediately by telephone so that we can arrange for its return to [place]. Phone: [phone], Toll-Free: [phone], Fax: [phone] Page: 2 of 2 Call Id: [id number] [place] Disagree/Comply Comply Caller Understands Yes PreDisposition Did not know what to do Care Advice Given Per Guideline SEE HCP (OR PCP TRIAGE) WITHIN 4 HOURS: * IF OFFICE WILL BE OPEN: You need to be seen within the next 3 or 4 hours. Call your doctor (or NP/PA) now or as soon as the office opens. CALL BACK IF: * You become worse CARE ADVICE per Wound Infection (Adult) guideline. Comments User: Estevan Ryder, RN Date/Time Eilene Ghazi Time): 02/15/2020 12:26:19 PM called back line several times with no answer User: Estevan Ryder, RN Date/Time Eilene Ghazi Time): 02/15/2020 12:34:00 PM no appt available today. referred to UC Referrals GO TO FACILITY OTHER - SPECIFY

## 2020-02-15 NOTE — Telephone Encounter (Signed)
Noted. Thanks.

## 2020-02-15 NOTE — Telephone Encounter (Signed)
I spoke with [name] (DPR signed) pt just left the [facility] in [place] and pt was dx with cellullits and given an abx. And next wk if pt is not better pts wife will call to schedule FU with DR Damita Dunnings. FYI to Dr Damita Dunnings and Dr Darnell Level.

## 2020-02-15 NOTE — Telephone Encounter (Signed)
Noted. If didn't go to [facility], would offer 4pm appt in office with me.

## 2020-02-25 ENCOUNTER — Other Ambulatory Visit (INDEPENDENT_AMBULATORY_CARE_PROVIDER_SITE_OTHER): Payer: Medicare Other

## 2020-02-25 ENCOUNTER — Other Ambulatory Visit: Payer: Self-pay

## 2020-02-25 DIAGNOSIS — E785 Hyperlipidemia, unspecified: Secondary | ICD-10-CM | POA: Diagnosis not present

## 2020-02-25 LAB — CBC WITH DIFFERENTIAL/PLATELET
Basophils Absolute: 0 10*3/uL (ref 0.0–0.1)
Basophils Relative: 0.6 % (ref 0.0–3.0)
Eosinophils Absolute: 0.5 10*3/uL (ref 0.0–0.7)
Eosinophils Relative: 9.2 % — ABNORMAL HIGH (ref 0.0–5.0)
HCT: 43 % (ref 39.0–52.0)
Hemoglobin: 14.9 g/dL (ref 13.0–17.0)
Lymphocytes Relative: 35.9 % (ref 12.0–46.0)
Lymphs Abs: 2.1 10*3/uL (ref 0.7–4.0)
MCHC: 34.5 g/dL (ref 30.0–36.0)
MCV: 96.5 fl (ref 78.0–100.0)
Monocytes Absolute: 0.7 10*3/uL (ref 0.1–1.0)
Monocytes Relative: 11.9 % (ref 3.0–12.0)
Neutro Abs: 2.5 10*3/uL (ref 1.4–7.7)
Neutrophils Relative %: 42.4 % — ABNORMAL LOW (ref 43.0–77.0)
Platelets: 188 10*3/uL (ref 150.0–400.0)
RBC: 4.46 Mil/uL (ref 4.22–5.81)
RDW: 13.1 % (ref 11.5–15.5)
WBC: 5.8 10*3/uL (ref 4.0–10.5)

## 2020-02-25 LAB — COMPREHENSIVE METABOLIC PANEL
ALT: 18 U/L (ref 0–53)
AST: 25 U/L (ref 0–37)
Albumin: 4.3 g/dL (ref 3.5–5.2)
Alkaline Phosphatase: 70 U/L (ref 39–117)
BUN: 14 mg/dL (ref 6–23)
CO2: 31 mEq/L (ref 19–32)
Calcium: 9.8 mg/dL (ref 8.4–10.5)
Chloride: 101 mEq/L (ref 96–112)
Creatinine, Ser: 1.06 mg/dL (ref 0.40–1.50)
GFR: 59.81 mL/min — ABNORMAL LOW (ref >60.00)
Glucose, Bld: 100 mg/dL — ABNORMAL HIGH (ref 70–99)
Potassium: 4.6 mEq/L (ref 3.5–5.1)
Sodium: 138 mEq/L (ref 135–145)
Total Bilirubin: 1.6 mg/dL — ABNORMAL HIGH (ref 0.2–1.2)
Total Protein: 7.5 g/dL (ref 6.0–8.3)

## 2020-02-25 LAB — LIPID PANEL
Cholesterol: 181 mg/dL (ref 0–200)
HDL: 38.4 mg/dL — ABNORMAL LOW (ref >39.00)
LDL Cholesterol: 123 mg/dL — ABNORMAL HIGH (ref 0–99)
NonHDL: 142.65
Total CHOL/HDL Ratio: 5
Triglycerides: 99 mg/dL (ref 0.0–149.0)
VLDL: 19.8 mg/dL (ref 0.0–40.0)

## 2020-02-28 ENCOUNTER — Ambulatory Visit (INDEPENDENT_AMBULATORY_CARE_PROVIDER_SITE_OTHER): Payer: Medicare Other | Admitting: Family Medicine

## 2020-02-28 ENCOUNTER — Encounter: Payer: Self-pay | Admitting: Family Medicine

## 2020-02-28 ENCOUNTER — Other Ambulatory Visit: Payer: Self-pay

## 2020-02-28 VITALS — BP 160/70 | HR 58 | Temp 97.9°F | Ht 66.5 in | Wt 146.0 lb

## 2020-02-28 DIAGNOSIS — K222 Esophageal obstruction: Secondary | ICD-10-CM

## 2020-02-28 DIAGNOSIS — Z7189 Other specified counseling: Secondary | ICD-10-CM

## 2020-02-28 DIAGNOSIS — Z23 Encounter for immunization: Secondary | ICD-10-CM | POA: Diagnosis not present

## 2020-02-28 DIAGNOSIS — Z0000 Encounter for general adult medical examination without abnormal findings: Secondary | ICD-10-CM | POA: Diagnosis not present

## 2020-02-28 DIAGNOSIS — R55 Syncope and collapse: Secondary | ICD-10-CM

## 2020-02-28 NOTE — Patient Instructions (Signed)
Flu shot today.  Check with your insurance to see if they will cover the shingrix shot. Let me update Dr. Manuella Ghazi.  Take care.  Glad to see you.

## 2020-02-28 NOTE — Progress Notes (Signed)
This visit occurred during the SARS-CoV-2 public health emergency.  Safety protocols were in place, including screening questions prior to the visit, additional usage of staff PPE, and extensive cleaning of exam room while observing appropriate contact time as indicated for disinfecting solutions.  I have personally reviewed the Medicare Annual Wellness questionnaire and have noted 1. The patient's medical and social history 2. Their use of alcohol, tobacco or illicit drugs 3. Their current medications and supplements 4. The patient's functional ability including ADL's, fall risks, home safety risks and hearing or visual             impairment. 5. Diet and physical activities 6. Evidence for depression or mood disorders  The patients weight, height, BMI have been recorded in the chart and visual acuity is per eye clinic.  I have made referrals, counseling and provided education to the patient based review of the above and I have provided the pt with a written personalized care plan for preventive services.  Provider list updated- see scanned forms.  Routine anticipatory guidance given to patient.  See health maintenance. The possibility exists that previously documented standard health maintenance information may have been brought forward from a previous encounter into this note.  If needed, that same information has been updated to reflect the current situation based on today's encounter.    Flu 2021 Shingles dw pt.  Not yet done.   Discussed with patient. PNA UTD Tetanus 2019 Colon CA screening NA due to age Prostate cancer screening NA due to age Advance directive- wife designated if patient were incapacitated.   Cognitive function addressed- see scanned forms- and if abnormal then additional documentation follows.   BCC removed from L brow per derm clinic.  I will defer.  He agrees.  He ran over yellow jacket nest with his lawn mower and had mult stings on his legs.  He had local sx.   Lesion on the L lower shin likely developed cellulitis, eventually drained, healing in the meantime.  No pain now.  He stopped doing his yard work as of 1 month ago.  He hired some help for that.    H/o schatzki ring, s/p dilation.  Still on PPI at baseline. Still swallowing well.    He had injection for CTS with resolution of sx.    Episode of syncope.  Didn't go to ER.  This was about 2 months ago.  No causative meds, no sildenafil use.  He was kneeling, passed out, then came to and got up on his own.  No chest pain.  Had seen Dr. Doreene Nest with cardiology and Dr. Manuella Ghazi with neurology.  He had an episode of disorientation prev in the summer w/o syncope.    He had noted occ memory lapses.  D/w pt.  No red flag events.  He has noted some balance changes.  He fells this summer working in the yard.   ======================= Per Dr. Manuella Ghazi---- 1. Three different syncopal events since 2008, most recent event occurred on 01/2019 which was concerning for TIA early on with negative stroke etiology work up, strongly suspect possible seizure however no additional events.  -Will continue to monitor =======================  Carotid eval prev done, d/w pt.  No significant change from previous. Consider repeat in 1 year.  Summary: Right Carotid: Velocities in the right ICA are consistent with a 40-59% stenosis. Non-hemodynamically significant plaque <50% noted in the CCA.  Left Carotid: Velocities in the left ICA are consistent with a 1-39% stenosis. Non-hemodynamically  significant plaque <50% noted in the CCA.   PMH and SH reviewed  Meds, vitals, and allergies reviewed.   ROS: Per HPI.  Unless specifically indicated otherwise in HPI, the patient denies:  General: fever. Eyes: acute vision changes ENT: sore throat Cardiovascular: chest pain Respiratory: SOB GI: vomiting GU: dysuria Musculoskeletal: acute back pain Derm: acute rash Neuro: acute motor dysfunction Psych: worsening  mood Endocrine: polydipsia Heme: bleeding Allergy: hayfever  GEN: nad, alert and oriented HEENT: ncat, healing excision site near left brow. NECK: supple w/o LA CV: rrr. PULM: ctab, no inc wob ABD: soft, +bs EXT: no edema SKIN: no acute rash

## 2020-03-03 ENCOUNTER — Telehealth: Payer: Self-pay | Admitting: Family Medicine

## 2020-03-03 NOTE — Telephone Encounter (Signed)
Please send copy of office visit note to Dr. Manuella Ghazi with neurology.  He was concerned about possible seizure.  Patient has had episodes of syncope in the meantime which could be concerning for seizure and I need his input.  Thanks.

## 2020-03-03 NOTE — Assessment & Plan Note (Signed)
Advance directive- wife designated if patient were incapacitated.  

## 2020-03-03 NOTE — Assessment & Plan Note (Signed)
Flu 2021 Shingles dw pt.  Not yet done.   Discussed with patient. PNA UTD Tetanus 2019 Colon CA screening NA due to age Prostate cancer screening NA due to age Advance directive- wife designated if patient were incapacitated.   Cognitive function addressed- see scanned forms- and if abnormal then additional documentation follows.

## 2020-03-03 NOTE — Assessment & Plan Note (Signed)
H/o schatzki ring, s/p dilation.  Still on PPI at baseline. Still swallowing well.    Continue pantoprazole.

## 2020-03-03 NOTE — Assessment & Plan Note (Signed)
Episode of syncope.  Didn't go to ER.  This was about 2 months ago.  No causative meds, no sildenafil use.  He was kneeling, passed out, then came to and got up on his own.  No chest pain.  Had seen Dr. Doreene Nest with cardiology and Dr. Manuella Ghazi with neurology.  He had an episode of disorientation prev in the summer w/o syncope.    He had noted occ memory lapses.  D/w pt.  No red flag events.  He has noted some balance changes.  He fells this summer working in the yard.   ======================= Per Dr. Manuella Ghazi---- 1. Three different syncopal events since 2008, most recent event occurred on 01/2019 which was concerning for TIA early on with negative stroke etiology work up, strongly suspect possible seizure however no additional events.  -Will continue to monitor =======================  Carotid eval prev done, d/w pt.  No significant change from previous. Consider repeat in 1 year.  Summary: Right Carotid: Velocities in the right ICA are consistent with a 40-59% stenosis. Non-hemodynamically significant plaque <50% noted in the CCA.  Left Carotid: Velocities in the left ICA are consistent with a 1-39% stenosis. Non-hemodynamically significant plaque <50% noted in the CCA. ========================== I need input from neurology.  See following phone note.

## 2020-03-04 NOTE — Telephone Encounter (Signed)
OV note sent to Dr. Manuella Ghazi as requested.

## 2020-03-11 DIAGNOSIS — Z85828 Personal history of other malignant neoplasm of skin: Secondary | ICD-10-CM | POA: Insufficient documentation

## 2020-07-07 ENCOUNTER — Encounter: Payer: Self-pay | Admitting: Oncology

## 2020-07-07 ENCOUNTER — Inpatient Hospital Stay: Payer: Medicare Other | Attending: Oncology | Admitting: Oncology

## 2020-07-07 ENCOUNTER — Inpatient Hospital Stay: Payer: Medicare Other

## 2020-07-07 VITALS — BP 137/74 | HR 56 | Temp 98.0°F | Resp 18

## 2020-07-07 DIAGNOSIS — G629 Polyneuropathy, unspecified: Secondary | ICD-10-CM | POA: Insufficient documentation

## 2020-07-07 DIAGNOSIS — Z87891 Personal history of nicotine dependence: Secondary | ICD-10-CM | POA: Diagnosis not present

## 2020-07-07 DIAGNOSIS — D472 Monoclonal gammopathy: Secondary | ICD-10-CM | POA: Diagnosis present

## 2020-07-07 DIAGNOSIS — Z79899 Other long term (current) drug therapy: Secondary | ICD-10-CM | POA: Insufficient documentation

## 2020-07-07 DIAGNOSIS — E785 Hyperlipidemia, unspecified: Secondary | ICD-10-CM | POA: Diagnosis not present

## 2020-07-07 DIAGNOSIS — N400 Enlarged prostate without lower urinary tract symptoms: Secondary | ICD-10-CM | POA: Diagnosis not present

## 2020-07-07 DIAGNOSIS — I10 Essential (primary) hypertension: Secondary | ICD-10-CM | POA: Insufficient documentation

## 2020-07-07 DIAGNOSIS — Z8673 Personal history of transient ischemic attack (TIA), and cerebral infarction without residual deficits: Secondary | ICD-10-CM | POA: Diagnosis not present

## 2020-07-07 DIAGNOSIS — Z7982 Long term (current) use of aspirin: Secondary | ICD-10-CM | POA: Insufficient documentation

## 2020-07-07 DIAGNOSIS — K219 Gastro-esophageal reflux disease without esophagitis: Secondary | ICD-10-CM | POA: Diagnosis not present

## 2020-07-07 LAB — CBC WITH DIFFERENTIAL/PLATELET
Abs Immature Granulocytes: 0.01 10*3/uL (ref 0.00–0.07)
Basophils Absolute: 0 10*3/uL (ref 0.0–0.1)
Basophils Relative: 1 %
Eosinophils Absolute: 0.2 10*3/uL (ref 0.0–0.5)
Eosinophils Relative: 3 %
HCT: 43.8 % (ref 39.0–52.0)
Hemoglobin: 15.2 g/dL (ref 13.0–17.0)
Immature Granulocytes: 0 %
Lymphocytes Relative: 35 %
Lymphs Abs: 2 10*3/uL (ref 0.7–4.0)
MCH: 32.3 pg (ref 26.0–34.0)
MCHC: 34.7 g/dL (ref 30.0–36.0)
MCV: 93.2 fL (ref 80.0–100.0)
Monocytes Absolute: 0.6 10*3/uL (ref 0.1–1.0)
Monocytes Relative: 9 %
Neutro Abs: 3 10*3/uL (ref 1.7–7.7)
Neutrophils Relative %: 52 %
Platelets: 151 10*3/uL (ref 150–400)
RBC: 4.7 MIL/uL (ref 4.22–5.81)
RDW: 12.3 % (ref 11.5–15.5)
WBC: 5.9 10*3/uL (ref 4.0–10.5)
nRBC: 0 % (ref 0.0–0.2)

## 2020-07-07 LAB — COMPREHENSIVE METABOLIC PANEL
ALT: 20 U/L (ref 0–44)
AST: 26 U/L (ref 15–41)
Albumin: 4.7 g/dL (ref 3.5–5.0)
Alkaline Phosphatase: 58 U/L (ref 38–126)
Anion gap: 11 (ref 5–15)
BUN: 16 mg/dL (ref 8–23)
CO2: 27 mmol/L (ref 22–32)
Calcium: 9.3 mg/dL (ref 8.9–10.3)
Chloride: 101 mmol/L (ref 98–111)
Creatinine, Ser: 1 mg/dL (ref 0.61–1.24)
GFR, Estimated: >60 mL/min (ref >60)
Glucose, Bld: 108 mg/dL — ABNORMAL HIGH (ref 70–99)
Potassium: 4.2 mmol/L (ref 3.5–5.1)
Sodium: 139 mmol/L (ref 135–145)
Total Bilirubin: 2.6 mg/dL — ABNORMAL HIGH (ref 0.3–1.2)
Total Protein: 7.7 g/dL (ref 6.5–8.1)

## 2020-07-07 NOTE — Progress Notes (Signed)
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   HISTORY OF PRESENTING ILLNESS:   [name] is a  87 y.o.  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   Patient is accompanied by her daughter. 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  He sees Neurology Dr.Shah for neuropathy. EMG confirmed carpal tunnel manifesting as tingling and numbness in hands bilaterally with atrophy of APB in left hand.  As part of the work up, SPEP showed M spike of 0.3, IFE showed IgA lamda monoclonal protein. Patient was referred to hematology for further evaluation.   Denies any previous cardiovascular disease, kidney disease.   Review of Systems  Constitutional: Negative for appetite change, chills, fatigue, fever and unexpected weight change.  HENT:   Negative for hearing loss and voice change.   Eyes: Negative for eye problems and icterus.  Respiratory: Negative for chest tightness, cough and shortness of breath.   Cardiovascular: Negative for chest pain and leg swelling.  Gastrointestinal: Negative for abdominal distention and abdominal pain.  Endocrine: Negative for hot flashes.  Genitourinary: Negative for difficulty urinating, dysuria and frequency.   Musculoskeletal: Negative for arthralgias.  Skin: Negative for itching and rash.  Neurological: Positive for numbness. Negative for light-headedness.  Hematological: Negative for adenopathy. Does not bruise/bleed easily.  Psychiatric/Behavioral: Negative for  confusion.    MEDICAL HISTORY:  Past Medical History:  Diagnosis Date  . BPH (benign prostatic hyperplasia) 06/1997  . GERD (gastroesophageal reflux disease)    schatzki ring dilated, [facility]. [facility]. (Dr. Uvaldo Rising)  . Hyperlipemia 1990's  . Hypertension 02/2004  . TIA (transient ischemic attack)    was not on aspirin at time of TIA per patient    SURGICAL HISTORY: Past Surgical History:  Procedure Laterality Date  . APPENDECTOMY  1947  . bilateral hernia repair  1965  . CHOLECYSTECTOMY  07/2003  . DG CHEST FOR [employer] EMPLOYEE  06/02-08/2006   TIA, Dyslipidemia, HTN, Right Sided Carotid Artery Stenosis 40-60%  . ESOPHAGOGASTRODUODENOSCOPY  03/23/2001   schatzki ring dilated, H. H. (Dr. Uvaldo Rising)  . ESOPHAGOGASTRODUODENOSCOPY  06/13/2012   Procedure: ESOPHAGOGASTRODUODENOSCOPY (EGD);  Surgeon: Jerene Bears, MD;  Location: [place];  Service: Gastroenterology;  Laterality: N/A;  . holter monitor  10/17/2001   ISOL, PVC's, SVC's  . MRI Brain     nml, MRA brain, intracran athersc dz MCA RPCA 10/18/06, Head CT NML 10/17/06  . MRI Cerv Spine  10/18/2006   mild degen changes    SOCIAL HISTORY: Social History   Socioeconomic History  . Marital status: Married    Spouse name: Not on file  . Number of children: Not on file  . Years of education: Not on file  . Highest education level: Not on file  Occupational History  . 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 Smoking status: Former Smoker    Types: Cigarettes    Quit date: 05/17/1970    Years since quitting: 50.1  . Smokeless tobacco: Never Used  .  Tobacco comment: quit 1972  Vaping Use  . Vaping Use: Never used  Substance and Sexual Activity  . Alcohol use: No  . Drug use: No  . Sexual activity: Yes  Other Topics Concern  . 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 Stroke Mother   . Diabetes Sister   . Heart disease Sister        CAD, AFib, GI bleed, FE deficiency  . Prostate cancer Brother   . Cancer Brother 55       lungs, smoker  . Colon cancer Neg Hx     ALLERGIES:  is allergic to atorvastatin, ezetimibe-simvastatin, sildenafil, and simvastatin.  MEDICATIONS:  Current Outpatient Medications  Medication Sig Dispense Refill  . aspirin 81 MG tablet Take 81 mg by mouth daily.    . Cyanocobalamin (VITAMIN B12) 1000 MCG TBCR Take 1 tablet by mouth daily.    . pantoprazole (PROTONIX) 40 MG tablet TAKE ONE TABLET EVERY DAY 90 tablet 3  . VITAMIN D PO Take 1 tablet by mouth daily.     No current facility-administered medications for this visit.     PHYSICAL EXAMINATION: ECOG PERFORMANCE STATUS: 1 - Symptomatic but completely ambulatory Vitals:   07/07/20 1515  BP: 137/74  Pulse: (!) 56  Resp: 18  Temp: 98 F (36.7 C)   There were no vitals filed for this visit.  Physical Exam Constitutional:      General: He is not in acute distress.    Comments: Patient walks independantly  HENT:     Head: Normocephalic and atraumatic.  Eyes:     General: No scleral icterus. Cardiovascular:     Rate and Rhythm: Normal rate and regular rhythm.     Heart sounds: Normal heart sounds.  Pulmonary:     Effort: Pulmonary effort is normal. No respiratory distress.     Breath sounds: No wheezing.  Abdominal:     General: Bowel sounds are normal. There is no distension.     Palpations: Abdomen is soft.  Musculoskeletal:        General: No deformity. Normal  range of motion.     Cervical back: Normal range of motion and neck supple.  Skin:    General: Skin is warm and dry.     Findings: No erythema or rash.  Neurological:     Mental Status: He is alert and oriented to person, place, and time. Mental status is at baseline.     Cranial Nerves: No cranial nerve deficit.     Coordination: Coordination normal.  Psychiatric:        Mood and Affect: Mood normal.     LABORATORY DATA:  I have reviewed the data as listed Lab Results  Component Value Date   WBC 5.9 07/07/2020   HGB 15.2 07/07/2020   HCT 43.8 07/07/2020   MCV 93.2 07/07/2020   PLT 151 07/07/2020   Recent Labs  02/25/20 0839 07/07/20 1602  NA 138 139  K 4.6 4.2  CL 101 101  CO2 31 27  GLUCOSE 100* 108*  BUN 14 16  CREATININE 1.06 1.00  CALCIUM 9.8 9.3  GFRNONAA  --  >60  PROT 7.5 7.7  ALBUMIN 4.3 4.7  AST 25 26  ALT 18 20  ALKPHOS 70 58  BILITOT 1.6* 2.6*   Iron/TIBC/Ferritin/ %Sat    Component Value Date/Time   IRON 91 06/23/2012 1027   FERRITIN 71.4 06/23/2012 1027   IRONPCTSAT 30.0 06/23/2012 1027      RADIOGRAPHIC STUDIES: I have personally reviewed the radiological images as listed and agreed with the findings in the report. No results found.    ASSESSMENT & PLAN:  1. MGUS (monoclonal gammopathy of unknown significance)   2. Neuropathy    I discussed with patient about the diagnosis of IgA MGUS which is an asymptomatic condition which has a small risk of progression to smoldering multiple myeloma and to symptomatic multiple myeloma. Less frequently, these patients progress to AL amyloidosis, light chain deposition disease, or another lymphoproliferative disorder. For now I recommend observation. Check cbc cmp, UPEP, beta2 microglobulin, multiple myeloma panel, NT BNP, and urine random protein/creatinine.   Neuropathy. May or may not be related to MGUS. Pending above work up.  Orders Placed This Encounter  Procedures  . Kappa/lambda light  chains    Standing Status:   Future    Number of Occurrences:   1    Standing Expiration Date:   07/07/2021  . Multiple Myeloma Panel (SPEP&IFE w/QIG)    Standing Status:   Future    Number of Occurrences:   1    Standing Expiration Date:   07/07/2021  . Beta 2 microglobulin, serum    Standing Status:   Future    Number of Occurrences:   1    Standing Expiration Date:   07/07/2021  . Protein Electro, Random Urine    Standing Status:   Future    Number of Occurrences:   1    Standing Expiration Date:   07/07/2021  . CBC with Differential/Platelet    Standing Status:   Future    Number of Occurrences:   1    Standing Expiration Date:   07/07/2021  . Comprehensive metabolic panel    Standing Status:   Future    Number of Occurrences:   1    Standing Expiration Date:   07/07/2021  . Miscellaneous [facility] test (send-out)    Standing Status:   Future    Number of Occurrences:   1    Standing Expiration Date:   07/07/2021    Order Specific Question:   Test name / description:    Answer:   NT-proBNP [facility] [id number]  . Miscellaneous [facility] test (send-out)    Standing Status:   Future    Number of Occurrences:   1    Standing Expiration Date:   07/07/2021    Order Specific Question:   Test name / description:    Answer:   Protein and Creatinine, Random Urine [facility] [id number]    All questions were answered. The patient knows to call the clinic with any problems questions or concerns.  cc Vladimir Crofts, MD    Return of visit: 2 weeks.  Thank you for this kind referral and the opportunity to participate in the care of this patient. A copy of today's note is routed to referring provider    Earlie Server,  MD, PhD Hematology Oncology [facility] at [facility] Pager- [phone] 07/07/2020

## 2020-07-07 NOTE — Progress Notes (Signed)
Patient here to establish care for monoclonal gammopathy.

## 2020-07-08 LAB — MISC LABCORP TEST (SEND OUT)
Labcorp test code: 143000
Labcorp test code: 3129

## 2020-07-08 LAB — MULTIPLE MYELOMA PANEL, SERUM
Albumin SerPl Elph-Mcnc: 4.1 g/dL (ref 2.9–4.4)
Albumin/Glob SerPl: 1.4 (ref 0.7–1.7)
Alpha 1: 0.2 g/dL (ref 0.0–0.4)
Alpha2 Glob SerPl Elph-Mcnc: 0.8 g/dL (ref 0.4–1.0)
B-Globulin SerPl Elph-Mcnc: 1 g/dL (ref 0.7–1.3)
Gamma Glob SerPl Elph-Mcnc: 1.1 g/dL (ref 0.4–1.8)
Globulin, Total: 3 g/dL (ref 2.2–3.9)
IgA: 358 mg/dL (ref 61–437)
IgG (Immunoglobin G), Serum: 913 mg/dL (ref 603–1613)
IgM (Immunoglobulin M), Srm: 48 mg/dL (ref 15–143)
M Protein SerPl Elph-Mcnc: 0.4 g/dL — ABNORMAL HIGH
Total Protein ELP: 7.1 g/dL (ref 6.0–8.5)

## 2020-07-08 LAB — BETA 2 MICROGLOBULIN, SERUM: Beta-2 Microglobulin: 1.6 mg/L (ref 0.6–2.4)

## 2020-07-08 LAB — KAPPA/LAMBDA LIGHT CHAINS
Kappa free light chain: 15.5 mg/L (ref 3.3–19.4)
Kappa, lambda light chain ratio: 0.17 — ABNORMAL LOW (ref 0.26–1.65)
Lambda free light chains: 88.6 mg/L — ABNORMAL HIGH (ref 5.7–26.3)

## 2020-07-10 LAB — PROTEIN ELECTRO, RANDOM URINE
Albumin ELP, Urine: 100 %
Alpha-1-Globulin, U: 0 %
Alpha-2-Globulin, U: 0 %
Beta Globulin, U: 0 %
Gamma Globulin, U: 0 %
Total Protein, Urine: 5.7 mg/dL

## 2020-07-23 ENCOUNTER — Encounter: Payer: Self-pay | Admitting: Oncology

## 2020-07-23 ENCOUNTER — Inpatient Hospital Stay: Payer: Medicare Other | Attending: Oncology | Admitting: Oncology

## 2020-07-23 VITALS — BP 165/73 | HR 55 | Temp 96.9°F | Resp 16 | Wt 147.6 lb

## 2020-07-23 DIAGNOSIS — Z87891 Personal history of nicotine dependence: Secondary | ICD-10-CM | POA: Insufficient documentation

## 2020-07-23 DIAGNOSIS — Z79899 Other long term (current) drug therapy: Secondary | ICD-10-CM | POA: Insufficient documentation

## 2020-07-23 DIAGNOSIS — K219 Gastro-esophageal reflux disease without esophagitis: Secondary | ICD-10-CM | POA: Insufficient documentation

## 2020-07-23 DIAGNOSIS — I10 Essential (primary) hypertension: Secondary | ICD-10-CM | POA: Diagnosis not present

## 2020-07-23 DIAGNOSIS — E785 Hyperlipidemia, unspecified: Secondary | ICD-10-CM | POA: Insufficient documentation

## 2020-07-23 DIAGNOSIS — D472 Monoclonal gammopathy: Secondary | ICD-10-CM | POA: Insufficient documentation

## 2020-07-23 DIAGNOSIS — N400 Enlarged prostate without lower urinary tract symptoms: Secondary | ICD-10-CM | POA: Diagnosis not present

## 2020-07-23 DIAGNOSIS — Z8673 Personal history of transient ischemic attack (TIA), and cerebral infarction without residual deficits: Secondary | ICD-10-CM | POA: Insufficient documentation

## 2020-07-23 DIAGNOSIS — G629 Polyneuropathy, unspecified: Secondary | ICD-10-CM | POA: Diagnosis not present

## 2020-07-23 DIAGNOSIS — Z7982 Long term (current) use of aspirin: Secondary | ICD-10-CM | POA: Insufficient documentation

## 2020-07-23 NOTE — Progress Notes (Signed)
Patient denies new problems/concerns today.   °

## 2020-07-23 NOTE — Progress Notes (Signed)
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   HISTORY OF PRESENTING ILLNESS:   [name] is a  87 y.o.  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   Patient is accompanied by her daughter. 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  He sees Neurology Dr.Shah for neuropathy. EMG confirmed carpal tunnel manifesting as tingling and numbness in hands bilaterally with atrophy of APB in left hand.  As part of the work up, SPEP showed M spike of 0.3, IFE showed IgA lamda monoclonal protein. Patient was referred to hematology for further evaluation.   Denies any previous cardiovascular disease, kidney disease.   INTERVAL HISTORY [name] is a 87 y.o. male who has above history reviewed by me today presents for follow up visit for MGUS Problems and complaints are listed below: Patient had blood work done since last visit.  Presents to discuss results.  Accompanied by daughter.  No new complaints.  Chronic neuropathy.  Review of Systems  Constitutional: Negative for appetite change, chills, fatigue, fever and unexpected weight change.  HENT:   Negative for hearing loss and voice change.   Eyes: Negative for eye problems and icterus.  Respiratory: Negative for chest tightness, cough and shortness of breath.   Cardiovascular: Negative for chest pain and leg swelling.  Gastrointestinal: Negative for abdominal distention and abdominal pain.  Endocrine: Negative for hot flashes.  Genitourinary:  Negative for difficulty urinating, dysuria and frequency.   Musculoskeletal: Negative for arthralgias.  Skin: Negative for itching and rash.  Neurological: Positive for numbness. Negative for light-headedness.  Hematological: Negative for adenopathy. Does not bruise/bleed easily.  Psychiatric/Behavioral: Negative for confusion.    MEDICAL HISTORY:  Past Medical History:  Diagnosis Date  . BPH (benign prostatic hyperplasia) 06/1997  . GERD (gastroesophageal reflux disease)    schatzki ring dilated, H. H. (Dr. Uvaldo Rising)  . Hyperlipemia 1990's  . Hypertension 02/2004  . TIA (transient ischemic attack)    was not on aspirin at time of TIA per patient    SURGICAL HISTORY: Past Surgical History:  Procedure Laterality Date  . APPENDECTOMY  1947  . bilateral hernia repair  1965  . CHOLECYSTECTOMY  07/2003  . DG CHEST FOR [employer] EMPLOYEE  06/02-08/2006   TIA, Dyslipidemia, HTN, Right Sided Carotid Artery Stenosis 40-60%  . ESOPHAGOGASTRODUODENOSCOPY  03/23/2001   schatzki ring dilated, H. H. (Dr. Uvaldo Rising)  . ESOPHAGOGASTRODUODENOSCOPY  06/13/2012   Procedure: ESOPHAGOGASTRODUODENOSCOPY (EGD);  Surgeon: Jerene Bears, MD;  Location: [place];  Service: Gastroenterology;  Laterality: N/A;  . holter monitor  10/17/2001   ISOL, PVC's, SVC's  . MRI Brain     nml, MRA brain, intracran athersc dz MCA RPCA 10/18/06, Head CT NML 10/17/06  . MRI Cerv Spine  10/18/2006   mild degen changes    SOCIAL HISTORY: Social History   Socioeconomic History  . Marital status: Married    Spouse name: Not on file  . Number of children: Not on file  . Years of education: Not on file  . Highest education level: Not on  file  Occupational History  . 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 Smoking status: Former Smoker    Types: Cigarettes    Quit date: 05/17/1970    Years since quitting: 50.2  . Smokeless tobacco: Never Used  . Tobacco  comment: quit 1972  Vaping Use  . Vaping Use: Never used  Substance and Sexual Activity  . Alcohol use: No  . Drug use: No  . Sexual activity: Yes  Other Topics Concern  . 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 Stroke Mother   . Diabetes Sister   . Heart disease Sister        CAD, AFib, GI bleed, FE deficiency  . Prostate cancer Brother   . Cancer Brother 34       lungs, smoker  . Colon cancer Neg Hx     ALLERGIES:  is allergic to atorvastatin, ezetimibe-simvastatin, sildenafil, and simvastatin.  MEDICATIONS:  Current Outpatient Medications  Medication Sig Dispense Refill  . aspirin 81 MG tablet Take 81 mg by mouth daily.    . Cyanocobalamin (VITAMIN B12) 1000 MCG TBCR Take 1 tablet by mouth daily.    . pantoprazole (PROTONIX) 40 MG tablet TAKE ONE TABLET EVERY DAY 90 tablet 3  . VITAMIN D PO Take 1 tablet by mouth daily.     No current facility-administered medications for this visit.     PHYSICAL EXAMINATION: ECOG PERFORMANCE STATUS: 1 - Symptomatic but completely ambulatory Vitals:   07/23/20 1402  BP: (!) 165/73  Pulse: (!) 55  Resp: 16  Temp: (!) 96.9 F (36.1 C)   Filed Weights   07/23/20 1402  Weight: 147 lb 9.6 oz (67 kg)    Physical Exam Constitutional:      General: He is not in acute distress.    Comments: Patient walks independantly  HENT:     Head: Normocephalic and atraumatic.  Eyes:     General: No scleral icterus. Cardiovascular:     Rate and  Rhythm: Normal rate and regular rhythm.     Heart sounds: Normal heart sounds.  Pulmonary:     Effort: Pulmonary effort is normal. No respiratory distress.     Breath sounds: No wheezing.  Abdominal:     General: Bowel sounds are normal. There is no distension.     Palpations: Abdomen is soft.  Musculoskeletal:        General: No deformity. Normal range of motion.     Cervical back: Normal range of motion and neck supple.  Skin:    General: Skin is warm and dry.     Findings: No erythema or rash.  Neurological:     Mental Status: He is alert and oriented to person, place, and time. Mental status is at baseline.     Cranial Nerves: No cranial nerve deficit.     Coordination:  Coordination normal.  Psychiatric:        Mood and Affect: Mood normal.     LABORATORY DATA:  I have reviewed the data as listed Lab Results  Component Value Date   WBC 5.9 07/07/2020   HGB 15.2 07/07/2020   HCT 43.8 07/07/2020   MCV 93.2 07/07/2020   PLT 151 07/07/2020   Recent Labs    02/25/20 0839 07/07/20 1602  NA 138 139  K 4.6 4.2  CL 101 101  CO2 31 27  GLUCOSE 100* 108*  BUN 14 16  CREATININE 1.06 1.00  CALCIUM 9.8 9.3  GFRNONAA  --  >60  PROT 7.5 7.7  ALBUMIN 4.3 4.7  AST 25 26  ALT 18 20  ALKPHOS 70 58  BILITOT 1.6* 2.6*   Iron/TIBC/Ferritin/ %Sat    Component Value Date/Time   IRON 91 06/23/2012 1027   FERRITIN 71.4 06/23/2012 1027   IRONPCTSAT 30.0 06/23/2012 1027      RADIOGRAPHIC STUDIES: I have personally reviewed the radiological images as listed and agreed with the findings in the report. No results found.    ASSESSMENT & PLAN:  1. MGUS (monoclonal gammopathy of unknown significance)    IgA lambda MGUS NT proBNP is normal Protein/creatinine level is also normal. Beta-2 microglobulin normal. Free light chain ratio lambda/kappa 5 Recommend continue to monitor every 6 months.  Orders Placed This Encounter  Procedures  . CBC with Differential/Platelet     Standing Status:   Future    Standing Expiration Date:   07/23/2021  . Basic metabolic panel    Standing Status:   Future    Standing Expiration Date:   07/23/2021  . Hepatic function panel    Standing Status:   Future    Standing Expiration Date:   07/23/2021  . Multiple Myeloma Panel (SPEP&IFE w/QIG)    Standing Status:   Future    Standing Expiration Date:   07/23/2021  . Kappa/lambda light chains    Standing Status:   Future    Standing Expiration Date:   07/23/2021    All questions were answered. The patient knows to call the clinic with any problems questions or concerns.  cc Tonia Ghent, MD    Return of visit:  6 months  Thank you for this kind referral and the opportunity to participate in the care of this patient. A copy of today's note is routed to referring provider    Earlie Server, MD, PhD Hematology Oncology [facility] Pager- [phone] 07/23/2020

## 2020-10-22 ENCOUNTER — Inpatient Hospital Stay: Payer: Medicare Other | Attending: Oncology

## 2020-10-22 ENCOUNTER — Other Ambulatory Visit: Payer: Self-pay

## 2020-10-29 ENCOUNTER — Inpatient Hospital Stay: Payer: Medicare Other | Admitting: Oncology

## 2020-12-22 ENCOUNTER — Other Ambulatory Visit: Payer: Self-pay | Admitting: Family Medicine

## 2021-02-03 ENCOUNTER — Ambulatory Visit: Payer: Medicare Other | Admitting: Family Medicine

## 2021-02-03 ENCOUNTER — Encounter: Payer: Self-pay | Admitting: Family Medicine

## 2021-02-03 ENCOUNTER — Other Ambulatory Visit: Payer: Self-pay

## 2021-02-03 VITALS — BP 138/78 | HR 65 | Temp 97.9°F | Ht 67.0 in | Wt 144.0 lb

## 2021-02-03 DIAGNOSIS — D472 Monoclonal gammopathy: Secondary | ICD-10-CM | POA: Diagnosis not present

## 2021-02-03 DIAGNOSIS — H539 Unspecified visual disturbance: Secondary | ICD-10-CM

## 2021-02-03 DIAGNOSIS — E785 Hyperlipidemia, unspecified: Secondary | ICD-10-CM | POA: Diagnosis not present

## 2021-02-03 DIAGNOSIS — Z23 Encounter for immunization: Secondary | ICD-10-CM

## 2021-02-03 DIAGNOSIS — R413 Other amnesia: Secondary | ICD-10-CM | POA: Diagnosis not present

## 2021-02-03 NOTE — Progress Notes (Signed)
This visit occurred during the SARS-CoV-2 public health emergency.  Safety protocols were in place, including screening questions prior to the visit, additional usage of staff PPE, and extensive cleaning of exam room while observing appropriate contact time as indicated for disinfecting solutions.  He noted memory changes and balance changes.  Gradual onset with dec in stamina with walking.  H/o neuropathy and balance changes prev noted.  He had MGUS with f/u pending at hematology. He quit driving in the meantime (discussed with patient today and advised not to drive in the meantime and he agreed).  No falls recently.  He had fallen in years past on a slope, on uneven ground.    Memory is "not very good" per patient report.  Some slowing of recall.  He had noted short term changes.  Hasn't left the stove on or had any red flag events.  He thought this was a gradual change.    He has been seeing a chiropractor about his back to improve posture and strength.    I will check about collecting his lipid panel when he has all the labs done, given his history of hyperlipidemia.  He is noted some eye watering in the meantime and he is going to follow-up with the eye clinic.  He is also noted occasional changes in vision, not where he has vision loss but he will occasionally notice geometric patterns on the floor especially when he moves his field of vision from a well lit area to a shaded area.  He does not have vision loss/cut or fugax.  Meds, vitals, and allergies reviewed.   ROS: Per HPI unless specifically indicated in ROS section   GEN: nad, alert and oriented, pleasant in conversation. HEENT: ncat NECK: supple w/o LA CV: rrr. PULM: ctab, no inc wob ABD: soft, +bs EXT: no edema SKIN: Well-perfused. MMSE 29/30, -1 for copying pentagons.  40 minutes were devoted to patient care in this encounter (this includes time spent reviewing the patient's file/history, interviewing and examining the  patient, counseling/reviewing plan with patient).

## 2021-02-03 NOTE — Patient Instructions (Signed)
I think it makes sense to see the eye clinic.  Let me know if you need help getting an appointment.   I also think it makes sense to see the blood clinic tomorrow to labs done about MGUS and memory/balance.  It is possible that all of this could affect your balance and/or memory.    I will update the blood clinic.  I think it makes sense not to drive in the meantime.    Take care.  Glad to see you.

## 2021-02-04 ENCOUNTER — Encounter: Payer: Self-pay | Admitting: Oncology

## 2021-02-04 ENCOUNTER — Inpatient Hospital Stay: Payer: Medicare Other | Attending: Oncology | Admitting: Oncology

## 2021-02-04 ENCOUNTER — Other Ambulatory Visit: Payer: Self-pay

## 2021-02-04 ENCOUNTER — Inpatient Hospital Stay: Payer: Medicare Other

## 2021-02-04 ENCOUNTER — Telehealth: Payer: Self-pay | Admitting: Family Medicine

## 2021-02-04 VITALS — BP 177/76 | HR 63 | Temp 98.1°F | Resp 18 | Wt 141.0 lb

## 2021-02-04 DIAGNOSIS — E785 Hyperlipidemia, unspecified: Secondary | ICD-10-CM | POA: Diagnosis not present

## 2021-02-04 DIAGNOSIS — Z8673 Personal history of transient ischemic attack (TIA), and cerebral infarction without residual deficits: Secondary | ICD-10-CM | POA: Diagnosis not present

## 2021-02-04 DIAGNOSIS — Z79899 Other long term (current) drug therapy: Secondary | ICD-10-CM | POA: Insufficient documentation

## 2021-02-04 DIAGNOSIS — H539 Unspecified visual disturbance: Secondary | ICD-10-CM | POA: Insufficient documentation

## 2021-02-04 DIAGNOSIS — Z7982 Long term (current) use of aspirin: Secondary | ICD-10-CM | POA: Insufficient documentation

## 2021-02-04 DIAGNOSIS — K219 Gastro-esophageal reflux disease without esophagitis: Secondary | ICD-10-CM | POA: Diagnosis not present

## 2021-02-04 DIAGNOSIS — F0390 Unspecified dementia without behavioral disturbance: Secondary | ICD-10-CM | POA: Insufficient documentation

## 2021-02-04 DIAGNOSIS — D472 Monoclonal gammopathy: Secondary | ICD-10-CM

## 2021-02-04 DIAGNOSIS — R413 Other amnesia: Secondary | ICD-10-CM | POA: Insufficient documentation

## 2021-02-04 DIAGNOSIS — I10 Essential (primary) hypertension: Secondary | ICD-10-CM | POA: Insufficient documentation

## 2021-02-04 DIAGNOSIS — G629 Polyneuropathy, unspecified: Secondary | ICD-10-CM | POA: Diagnosis not present

## 2021-02-04 LAB — CBC WITH DIFFERENTIAL/PLATELET
Abs Immature Granulocytes: 0.01 10*3/uL (ref 0.00–0.07)
Basophils Absolute: 0 10*3/uL (ref 0.0–0.1)
Basophils Relative: 0 %
Eosinophils Absolute: 0.2 10*3/uL (ref 0.0–0.5)
Eosinophils Relative: 3 %
HCT: 41.8 % (ref 39.0–52.0)
Hemoglobin: 14.3 g/dL (ref 13.0–17.0)
Immature Granulocytes: 0 %
Lymphocytes Relative: 35 %
Lymphs Abs: 1.9 10*3/uL (ref 0.7–4.0)
MCH: 32.4 pg (ref 26.0–34.0)
MCHC: 34.2 g/dL (ref 30.0–36.0)
MCV: 94.6 fL (ref 80.0–100.0)
Monocytes Absolute: 0.6 10*3/uL (ref 0.1–1.0)
Monocytes Relative: 12 %
Neutro Abs: 2.6 10*3/uL (ref 1.7–7.7)
Neutrophils Relative %: 50 %
Platelets: 216 10*3/uL (ref 150–400)
RBC: 4.42 MIL/uL (ref 4.22–5.81)
RDW: 11.9 % (ref 11.5–15.5)
WBC: 5.2 10*3/uL (ref 4.0–10.5)
nRBC: 0 % (ref 0.0–0.2)

## 2021-02-04 LAB — LIPID PANEL
Cholesterol: 176 mg/dL (ref 0–200)
HDL: 36 mg/dL — ABNORMAL LOW (ref >40)
LDL Cholesterol: 123 mg/dL — ABNORMAL HIGH (ref 0–99)
Total CHOL/HDL Ratio: 4.9 RATIO
Triglycerides: 87 mg/dL (ref <150)
VLDL: 17 mg/dL (ref 0–40)

## 2021-02-04 LAB — COMPREHENSIVE METABOLIC PANEL
ALT: 22 U/L (ref 0–44)
AST: 29 U/L (ref 15–41)
Albumin: 4.2 g/dL (ref 3.5–5.0)
Alkaline Phosphatase: 72 U/L (ref 38–126)
Anion gap: 9 (ref 5–15)
BUN: 13 mg/dL (ref 8–23)
CO2: 26 mmol/L (ref 22–32)
Calcium: 9.2 mg/dL (ref 8.9–10.3)
Chloride: 101 mmol/L (ref 98–111)
Creatinine, Ser: 0.94 mg/dL (ref 0.61–1.24)
GFR, Estimated: >60 mL/min (ref >60)
Glucose, Bld: 114 mg/dL — ABNORMAL HIGH (ref 70–99)
Potassium: 4.3 mmol/L (ref 3.5–5.1)
Sodium: 136 mmol/L (ref 135–145)
Total Bilirubin: 1.5 mg/dL — ABNORMAL HIGH (ref 0.3–1.2)
Total Protein: 7.7 g/dL (ref 6.5–8.1)

## 2021-02-04 LAB — TSH: TSH: 1.685 u[IU]/mL (ref 0.350–4.500)

## 2021-02-04 LAB — VITAMIN B12: Vitamin B-12: 1188 pg/mL — ABNORMAL HIGH (ref 180–914)

## 2021-02-04 NOTE — Progress Notes (Signed)
Patient here for oncology follow-up appointment, concerns of urinary frequency  

## 2021-02-04 NOTE — Assessment & Plan Note (Signed)
See MGUS discussion.  Unclear if memory changes are related to gait changes.  I suspect that they are separate issues.  Need to exclude treatable issues, see orders.  Still okay for outpatient follow-up.  He agreed not to drive in the meantime.

## 2021-02-04 NOTE — Telephone Encounter (Signed)
I saw this patient in clinic yesterday.  I was hoping we can get all of his labs collected at the office visit with you today so that he would only have one lab draw.  I will put in future orders.  I would very much appreciate it if your lab/staff were able to collect my future orders concurrently.  I very much appreciate your help.  This patient has noted some memory changes and we needed to work that up for reversible causes.  He continues to deal with neuropathy at baseline.  Thank you again.

## 2021-02-04 NOTE — Progress Notes (Signed)
Hematology/Oncology progress note [facility] [phone] [phone]   Patient Care Team: Duncan, Graham S, MD as PCP - General (Family Medicine) Fath, Kenneth A, MD as Consulting Physician (Cardiology) Shah, Aashit K, MD as Referring Physician (Neurology)  REFERRING PROVIDER: Duncan, Graham S, MD  CHIEF COMPLAINTS/REASON FOR VISIT:  Follow up for MGUS  HISTORY OF PRESENTING ILLNESS:   [name] is a  87 y.o.  male with PMH listed below was seen in consultation at the request of  Duncan, Graham S, MD  for evaluation of monoclonal gammoathy.   Patient is accompanied by her daughter. 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  He sees Neurology Dr.Shah for neuropathy. EMG confirmed carpal tunnel manifesting as tingling and numbness in hands bilaterally with atrophy of APB in left hand.  As part of the work up, SPEP showed M spike of 0.3, IFE showed IgA lamda monoclonal protein. Patient was referred to hematology for further evaluation.   Denies any previous cardiovascular disease, kidney disease.   INTERVAL HISTORY [name] is a 87 y.o. male who has above history reviewed by me today presents for follow up visit for MGUS He is accompanied by his wife.  No new complaints.   Review of Systems  Constitutional:  Negative for appetite change, chills, fatigue, fever and unexpected weight change.  HENT:   Negative for hearing loss and voice change.   Eyes:  Negative for eye problems and icterus.  Respiratory:  Negative for chest tightness, cough and shortness of breath.   Cardiovascular:  Negative for chest pain and leg swelling.  Gastrointestinal:  Negative for abdominal distention and abdominal pain.  Endocrine: Negative for hot flashes.  Genitourinary:  Negative for difficulty urinating, dysuria and frequency.   Musculoskeletal:  Negative for arthralgias.  Skin:  Negative for itching and rash.   Neurological:  Positive for numbness. Negative for light-headedness.  Hematological:  Negative for adenopathy. Does not bruise/bleed easily.  Psychiatric/Behavioral:  Negative for confusion.    MEDICAL HISTORY:  Past Medical History:  Diagnosis Date   BPH (benign prostatic hyperplasia) 06/1997   GERD (gastroesophageal reflux disease)    schatzki ring dilated, [facility] (Dr. Kupur)   Hyperlipemia 1990's   Hypertension 02/2004   TIA (transient ischemic attack)    was not on aspirin at time of TIA per patient    SURGICAL HISTORY: Past Surgical History:  Procedure Laterality Date   APPENDECTOMY  1947   bilateral hernia repair  1965   CHOLECYSTECTOMY  07/2003   DG CHEST FOR [employer] EMPLOYEE  06/02-08/2006   TIA, Dyslipidemia, HTN, Right Sided Carotid Artery Stenosis 40-60%   ESOPHAGOGASTRODUODENOSCOPY  03/23/2001   schatzki ring dilated, [facility] (Dr. Kupur)   ESOPHAGOGASTRODUODENOSCOPY  06/13/2012   Procedure: ESOPHAGOGASTRODUODENOSCOPY (EGD);  Surgeon: Jay M Pyrtle, MD;  Location: [facility] END[facility];  Service: Gastroenterology;  Laterality: N/A;   holter monitor  10/17/2001   ISOL, PVC's, SVC's   MRI Brain     nml, MRA brain, intracran athersc dz MCA RPCA 10/18/06, Head CT NML 10/17/06   MRI Cerv Spine  10/18/2006   mild degen changes    SOCIAL HISTORY: Social History   Socioeconomic History   Marital status: Married    Spouse name: Not on file   Number of children: Not on file   Years of education: Not on file   Highest education level: Not on file  Occupational History   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    Employer: RETIRED  Tobacco Use   Smoking status: Former    Types: Cigarettes    Quit date: 05/17/1970    Years since quitting: 50.7   Smokeless tobacco: Never   Tobacco comments:    quit 1972  Vaping Use   Vaping Use: Never used  Substance and Sexual Activity   Alcohol use: No   Drug use: No   Sexual activity: Yes  Other Topics Concern    Not on file  Social History Narrative   Remarried 1985, lives with wife   4 stepdaughters   From [place]   Retired from [employer] '[employer]-'48, 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, 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)   Social Determinants of Health   Financial Resource Strain: Not on file  Food Insecurity: Not on file  Transportation Needs: Not on file  Physical Activity: Not on file  Stress: Not on file  Social Connections: Not on file  Intimate Partner Violence: Not on file    FAMILY HISTORY: Family History  Problem Relation Age of Onset   Stroke Mother    Diabetes Sister    Heart disease Sister        CAD, AFib, GI bleed, FE deficiency   Prostate cancer Brother    Cancer Brother 62       lungs, smoker   Colon cancer Neg Hx     ALLERGIES:  is allergic to atorvastatin, ezetimibe-simvastatin, sildenafil, and simvastatin.  MEDICATIONS:  Current Outpatient Medications  Medication Sig Dispense Refill   aspirin 81 MG tablet Take 81 mg by mouth daily.     Cyanocobalamin (VITAMIN B12) 1000 MCG TBCR Take 1 tablet by mouth daily.     pantoprazole (PROTONIX) 40 MG tablet TAKE 1 TABLET BY MOUTH DAILY 90 tablet 0   VITAMIN D PO Take 1 tablet by mouth daily.     No current facility-administered medications for this visit.     PHYSICAL EXAMINATION: ECOG PERFORMANCE STATUS: 1 - Symptomatic but completely ambulatory Vitals:   02/04/21 1336  BP: (!) 177/76  Pulse: 63  Resp: 18  Temp: 98.1 F (36.7 C)  SpO2: 98%   Filed Weights   02/04/21 1336  Weight: 141 lb (64 kg)    Physical Exam Constitutional:      General: He is not in acute distress.    Comments: Patient walks independantly  HENT:     Head: Normocephalic and atraumatic.  Eyes:     General: No scleral icterus. Cardiovascular:     Rate and Rhythm: Normal rate and regular rhythm.     Heart sounds: Normal heart sounds.  Pulmonary:     Effort: Pulmonary effort is normal. No respiratory distress.     Breath sounds: No  wheezing.  Abdominal:     General: Bowel sounds are normal. There is no distension.     Palpations: Abdomen is soft.  Musculoskeletal:        General: No deformity. Normal range of motion.     Cervical back: Normal range of motion and neck supple.  Skin:    General: Skin is warm and dry.     Findings: No erythema or rash.  Neurological:     Mental Status: He is alert and oriented to person, place, and time. Mental status is at baseline.     Cranial Nerves: No cranial nerve deficit.     Coordination: Coordination normal.  Psychiatric:          Mood and Affect: Mood normal.    LABORATORY DATA:  I have reviewed the data as listed Lab Results  Component Value Date   WBC 5.2 02/04/2021   HGB 14.3 02/04/2021   HCT 41.8 02/04/2021   MCV 94.6 02/04/2021   PLT 216 02/04/2021   Recent Labs    02/25/20 0839 07/07/20 1602 02/04/21 1311  NA 138 139 136  K 4.6 4.2 4.3  CL 101 101 101  CO2 _0 GLUCOSE 100* 108* 114*  BUN _1 CREATININE 1.06 1.00 0.94  CALCIUM 9.8 9.3 9.2  GFRNONAA  --  >60 >60  PROT 7.5 7.7 7.7  ALBUMIN 4.3 4.7 4.2  AST _2 ALT _3 ALKPHOS 70 58 72  BILITOT 1.6* 2.6* 1.5*    Iron/TIBC/Ferritin/ %Sat    Component Value Date/Time   IRON 91 06/23/2012 1027   FERRITIN 71.4 06/23/2012 1027   IRONPCTSAT 30.0 06/23/2012 1027      RADIOGRAPHIC STUDIES: I have personally reviewed the radiological images as listed and agreed with the findings in the report. No results found.    ASSESSMENT & PLAN:  1. MGUS (monoclonal gammopathy of unknown significance)    IgA lambda MGUS Previous work up NT proBNP is normal,  Protein/creatinine level is also normal.Beta-2 microglobulin normal. Multiple myeloma and light chain ratio labs are pending.  Assuming these are stable, recommend him to follow up in 6 months.  Orders Placed This Encounter  Procedures   CBC with Differential/Platelet    Standing Status:   Future    Standing Expiration  Date:   02/04/2022   Comprehensive metabolic panel    Standing Status:   Future    Standing Expiration Date:   02/04/2022   Kappa/lambda light chains    Standing Status:   Future    Standing Expiration Date:   02/04/2022   Multiple Myeloma Panel (SPEP&IFE w/QIG)    Standing Status:   Future    Standing Expiration Date:   02/04/2022    All questions were answered. The patient knows to call the clinic with any problems questions or concerns.  cc Tonia Ghent, MD    Return of visit:  6 months   Earlie Server, MD, PhD  02/04/2021

## 2021-02-04 NOTE — Assessment & Plan Note (Signed)
He is going to follow-up with hematology in the near future regarding MGUS.  It would be nice if we can get all of his labs drawn together and I will contact the hematology clinic about that, with appreciation.  It is unclear to me if his gait changes are solely related to MGUS but we need to exclude other metabolic issues.  See orders.

## 2021-02-04 NOTE — Assessment & Plan Note (Signed)
Discussed with patient and his wife.  He is going to follow-up with the eye clinic.  He does not have vision loss.  This appears to be separate from his other symptoms.

## 2021-02-05 LAB — KAPPA/LAMBDA LIGHT CHAINS
Kappa free light chain: 21 mg/L — ABNORMAL HIGH (ref 3.3–19.4)
Kappa, lambda light chain ratio: 0.22 — ABNORMAL LOW (ref 0.26–1.65)
Lambda free light chains: 94.2 mg/L — ABNORMAL HIGH (ref 5.7–26.3)

## 2021-02-08 ENCOUNTER — Telehealth: Payer: Self-pay | Admitting: Family Medicine

## 2021-02-08 DIAGNOSIS — R413 Other amnesia: Secondary | ICD-10-CM

## 2021-02-08 NOTE — Telephone Encounter (Signed)
Note opened in error.

## 2021-02-08 NOTE — Telephone Encounter (Signed)
Please check with patient/wife.  Patient specifically gave [name] permission to speak to his wife at his last office visit.  I do not see a result on his labs to explain the memory change that he mentioned.  I think it makes sense to get a head CT set up and I put in the order.  He should get a call about scheduling that.  We will get the results and go from there.  Thanks.

## 2021-02-09 LAB — MULTIPLE MYELOMA PANEL, SERUM
Albumin SerPl Elph-Mcnc: 3.6 g/dL (ref 2.9–4.4)
Albumin/Glob SerPl: 1.2 (ref 0.7–1.7)
Alpha 1: 0.2 g/dL (ref 0.0–0.4)
Alpha2 Glob SerPl Elph-Mcnc: 0.9 g/dL (ref 0.4–1.0)
B-Globulin SerPl Elph-Mcnc: 0.9 g/dL (ref 0.7–1.3)
Gamma Glob SerPl Elph-Mcnc: 1.1 g/dL (ref 0.4–1.8)
Globulin, Total: 3.1 g/dL (ref 2.2–3.9)
IgA: 449 mg/dL — ABNORMAL HIGH (ref 61–437)
IgG (Immunoglobin G), Serum: 879 mg/dL (ref 603–1613)
IgM (Immunoglobulin M), Srm: 45 mg/dL (ref 15–143)
M Protein SerPl Elph-Mcnc: 0.5 g/dL — ABNORMAL HIGH
Total Protein ELP: 6.7 g/dL (ref 6.0–8.5)

## 2021-02-10 NOTE — Telephone Encounter (Signed)
Patients wife updated about message below. Advised that they will get a call from radiology about scheduled CT scan.

## 2021-03-01 ENCOUNTER — Encounter: Payer: Self-pay | Admitting: Family Medicine

## 2021-03-01 DIAGNOSIS — H353290 Exudative age-related macular degeneration, unspecified eye, stage unspecified: Secondary | ICD-10-CM | POA: Insufficient documentation

## 2021-03-03 ENCOUNTER — Encounter: Payer: Medicare Other | Admitting: Family Medicine

## 2021-03-03 ENCOUNTER — Other Ambulatory Visit: Payer: Self-pay

## 2021-03-03 ENCOUNTER — Encounter: Payer: Self-pay | Admitting: Family Medicine

## 2021-03-03 ENCOUNTER — Ambulatory Visit (INDEPENDENT_AMBULATORY_CARE_PROVIDER_SITE_OTHER): Payer: Medicare Other | Admitting: Family Medicine

## 2021-03-03 VITALS — BP 136/70 | HR 80 | Temp 97.2°F | Ht 67.0 in | Wt 148.0 lb

## 2021-03-03 DIAGNOSIS — Z7189 Other specified counseling: Secondary | ICD-10-CM

## 2021-03-03 DIAGNOSIS — J309 Allergic rhinitis, unspecified: Secondary | ICD-10-CM

## 2021-03-03 DIAGNOSIS — I6529 Occlusion and stenosis of unspecified carotid artery: Secondary | ICD-10-CM | POA: Diagnosis not present

## 2021-03-03 DIAGNOSIS — H353290 Exudative age-related macular degeneration, unspecified eye, stage unspecified: Secondary | ICD-10-CM

## 2021-03-03 DIAGNOSIS — R413 Other amnesia: Secondary | ICD-10-CM | POA: Diagnosis not present

## 2021-03-03 DIAGNOSIS — Z0000 Encounter for general adult medical examination without abnormal findings: Secondary | ICD-10-CM | POA: Diagnosis not present

## 2021-03-03 DIAGNOSIS — D472 Monoclonal gammopathy: Secondary | ICD-10-CM | POA: Diagnosis not present

## 2021-03-03 MED ORDER — LORATADINE 10 MG PO TABS: 10.0000 mg | ORAL_TABLET | Freq: Every day | ORAL | 3 refills | Status: DC

## 2021-03-03 MED ORDER — PANTOPRAZOLE SODIUM 40 MG PO TBEC: 40.0000 mg | DELAYED_RELEASE_TABLET | Freq: Every day | ORAL | 3 refills | Status: DC

## 2021-03-03 NOTE — Patient Instructions (Addendum)
Your recent labs were stable and I'll await the CT scan of your head.   I'll await the eye clinic notes.  Try taking claritin for allergies and post nasal drip.   Don't change your other meds for now.  Take care.  Glad to see you.  We'll call about getting the carotid ultrasound set up.

## 2021-03-03 NOTE — Progress Notes (Signed)
This visit occurred during the SARS-CoV-2 public health emergency.  Safety protocols were in place, including screening questions prior to the visit, additional usage of staff PPE, and extensive cleaning of exam room while observing appropriate contact time as indicated for disinfecting solutions.  I have personally reviewed the Medicare Annual Wellness questionnaire and have noted 1. The patient's medical and social history 2. Their use of alcohol, tobacco or illicit drugs 3. Their current medications and supplements 4. The patient's functional ability including ADL's, fall risks, home safety risks and hearing or visual             impairment. 5. Diet and physical activities 6. Evidence for depression or mood disorders  The patients weight, height, BMI have been recorded in the chart and visual acuity is per eye clinic.  I have made referrals, counseling and provided education to the patient based review of the above and I have provided the pt with a written personalized care plan for preventive services.  Provider list updated- see scanned forms.  Routine anticipatory guidance given to patient.  See health maintenance. The possibility exists that previously documented standard health maintenance information may have been brought forward from a previous encounter into this note.  If needed, that same information has been updated to reflect the current situation based on today's encounter.    Flu 2022 Shingles d/w pt.  PNA up to date Tetanus 2019 COVID-vaccine previously done Colon cancer screening not due given his age.  He agrees. Prostate cancer screening not due given his age.  He agrees. Advance directive-wife designated if patient were incapacitated. Cognitive function addressed- see scanned forms- and if abnormal then additional documentation follows.   In addition to [facility], follow up visit for the below conditions:  ED d/w pt.  Wouldn't use sildenafil given his hx.  discussed with patient.  He noted post nasal gtt with season change.  Discussed trial of Claritin but not Claritin-D.  D/w pt about repeat carotid ultrasound regarding stenosis.  Ordered/pending.  No new neurologic symptoms.  His vision changes were attributed to macular degeneration and not carotid disease.  Vision changes.  Dx'd with macular degeneration per eye clinic.  He is going to have injections starting tomorrow.  He quit driving in the meantime.  That is reasonable, d/w pt.    MGUS per hematology clinic.  D/w pt about MGUS dx (and possible contribution to neuropathy symptoms) and monitoring.    Memory changes.  CT pending.  B12 not low.  D/w pt. he quit driving in the meantime.  See above.  PMH and SH reviewed  Meds, vitals, and allergies reviewed.   ROS: Per HPI.  Unless specifically indicated otherwise in HPI, the patient denies:  General: fever. Eyes: acute vision changes ENT: sore throat Cardiovascular: chest pain Respiratory: SOB GI: vomiting GU: dysuria Musculoskeletal: acute back pain Derm: acute rash Neuro: acute motor dysfunction Psych: worsening mood Endocrine: polydipsia Heme: bleeding Allergy: hayfever  GEN: nad, alert and oriented HEENT: ncat NECK: supple w/o LA CV: rrr. PULM: ctab, no inc wob ABD: soft, +bs EXT: no edema SKIN: Well-perfused.

## 2021-03-09 ENCOUNTER — Ambulatory Visit
Admission: RE | Admit: 2021-03-09 | Discharge: 2021-03-09 | Disposition: A | Discharge status: Home / Self Care | Payer: Medicare Other | Source: Ambulatory Visit | Attending: Family Medicine | Admitting: Family Medicine

## 2021-03-09 ENCOUNTER — Other Ambulatory Visit: Payer: Self-pay

## 2021-03-09 DIAGNOSIS — R413 Other amnesia: Secondary | ICD-10-CM | POA: Insufficient documentation

## 2021-03-09 DIAGNOSIS — J309 Allergic rhinitis, unspecified: Secondary | ICD-10-CM | POA: Insufficient documentation

## 2021-03-09 NOTE — Assessment & Plan Note (Signed)
Per ophthalmology.  I will defer.  He agrees.

## 2021-03-09 NOTE — Assessment & Plan Note (Signed)
Follow-up ultrasound pending.

## 2021-03-09 NOTE — Assessment & Plan Note (Signed)
Per hematology.  MGUS pathophysiology discussed with patient.  Discussed routine monitoring.

## 2021-03-09 NOTE — Assessment & Plan Note (Signed)
Flu 2022 Shingles d/w pt.  PNA up to date Tetanus 2019 COVID-vaccine previously done Colon cancer screening not due given his age.  He agrees. Prostate cancer screening not due given his age.  He agrees. Advance directive-wife designated if patient were incapacitated. Cognitive function addressed- see scanned forms- and if abnormal then additional documentation follows.

## 2021-03-09 NOTE — Assessment & Plan Note (Signed)
Advance directive- wife designated if patient were incapacitated.  

## 2021-03-09 NOTE — Assessment & Plan Note (Signed)
Can try Claritin.  See above.  He will update me as needed.

## 2021-03-09 NOTE — Assessment & Plan Note (Signed)
CT pending.  B12 not low.  D/w pt. he quit driving in the meantime.   Will await CT.

## 2021-05-21 ENCOUNTER — Encounter: Payer: Self-pay | Admitting: Family Medicine

## 2021-05-21 DIAGNOSIS — G629 Polyneuropathy, unspecified: Secondary | ICD-10-CM

## 2021-05-22 ENCOUNTER — Telehealth: Payer: Self-pay | Admitting: Family Medicine

## 2021-05-22 NOTE — Telephone Encounter (Signed)
Would try OTC wrist splint at night if he hasn't already/recently tried that.  Could take 1-2 extra strength tylenol up to 3 times a day if needed for pain.  Likely reasonable to take a dose preemptively in the late afternoon to get ahead of the pain at night.  Let me know if that isn't helping.  Thanks .

## 2021-05-22 NOTE — Telephone Encounter (Signed)
I spoke with pt's wife (DPR signed); pt was dx as carpal tunnel or neuropathy by [facility] neurologist that has retired. Pt is in a lot of pain in both hands and is worse in lt hand and arms hurt slightly. The severe sharp pain has been going on for several months. Pt said Tylenol extra strength 500 mg and aspercream help slightly but not really help the pain. Pt will not take more than one Tylenol extra strength 500 mg without Dr Damita Dunnings saying OK. Pt has had previous injections in hands sometime last year that only lasted short period of time. [name] said now pain level is 2 - 3 but at night the pain level is 10 and pt feels like he wants to cry and just walks the floor. Pts wife wonders what suggestions Dr Damita Dunnings has until pt can see neurologist. [name] request cb after reviewed by Dr Damita Dunnings. [facility].

## 2021-05-22 NOTE — Telephone Encounter (Signed)
See mychart message.  I put in the referral to neuro but please triage patient about arm pain and current situation.  Thanks.

## 2021-05-22 NOTE — Telephone Encounter (Signed)
Spoke with patients wife and advised her on recommendations. [name] verbalized understanding and states they will give that a try. Advised to call back if does not help.

## 2021-06-01 ENCOUNTER — Encounter: Payer: Self-pay | Admitting: Family Medicine

## 2021-06-02 ENCOUNTER — Other Ambulatory Visit: Payer: Self-pay | Admitting: Family Medicine

## 2021-06-02 MED ORDER — BENZONATATE 200 MG PO CAPS: 200.0000 mg | ORAL_CAPSULE | Freq: Three times a day (TID) | ORAL | 1 refills | Status: DC | PRN

## 2021-06-02 MED ORDER — FLUTICASONE PROPIONATE 50 MCG/ACT NA SUSP: 2.0000 | Freq: Every day | NASAL | 1 refills | Status: DC

## 2021-06-02 NOTE — Telephone Encounter (Signed)
Called and spoke with patients wife about rxs being sent. Advised if he is no better after this to call for appt.

## 2021-06-02 NOTE — Telephone Encounter (Signed)
Please call them back.  Would try tessalon for cough and use flonase for the runny nose.  Verify if he is still taking claritin.  That may help.  If clearly worse in the meantime, then needs in person check.  Thanks.

## 2021-06-19 ENCOUNTER — Encounter: Payer: Self-pay | Admitting: Neurology

## 2021-06-19 ENCOUNTER — Ambulatory Visit (INDEPENDENT_AMBULATORY_CARE_PROVIDER_SITE_OTHER): Payer: Medicare Other | Admitting: Neurology

## 2021-06-19 VITALS — BP 156/67 | HR 58 | Ht 68.0 in | Wt 145.0 lb

## 2021-06-19 DIAGNOSIS — G5603 Carpal tunnel syndrome, bilateral upper limbs: Secondary | ICD-10-CM

## 2021-06-19 NOTE — Patient Instructions (Signed)
Emg/ncs - may not need to repeat, will see what [facility] [facility] and insurance needs(or possibly hand center can provide this at their center as needed) [place] recommend surgical intervention for Severe Carpal Tunnel Syndrome  Carpal Tunnel Syndrome Carpal tunnel syndrome is a condition that causes pain, numbness, and weakness in your hand and fingers. The carpal tunnel is a narrow area located on the palm side of your wrist. Repeated wrist motion or certain diseases may cause swelling within the tunnel. This swelling pinches the main nerve in the wrist. The main nerve in the wrist is called the median nerve. What are the causes? This condition may be caused by: Repeated and forceful wrist and hand motions. Wrist injuries. Arthritis. A cyst or tumor in the carpal tunnel. Fluid buildup during pregnancy. Use of tools that vibrate. Sometimes the cause of this condition is not known. What increases the risk? The following factors may make you more likely to develop this condition: Having a job that requires you to repeatedly or forcefully move your wrist or hand or requires you to use tools that vibrate. This may include jobs that involve using computers, working on an [employer], or working with [place] such as Pension scheme manager. Being a woman. Having certain conditions, such as: Diabetes. Obesity. An underactive thyroid (hypothyroidism). Kidney failure. Rheumatoid arthritis. What are the signs or symptoms? Symptoms of this condition include: A tingling feeling in your fingers, especially in your thumb, index, and middle fingers. Tingling or numbness in your hand. An aching feeling in your entire arm, especially when your wrist and elbow are bent for a long time. Wrist pain that goes up your arm to your shoulder. Pain that goes down into your palm or fingers. A weak feeling in your hands. You may have trouble grabbing and holding items. Your symptoms may feel worse  during the night. How is this diagnosed? This condition is diagnosed with a medical history and physical exam. You may also have tests, including: Electromyogram (EMG). This test measures electrical signals sent by your nerves into the muscles. Nerve conduction study. This test measures how well electrical signals pass through your nerves. Imaging tests, such as X-rays, ultrasound, and MRI. These tests check for possible causes of your condition. How is this treated? This condition may be treated with: Lifestyle changes. It is important to stop or change the activity that caused your condition. Doing exercise and activities to strengthen and stretch your muscles and tendons (physical therapy). Making lifestyle changes to help with your condition and learning how to do your daily activities safely (occupational therapy). Medicines for pain and inflammation. This may include medicine that is injected into your wrist. A wrist splint or brace. Surgery. Follow these instructions at home: If you have a splint or brace: Wear the splint or brace as told by your health care provider. Remove it only as told by your health care provider. Loosen the splint or brace if your fingers tingle, become numb, or turn cold and blue. Keep the splint or brace clean. If the splint or brace is not waterproof: Do not let it get wet. Cover it with a watertight covering when you take a bath or shower. Managing pain, stiffness, and swelling If directed, put ice on the painful area. To do this: If you have a removeable splint or brace, remove it as told by your health care provider. Put ice in a plastic bag. Place a towel between your skin and the bag  or between the splint or brace and the bag. Leave the ice on for 20 minutes, 2-3 times a day. Do not fall asleep with the cold pack on your skin. Remove the ice if your skin turns bright red. This is very important. If you cannot feel pain, heat, or cold, you have a  greater risk of damage to the area. Move your fingers often to reduce stiffness and swelling. General instructions Take over-the-counter and prescription medicines only as told by your health care provider. Rest your wrist and hand from any activity that may be causing your pain. If your condition is work related, talk with your employer about changes that can be made, such as getting a wrist pad to use while typing. Do any exercises as told by your health care provider, physical therapist, or occupational therapist. Keep all follow-up visits. This is important. Contact a health care provider if: You have new symptoms. Your pain is not controlled with medicines. Your symptoms get worse. Get help right away if: You have severe numbness or tingling in your wrist or hand. Summary Carpal tunnel syndrome is a condition that causes pain, numbness, and weakness in your hand and fingers. It is usually caused by repeated wrist motions. Lifestyle changes and medicines are used to treat carpal tunnel syndrome. Surgery may be recommended. Follow your health care provider's instructions about wearing a splint, resting from activity, keeping follow-up visits, and calling for help. This information is not intended to replace advice given to you by your health care provider. Make sure you discuss any questions you have with your health care provider. Document Revised: 09/13/2019 Document Reviewed: 09/13/2019 Elsevier Patient Education  [place].

## 2021-06-19 NOTE — Progress Notes (Signed)
[facility]    Provider:  Dr Jaynee Eagles Requesting Provider: Tonia Ghent, MD Primary Care Provider:  Tonia Ghent, MD  CC:  Severe Carpal Tunnel Syndrome confirmed by emg/ncs  HPI:  [name] is a 88 y.o. male here as requested by Tonia Ghent, MD for tingling and numbness in hands concerning for carpal tunnel syndrome. Testing in 09/2019 showed "Severe Carpal Tunnel Syndrome". It all started years ago, even years before EMG testing in 2021. Just the hands. No neck pain or symptoms in his neck with radicular symptoms but he does feel it in his forarm, severe pain, he is still blessed even with pain. He had injections. Numb and tingling digits 1-3, weakness, atrophy in the thumb, dropping things, pain is between midnight and the time he gets up. Can come during the day. No neck pain. No radicular symptoms. Here with his daughter who also provides much information. No other focal neurologic deficits, associated symptoms, inciting events or modifiable factors.   Reviewed notes, labs and imaging from outside physicians, which showed:  I reviewed emg/ncs performed 2021, data and report which showed: severe carpal tunnel syndrome  CT head 03/09/2021: personally reviewed imagig and agree FINDINGS: Brain: Normal anatomic configuration. No evidence of acute intracranial hemorrhage or infarct. No abnormal mass effect or midline shift. No abnormal intra or extra-axial mass lesion or fluid collection. Mild parenchymal volume loss is present though cerebral volume appears well preserved for the patient's age. Mild periventricular white matter changes, likely reflecting the sequela of small vessel ischemia, are stable since prior examination.   Vascular: Moderate atherosclerotic calcification within the carotid siphons. No asymmetric hyperdense vasculature at the skull base.   Skull: Normal. Negative for fracture or focal lesion.   Sinuses/Orbits: There is  opacification of several ethmoid air cells bilaterally as well as small amount of layering mucus within the left frontal sinus. Ocular lenses have been removed. Orbits are otherwise unremarkable.   Other: Mastoid air cells and middle ear cavities are clear.   IMPRESSION: No acute intracranial hemorrhage or infarct. Stable senescent change.   Mild bilateral paranasal sinus disease.  Review of Systems: Patient complains of symptoms per HPI as well as the following symptoms hand pain. Pertinent negatives and positives per HPI. All others negative.   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)   Social Determinants of Radio broadcast assistant Strain: Not on file  Food Insecurity: Not on file  Transportation Needs: Not on  file  Physical Activity: Not on file  Stress: Not on file  Social Connections: Not on file  Intimate Partner Violence: Not on file    Family History  Problem Relation Age of Onset   Stroke Mother    Diabetes Sister    Heart disease Sister        CAD, AFib, GI bleed, FE deficiency   Prostate cancer Brother    Cancer Brother 58       lungs, smoker   Colon cancer  Neg Hx     Past Medical History:  Diagnosis Date   BPH (benign prostatic hyperplasia) 06/1997   GERD (gastroesophageal reflux disease)    schatzki ring dilated, [facility]. [facility]. (Dr. 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   Vision changes 02/04/2021   Memory change 02/04/2021   MGUS (monoclonal gammopathy of unknown significance) 07/07/2020   History of nonmelanoma skin cancer 03/11/2020   Basal cell carcinoma 12/26/2019   Skin tear of elbow without complication 05/19/7251   Leg swelling 12/25/2018   GERD (gastroesophageal reflux disease)    Health care maintenance 01/26/2018   Glossitis 01/26/2018   History of syncope 10/12/2017   Syncope 09/23/2017   Leg pain 01/14/2017   Skin lump of arm 05/29/2015   Rash 12/15/2014   Post-nasal drip 12/15/2014   Hand tingling 12/15/2014   Hyperglycemia 11/14/2013   Medicare annual wellness visit, subsequent 11/09/2013   Paresthesia 07/01/2013   Cough 09/18/2012   Dysphagia 06/13/2012   Schatzki's ring 06/13/2012   Advance care planning 08/06/2011   Carotid stenosis 11/01/2006   HLD (hyperlipidemia) 10/29/2006   Essential hypertension 10/29/2006   BENIGN PROSTATIC HYPERTROPHY 10/29/2006   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 H. (Dr. Uvaldo Rising)    ESOPHAGOGASTRODUODENOSCOPY  06/13/2012   Procedure: ESOPHAGOGASTRODUODENOSCOPY (EGD);  Surgeon: Jerene Bears, MD;  Location: [place];  Service: Gastroenterology;  Laterality: N/A;   holter monitor  10/17/2001   ISOL, PVC's, SVC's   MRI Brain     nml, MRA brain, intracran athersc dz MCA RPCA 10/18/06, Head CT NML 10/17/06   MRI Cerv Spine  10/18/2006   mild degen changes    Current Outpatient Medications  Medication Sig Dispense Refill   aspirin 81 MG tablet Take 81 mg by mouth daily.     benzonatate (TESSALON) 200 MG capsule Take 1 capsule (200 mg total) by mouth 3 (three) times daily as needed. 30 capsule 1   Cyanocobalamin (VITAMIN B12) 1000 MCG TBCR Take 1 tablet by mouth daily.     fluticasone (FLONASE) 50 MCG/ACT nasal spray Place 2 sprays into both nostrils daily. 16 g 1   loratadine (CLARITIN) 10 MG tablet Take 1 tablet (10 mg total) by mouth daily. 90 tablet 3   pantoprazole (PROTONIX) 40 MG tablet Take  1 tablet (40 mg total) by mouth daily. 90 tablet 3   VITAMIN D PO Take 1 tablet by mouth daily.     No current facility-administered medications for this visit.    Allergies as of 06/19/2021 - Review Complete 06/19/2021  Allergen Reaction Noted   Atorvastatin  10/28/2006   Ezetimibe-simvastatin  10/28/2006   Sildenafil  03/15/2019   Simvastatin  10/28/2006    Vitals: BP (!) 156/67    Pulse (!) 58    Ht 5\' 8"  (1.727 m)    Wt 145 lb (65.8 kg)    BMI 22.05 kg/m  Last Weight:  Wt Readings from Last 1 Encounters:  06/19/21 145 lb (65.8 kg)   Last Height:   Ht Readings from Last 1 Encounters:  06/19/21 5\' 8"  (1.727 m)     Physical exam: Exam: Gen: NAD, conversant, well groomed, frail               CV: RRR, no MRG. No Carotid Bruits. No peripheral edema, warm, nontender Eyes: Conjunctivae clear without exudates or hemorrhage  Neuro: Detailed Neurologic Exam  Speech:    Speech is normal; fluent and spontaneous with normal comprehension.  Cognition:    The  patient is oriented to person, place, and time;     recent and remote memory intact;     language fluent;     normal attention, concentration,     fund of knowledge  Cranial Nerves:    The pupils are equal, round, and reactive to light. Puils too small to visualize fundi.[place] fields are full to threat. Extraocular movements are intact. Trigeminal sensation is intact and the muscles of mastication are normal. The face is symmetric. The palate elevates in the midline. Hearing impaired. Voice is normal. Shoulder shrug is normal. The tongue has normal motion without fasciculations.   Coordination:    Normal    Gait:Slightly antalgic and stooped but not parkinsonian   Motor Observation:    Atrophy thenar muscles most pronounced.  Tone:    Normal muscle tone.       Strength: Left APB 4/5, right APB 4+/5 (left hand worse than the right) slightly weak grip left > right. Otherwise 5/5 with strong interossei     Sensation: intact to LT     Reflex Exam:  DTR's: Hyporeflexic throughout but symmetrical  Toes:    The toes are symmetrical bilaterally.   Clonus:    Clonus is absent.    Assessment/Plan:  This is a darling 88 year old male who was diagnosed with severe Carpal Tunnel Syndrome by emg/ncs in 2021. He has continued to have worsening symptoms, thenar atrophy, pain. (I reviewed the testing he had done in 09/2019, reviewed CTS, showed images online, explained why he is having atrophy and weakness and discussed the median nerve and it's role in the hand, answered all questions)  - Patient clearly had severe CTS confirmed by emg/ncs 2 years ago. He needs surgical intervention. Will send him ASAP to hand surgeon. - I will order emg/ncs however given presentation and with confirmation of SEVERE CTS 2 years ago, I'm not sure he really even needs another emg/ncs, he needs surgery as soon as possible to save any residual strength and sensation in his hands. Will call the [facility]  center and see if we can get him in ASAP. [place] [facility] center may also be able to provide EMG/NCS sooner than we can if evn needed  Orders Placed This Encounter  Procedures  Ambulatory referral to Hand Surgery   NCV with EMG(electromyography)     Cc: Tonia Ghent, MD,  Tonia Ghent, MD  Sarina Ill, MD  [facility] [address] [place], [place] [address]  I spent 60 minutes of face-to-face and non-face-to-face time with patient on the  1. Bilateral carpal tunnel syndrome    diagnosis.  This included previsit chart review, lab review, study review, order entry, electronic health record documentation, patient education on the different diagnostic and therapeutic options, counseling and coordination of care, risks and benefits of management, compliance, or risk factor reduction   Phone [phone] Fax [phone]

## 2021-06-22 ENCOUNTER — Telehealth: Payer: Self-pay | Admitting: Neurology

## 2021-06-22 DIAGNOSIS — G5603 Carpal tunnel syndrome, bilateral upper limbs: Secondary | ICD-10-CM | POA: Insufficient documentation

## 2021-06-22 NOTE — Telephone Encounter (Signed)
I'm going to refer patient to hand surgery asap. I saw hm as a new patient Friday, do any of you see the consult papers? That had an old emg/ncs from Dr. Manuella Ghazi at [facility] in [place] I need to send along with the referral. Can you all look around the pods and see if you find it?  Otherwise, Ivar Drape can you get me a copy of his emg fom 2021 from dr Manuella Ghazi today?

## 2021-06-22 NOTE — Telephone Encounter (Signed)
[  8:07 AM] [name] found [name] disregard

## 2021-06-22 NOTE — Telephone Encounter (Signed)
Referral sent to the [facility]  [phone].

## 2021-07-06 ENCOUNTER — Encounter: Payer: Self-pay | Admitting: Neurology

## 2021-07-22 ENCOUNTER — Encounter: Payer: Self-pay | Admitting: Neurology

## 2021-07-28 ENCOUNTER — Inpatient Hospital Stay: Payer: Medicare Other | Attending: Nurse Practitioner

## 2021-07-28 ENCOUNTER — Other Ambulatory Visit: Payer: Self-pay

## 2021-07-28 DIAGNOSIS — Z7982 Long term (current) use of aspirin: Secondary | ICD-10-CM | POA: Insufficient documentation

## 2021-07-28 DIAGNOSIS — D472 Monoclonal gammopathy: Secondary | ICD-10-CM | POA: Diagnosis present

## 2021-07-28 DIAGNOSIS — I10 Essential (primary) hypertension: Secondary | ICD-10-CM | POA: Insufficient documentation

## 2021-07-28 DIAGNOSIS — Z79899 Other long term (current) drug therapy: Secondary | ICD-10-CM | POA: Insufficient documentation

## 2021-07-28 DIAGNOSIS — K219 Gastro-esophageal reflux disease without esophagitis: Secondary | ICD-10-CM | POA: Insufficient documentation

## 2021-07-28 DIAGNOSIS — Z8673 Personal history of transient ischemic attack (TIA), and cerebral infarction without residual deficits: Secondary | ICD-10-CM | POA: Diagnosis not present

## 2021-07-28 DIAGNOSIS — G629 Polyneuropathy, unspecified: Secondary | ICD-10-CM | POA: Insufficient documentation

## 2021-07-28 DIAGNOSIS — N400 Enlarged prostate without lower urinary tract symptoms: Secondary | ICD-10-CM | POA: Diagnosis not present

## 2021-07-28 DIAGNOSIS — E785 Hyperlipidemia, unspecified: Secondary | ICD-10-CM | POA: Insufficient documentation

## 2021-07-28 LAB — CBC WITH DIFFERENTIAL/PLATELET
Abs Immature Granulocytes: 0.01 10*3/uL (ref 0.00–0.07)
Basophils Absolute: 0 10*3/uL (ref 0.0–0.1)
Basophils Relative: 0 %
Eosinophils Absolute: 0.3 10*3/uL (ref 0.0–0.5)
Eosinophils Relative: 4 %
HCT: 41.5 % (ref 39.0–52.0)
Hemoglobin: 14.5 g/dL (ref 13.0–17.0)
Immature Granulocytes: 0 %
Lymphocytes Relative: 38 %
Lymphs Abs: 2.2 10*3/uL (ref 0.7–4.0)
MCH: 33 pg (ref 26.0–34.0)
MCHC: 34.9 g/dL (ref 30.0–36.0)
MCV: 94.3 fL (ref 80.0–100.0)
Monocytes Absolute: 0.6 10*3/uL (ref 0.1–1.0)
Monocytes Relative: 10 %
Neutro Abs: 2.8 10*3/uL (ref 1.7–7.7)
Neutrophils Relative %: 48 %
Platelets: 159 10*3/uL (ref 150–400)
RBC: 4.4 MIL/uL (ref 4.22–5.81)
RDW: 12.1 % (ref 11.5–15.5)
WBC: 5.9 10*3/uL (ref 4.0–10.5)
nRBC: 0 % (ref 0.0–0.2)

## 2021-07-28 LAB — COMPREHENSIVE METABOLIC PANEL
ALT: 17 U/L (ref 0–44)
AST: 26 U/L (ref 15–41)
Albumin: 4.1 g/dL (ref 3.5–5.0)
Alkaline Phosphatase: 61 U/L (ref 38–126)
Anion gap: 7 (ref 5–15)
BUN: 18 mg/dL (ref 8–23)
CO2: 25 mmol/L (ref 22–32)
Calcium: 9.2 mg/dL (ref 8.9–10.3)
Chloride: 102 mmol/L (ref 98–111)
Creatinine, Ser: 0.91 mg/dL (ref 0.61–1.24)
GFR, Estimated: >60 mL/min (ref >60)
Glucose, Bld: 113 mg/dL — ABNORMAL HIGH (ref 70–99)
Potassium: 4.3 mmol/L (ref 3.5–5.1)
Sodium: 134 mmol/L — ABNORMAL LOW (ref 135–145)
Total Bilirubin: 1.9 mg/dL — ABNORMAL HIGH (ref 0.3–1.2)
Total Protein: 7.2 g/dL (ref 6.5–8.1)

## 2021-07-29 LAB — MISC LABCORP TEST (SEND OUT): LabCorp test name: 143000

## 2021-07-29 LAB — KAPPA/LAMBDA LIGHT CHAINS
Kappa free light chain: 19.8 mg/L — ABNORMAL HIGH (ref 3.3–19.4)
Kappa, lambda light chain ratio: 0.2 — ABNORMAL LOW (ref 0.26–1.65)
Lambda free light chains: 97.1 mg/L — ABNORMAL HIGH (ref 5.7–26.3)

## 2021-07-31 LAB — MULTIPLE MYELOMA PANEL, SERUM
Albumin SerPl Elph-Mcnc: 4.1 g/dL (ref 2.9–4.4)
Albumin/Glob SerPl: 1.6 (ref 0.7–1.7)
Alpha 1: 0.2 g/dL (ref 0.0–0.4)
Alpha2 Glob SerPl Elph-Mcnc: 0.8 g/dL (ref 0.4–1.0)
B-Globulin SerPl Elph-Mcnc: 0.8 g/dL (ref 0.7–1.3)
Gamma Glob SerPl Elph-Mcnc: 0.9 g/dL (ref 0.4–1.8)
Globulin, Total: 2.7 g/dL (ref 2.2–3.9)
IgA: 377 mg/dL (ref 61–437)
IgG (Immunoglobin G), Serum: 854 mg/dL (ref 603–1613)
IgM (Immunoglobulin M), Srm: 50 mg/dL (ref 15–143)
M Protein SerPl Elph-Mcnc: 0.4 g/dL — ABNORMAL HIGH
Total Protein ELP: 6.8 g/dL (ref 6.0–8.5)

## 2021-08-04 ENCOUNTER — Other Ambulatory Visit: Payer: Self-pay

## 2021-08-04 ENCOUNTER — Inpatient Hospital Stay (HOSPITAL_BASED_OUTPATIENT_CLINIC_OR_DEPARTMENT_OTHER): Payer: Medicare Other | Admitting: Nurse Practitioner

## 2021-08-04 VITALS — BP 133/62 | HR 54 | Resp 18 | Ht 68.0 in | Wt 146.0 lb

## 2021-08-04 DIAGNOSIS — D472 Monoclonal gammopathy: Secondary | ICD-10-CM

## 2021-08-04 NOTE — Addendum Note (Signed)
Addended by: Leeann Must on: 08/04/2021 12:55 PM ? ? Modules accepted: Orders ? ?

## 2021-08-04 NOTE — Progress Notes (Signed)
?Hematology/Oncology Progress Note ?[place] ?[phone] [phone] ? ? ?Patient Care Team: ?Tonia Ghent, MD as PCP - General (Family Medicine) ?Teodoro Spray, MD as Consulting Physician (Cardiology) ?Madelyn Brunner, MD as Referring Physician (Neurology) ? ?REFERRING PROVIDER: ?Tonia Ghent, MD  ? ?CHIEF COMPLAINTS/REASON FOR VISIT:  ?Follow up for MGUS ? ?HISTORY OF PRESENTING ILLNESS: [name] is a  88 y.o.  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  ? ?Patient is accompanied by her daughter. 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  ? ?He sees Neurology Dr.Shah for neuropathy. EMG confirmed carpal tunnel manifesting as tingling and numbness in hands bilaterally with atrophy of APB in left hand.  ? ?As part of the work up, SPEP showed M spike of 0.3, IFE showed IgA lamda monoclonal protein. Patient was referred to hematology for further evaluation.  ? ?Denies any previous cardiovascular disease, kidney disease.  ? ?INTERVAL HISTORY ?[name] is a 88 y.o. male who has above history reviewed by me today presents for follow up visit for MGUS. He is accompanied by his wife. No new complaints.  ? ?Review of Systems  ?Constitutional:  Negative for appetite change, chills, fatigue, fever and unexpected weight change.  ?HENT:   Negative for hearing loss and voice change.   ?Eyes:  Negative for eye problems and icterus.  ?Respiratory:  Negative for chest tightness, cough and shortness of breath.   ?Cardiovascular:  Negative for chest pain and leg swelling.  ?Gastrointestinal:  Negative for abdominal distention and abdominal pain.  ?Endocrine: Negative for hot flashes.  ?Genitourinary:  Negative for difficulty urinating, dysuria and frequency.   ?Musculoskeletal:  Negative for arthralgias.  ?Skin:  Negative for itching and rash.   ?Neurological:  Positive for numbness. Negative for light-headedness.  ?Hematological:  Negative for adenopathy. Does not bruise/bleed easily.  ?Psychiatric/Behavioral:  Negative for confusion.   ? ? ?MEDICAL HISTORY:  ?Past Medical History:  ?Diagnosis Date  ? BPH (benign prostatic hyperplasia) 06/1997  ? GERD (gastroesophageal reflux disease)   ? schatzki ring dilated, H. H. (Dr. Uvaldo Rising)  ? Hyperlipemia 1990's  ? Hypertension 02/2004  ? TIA (transient ischemic attack)   ? was not on aspirin at time of TIA per patient  ? ? ?SURGICAL HISTORY: ?Past Surgical History:  ?Procedure Laterality Date  ? APPENDECTOMY  1947  ? bilateral hernia repair  1965  ? CHOLECYSTECTOMY  07/2003  ? DG CHEST FOR [employer] EMPL[employer]EE  06/02-08/2006  ? TIA, Dyslipidemia, HTN, Right Sided Carotid Artery Stenosis 40-60%  ? ESOPHAGOGASTRODUODENOSCOPY  03/23/2001  ? schatzki ring dilated, H. H. (Dr. Uvaldo Rising)  ? ESOPHAGOGASTRODUODENOSCOPY  06/13/2012  ? Procedure: ESOPHAGOGASTRODUODENOSCOPY (EGD);  Surgeon: Jerene Bears, MD;  Location: [place];  Service: Gastroenterology;  Laterality: N/A;  ? holter monitor  10/17/2001  ? ISOL, PVC's, SVC's  ? MRI Brain    ? nml, MRA brain, intracran athersc dz MCA RPCA 10/18/06, Head CT NML 10/17/06  ? MRI Cerv Spine  10/18/2006  ? mild degen changes  ? ? ?SOCIAL HISTORY: ?Social History  ? ?Socioeconomic History  ? Marital status: Married  ?  Spouse name: Not on file  ? Number of children: Not on file  ? Years of education: Not on file  ? Highest education level: Not on file  ?Occupational History  ? 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  Employer: RETIRED  ?Tobacco Use  ? Smoking status: Former  ?  Types: Cigarettes  ?  Quit date: 05/17/1970  ?  Years since quitting: 51.2  ? Smokeless tobacco: Never  ? Tobacco comments:  ?  quit 1972  ?Vaping Use  ? Vaping Use: Never used  ?Substance and Sexual Activity  ? Alcohol use: No  ? Drug use: No  ? Sexual activity: Yes  ?Other Topics  Concern  ? Not on file  ?Social History Narrative  ? Remarried 1985, lives with wife  ? 4 stepdaughters  ? From [place]  ? Retired from [employer]  ? 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 ?Social Determinants of Health  ? ?Financial Resource Strain: Not on file  ?Food Insecurity: Not on file  ?Transportation Needs: Not on file  ?Physical Activity: Not on file  ?Stress: Not on file  ?Social Connections: Not on file  ?Intimate Partner Violence: Not on file  ? ? ?FAMILY HISTORY: ?Family History  ?Problem Relation Age of Onset  ? Stroke Mother   ? Diabetes Sister   ? Heart disease Sister   ?     CAD, AFib, GI bleed, FE deficiency  ? Prostate cancer Brother   ? Cancer Brother 40  ?     lungs, smoker  ? Colon cancer Neg Hx   ? ? ?ALLERGIES:  is allergic to atorvastatin, ezetimibe-simvastatin, sildenafil, and simvastatin. ? ? ?MEDICATIONS:  ?Current Outpatient Medications  ?Medication Sig Dispense Refill  ? aspirin 81 MG tablet Take 81 mg by mouth daily.    ? Cyanocobalamin (VITAMIN B12) 1000 MCG TBCR Take 1 tablet by mouth daily.    ? loratadine (CLARITIN) 10 MG tablet Take 1 tablet (10 mg total) by mouth daily. 90 tablet 3  ? pantoprazole (PROTONIX) 40 MG tablet Take 1 tablet (40 mg total) by mouth daily. 90 tablet 3  ? VITAMIN D PO Take 1 tablet by mouth daily.    ? benzonatate (TESSALON) 200 MG capsule Take 1 capsule (200 mg total) by mouth 3 (three) times daily as needed. (Patient not taking: Reported on 08/04/2021) 30 capsule 1  ? ?No current facility-administered medications for this visit.  ? ? ?PHYSICAL EXAMINATION: ?ECOG PERFORMANCE STATUS: 1 - Symptomatic but completely ambulatory ?Vitals:  ? 08/04/21 1019  ?BP: 133/62  ?Pulse: (!) 54  ?Resp: 18  ?SpO2: 100%  ? ?Filed Weights  ? 08/04/21 1015  ?Weight: 146 lb (66.2 kg)  ? ? ?Physical Exam ?Vitals reviewed.  ?Constitutional:   ?   Appearance: He is not ill-appearing.  ?Cardiovascular:  ?   Rate and Rhythm: Normal rate and regular rhythm.   ?Pulmonary:  ?   Effort: No respiratory distress.  ?Abdominal:  ?   General: There is no distension.  ?Skin: ?   Coloration: Skin is not pale.  ?Neurological:  ?   Mental Status: He is alert and oriented to person, place, and time.  ?Psychiatric:     ?   Mood and Affect: Mood normal.     ?   Behavior: Behavior normal.  ? ? ?LABORATORY DATA:  ?I have reviewed the data as listed ?Lab Results  ?Component Value Date  ? WBC 5.9 07/28/2021  ? HGB 14.5 07/28/2021  ? HCT 41.5 07/28/2021  ? MCV 94.3 07/28/2021  ? PLT 159 07/28/2021  ? ?Recent Labs  ?  02/04/21 ?1311 07/28/21 ?  1140  ?NA 136 134*  ?K 4.3 4.3  ?CL 101 102  ?CO2 26 25  ?GLUCOSE 114* 113*  ?BUN 13 18  ?CREATININE 0.94 0.91  ?CALCIUM 9.2 9.2  ?GFRNONAA >60 >60  ?PROT 7.7 7.2  ?ALBUMIN 4.2 4.1  ?AST 29 26  ?ALT 22 17  ?ALKPHOS 72 61  ?BILITOT 1.5* 1.9*  ? ?Iron/TIBC/Ferritin/ %Sat ?   ?Component Value Date/Time  ? IRON 91 06/23/2012 1027  ? FERRITIN 71.4 06/23/2012 1027  ? IRONPCTSAT 30.0 06/23/2012 1027  ?  ? ? ?RADIOGRAPHIC STUDIES: ?I have personally reviewed the radiological images as listed and agreed with the findings in the report. ?No results found. ? ? ? ?ASSESSMENT & PLAN:  ?No diagnosis found. ? 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 Previous work up NT proBNP is normal,  Protein/creatinine level is also normal.Beta-2 microglobulin normal. M protein is stable. Kappa lambda light chain ratio is stable. NT-proBNP - 505 on 07/28/21. Normal based on age. Symptomatically, stable. Recommend continued surveillance.  ? ?6 mo- labs  ?Then day to week later see Dr Tasia Catchings for continued surveillance ? ?No orders of the defined types were placed in this encounter. ?  ?All questions were answered. The patient knows to call the clinic with any problems questions or concerns. ? ?cc ?Tonia Ghent, MD  ? ? ?Beckey Rutter, DNP, AGNP-C ?[place] [facility] ?[phone] (clinic) ?08/04/2021 ? ?

## 2021-11-26 ENCOUNTER — Telehealth: Payer: Self-pay | Admitting: Family Medicine

## 2021-11-26 DIAGNOSIS — I6529 Occlusion and stenosis of unspecified carotid artery: Secondary | ICD-10-CM

## 2021-11-26 NOTE — Telephone Encounter (Signed)
Patient called in and scheduled a physical. [name] states Dr Damita Dunnings wanted [name] to have a Carotid artery check. Patient never had it done last year and wants to know if they should have it done this year.

## 2021-11-28 ENCOUNTER — Other Ambulatory Visit: Payer: Self-pay | Admitting: Family Medicine

## 2021-11-29 NOTE — Addendum Note (Signed)
Addended by: Tonia Ghent on: 11/29/2021 10:32 AM   Modules accepted: Orders

## 2021-11-29 NOTE — Telephone Encounter (Signed)
Ordered.  Thanks.  He should get a call about scheduling.

## 2021-11-30 NOTE — Telephone Encounter (Signed)
Called and let Helene Kelp know that [place] has been ordered and will get a call to schedule.

## 2021-12-28 ENCOUNTER — Other Ambulatory Visit: Payer: Self-pay | Admitting: Family Medicine

## 2021-12-29 ENCOUNTER — Ambulatory Visit (INDEPENDENT_AMBULATORY_CARE_PROVIDER_SITE_OTHER): Payer: Medicare Other

## 2021-12-29 DIAGNOSIS — I6521 Occlusion and stenosis of right carotid artery: Secondary | ICD-10-CM

## 2021-12-29 DIAGNOSIS — I6529 Occlusion and stenosis of unspecified carotid artery: Secondary | ICD-10-CM

## 2022-01-28 ENCOUNTER — Inpatient Hospital Stay: Payer: Medicare Other | Attending: Oncology

## 2022-01-28 DIAGNOSIS — R7989 Other specified abnormal findings of blood chemistry: Secondary | ICD-10-CM | POA: Insufficient documentation

## 2022-01-28 DIAGNOSIS — Z79899 Other long term (current) drug therapy: Secondary | ICD-10-CM | POA: Insufficient documentation

## 2022-01-28 DIAGNOSIS — Z87891 Personal history of nicotine dependence: Secondary | ICD-10-CM | POA: Diagnosis not present

## 2022-01-28 DIAGNOSIS — D472 Monoclonal gammopathy: Secondary | ICD-10-CM | POA: Insufficient documentation

## 2022-01-28 DIAGNOSIS — Z7982 Long term (current) use of aspirin: Secondary | ICD-10-CM | POA: Insufficient documentation

## 2022-01-28 LAB — COMPREHENSIVE METABOLIC PANEL
ALT: 20 U/L (ref 0–44)
AST: 28 U/L (ref 15–41)
Albumin: 4.3 g/dL (ref 3.5–5.0)
Alkaline Phosphatase: 63 U/L (ref 38–126)
Anion gap: 5 (ref 5–15)
BUN: 16 mg/dL (ref 8–23)
CO2: 28 mmol/L (ref 22–32)
Calcium: 9.2 mg/dL (ref 8.9–10.3)
Chloride: 104 mmol/L (ref 98–111)
Creatinine, Ser: 0.99 mg/dL (ref 0.61–1.24)
GFR, Estimated: >60 mL/min (ref >60)
Glucose, Bld: 100 mg/dL — ABNORMAL HIGH (ref 70–99)
Potassium: 4.6 mmol/L (ref 3.5–5.1)
Sodium: 137 mmol/L (ref 135–145)
Total Bilirubin: 2.4 mg/dL — ABNORMAL HIGH (ref 0.3–1.2)
Total Protein: 7.1 g/dL (ref 6.5–8.1)

## 2022-01-28 LAB — CBC WITH DIFFERENTIAL/PLATELET
Abs Immature Granulocytes: 0.01 10*3/uL (ref 0.00–0.07)
Basophils Absolute: 0 10*3/uL (ref 0.0–0.1)
Basophils Relative: 0 %
Eosinophils Absolute: 0.1 10*3/uL (ref 0.0–0.5)
Eosinophils Relative: 3 %
HCT: 41.3 % (ref 39.0–52.0)
Hemoglobin: 14.3 g/dL (ref 13.0–17.0)
Immature Granulocytes: 0 %
Lymphocytes Relative: 38 %
Lymphs Abs: 1.8 10*3/uL (ref 0.7–4.0)
MCH: 32.4 pg (ref 26.0–34.0)
MCHC: 34.6 g/dL (ref 30.0–36.0)
MCV: 93.4 fL (ref 80.0–100.0)
Monocytes Absolute: 0.5 10*3/uL (ref 0.1–1.0)
Monocytes Relative: 11 %
Neutro Abs: 2.3 10*3/uL (ref 1.7–7.7)
Neutrophils Relative %: 48 %
Platelets: 152 10*3/uL (ref 150–400)
RBC: 4.42 MIL/uL (ref 4.22–5.81)
RDW: 12.6 % (ref 11.5–15.5)
WBC: 4.8 10*3/uL (ref 4.0–10.5)
nRBC: 0 % (ref 0.0–0.2)

## 2022-01-29 LAB — KAPPA/LAMBDA LIGHT CHAINS
Kappa free light chain: 16.4 mg/L (ref 3.3–19.4)
Kappa, lambda light chain ratio: 0.13 — ABNORMAL LOW (ref 0.26–1.65)
Lambda free light chains: 124.8 mg/L — ABNORMAL HIGH (ref 5.7–26.3)

## 2022-01-30 LAB — MISC LABCORP TEST (SEND OUT): Labcorp test code: 143000

## 2022-02-03 LAB — MULTIPLE MYELOMA PANEL, SERUM
Albumin SerPl Elph-Mcnc: 3.7 g/dL (ref 2.9–4.4)
Albumin/Glob SerPl: 1.4 (ref 0.7–1.7)
Alpha 1: 0.2 g/dL (ref 0.0–0.4)
Alpha2 Glob SerPl Elph-Mcnc: 0.7 g/dL (ref 0.4–1.0)
B-Globulin SerPl Elph-Mcnc: 0.8 g/dL (ref 0.7–1.3)
Gamma Glob SerPl Elph-Mcnc: 1 g/dL (ref 0.4–1.8)
Globulin, Total: 2.7 g/dL (ref 2.2–3.9)
IgA: 335 mg/dL (ref 61–437)
IgG (Immunoglobin G), Serum: 812 mg/dL (ref 603–1613)
IgM (Immunoglobulin M), Srm: 44 mg/dL (ref 15–143)
M Protein SerPl Elph-Mcnc: 0.3 g/dL — ABNORMAL HIGH
Total Protein ELP: 6.4 g/dL (ref 6.0–8.5)

## 2022-02-04 ENCOUNTER — Encounter: Payer: Self-pay | Admitting: Oncology

## 2022-02-04 ENCOUNTER — Inpatient Hospital Stay (HOSPITAL_BASED_OUTPATIENT_CLINIC_OR_DEPARTMENT_OTHER): Payer: Medicare Other | Admitting: Oncology

## 2022-02-04 VITALS — BP 140/64 | HR 50 | Temp 96.6°F | Resp 20 | Wt 134.8 lb

## 2022-02-04 DIAGNOSIS — D472 Monoclonal gammopathy: Secondary | ICD-10-CM

## 2022-02-04 DIAGNOSIS — R7989 Other specified abnormal findings of blood chemistry: Secondary | ICD-10-CM

## 2022-02-04 NOTE — Progress Notes (Signed)
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 Less frequently, these patients progress to AL amyloidosis, light chain deposition disease, or another lymphoproliferative disorder. .  Previous work up NT proBNP is normal, recent NT pro BNP continues to increase to 700s. Labs reviewed and discussed with patient.  Light chain ratio is worse.  Free lambda level is increasing. Patient denies any cardiovascular diseases.  No CHF symptoms.  He is not interested in seeing cardiology for further evaluation.  He agrees to have echocardiogram done for further evaluation.  Will obtain.  Elevated brain natriuretic peptide (BNP) level Check echocardiogram  Orders Placed This Encounter  Procedures   CBC with Differential/Platelet    Standing Status:   Future    Standing Expiration Date:   02/05/2023   Comprehensive metabolic panel    Standing Status:   Future    Standing Expiration Date:   02/04/2023   Kappa/lambda light chains    Standing Status:   Future    Standing Expiration Date:   02/04/2023   Multiple Myeloma Panel (SPEP&IFE w/QIG)    Standing Status:   Future    Standing Expiration Date:   02/04/2023   Miscellaneous [facility] test (send-out)    Standing Status:   Future    Standing Expiration Date:   02/05/2023    Order Specific Question:   Test name / description:    Answer:   NT-pro BNP test # [id number]   ECHOCARDIOGRAM COMPLETE    Standing Status:   Future    Standing Expiration  Date:   02/05/2023    Order Specific Question:   Where should this test be performed    Answer:   [facility]    Order Specific Question:   Perflutren DEFINITY (image enhancing agent) should be administered unless hypersensitivity or allergy exist    Answer:   Administer Perflutren    Order Specific Question:   Reason for exam-Echo    Answer:   Other-Full Diagnosis List    Order Specific Question:   Full ICD-10/Reason for Exam    Answer:   Elevated brain natriuretic peptide (BNP) level [195093]   Follow-up in 6 months, or earlier if clinically indicated. All questions were answered. The patient knows to call the clinic with any problems, questions or concerns.  Earlie Server, MD, PhD [facility] Hematology Oncology 02/04/2022   CHIEF COMPLAINTS/REASON FOR VISIT:  Follow up for MGUS  HISTORY OF PRESENTING ILLNESS:   [name] is a  88 y.o.  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   Patient is accompanied by her daughter. 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  He sees Neurology Dr.Shah for neuropathy. EMG confirmed carpal tunnel manifesting as tingling and numbness in hands bilaterally with atrophy of APB in left hand.  As part of the work up, SPEP showed M spike of 0.3, IFE showed IgA lamda monoclonal protein. Patient was referred  to hematology for further evaluation.   Denies any previous cardiovascular disease, kidney disease.   INTERVAL HISTORY [name] is a 88 y.o. male who has above history reviewed by me today presents for follow up visit for MGUS He is accompanied by his wife.  No new complaints.   Review of Systems  Constitutional:  Negative for appetite change, chills, fatigue, fever and unexpected weight change.  HENT:   Negative for hearing loss and voice change.   Eyes:  Negative for eye problems and icterus.  Respiratory:   Negative for chest tightness, cough and shortness of breath.   Cardiovascular:  Negative for chest pain and leg swelling.  Gastrointestinal:  Negative for abdominal distention and abdominal pain.  Endocrine: Negative for hot flashes.  Genitourinary:  Negative for difficulty urinating, dysuria and frequency.   Musculoskeletal:  Negative for arthralgias.  Skin:  Negative for itching and rash.  Neurological:  Positive for numbness. Negative for light-headedness.  Hematological:  Negative for adenopathy. Does not bruise/bleed easily.  Psychiatric/Behavioral:  Negative for confusion.     MEDICAL HISTORY:  Past Medical History:  Diagnosis Date   BPH (benign prostatic hyperplasia) 06/1997   GERD (gastroesophageal reflux disease)    schatzki ring dilated, [facility] (Dr. Uvaldo Rising)   Hyperlipemia 1990's   Hypertension 02/2004   TIA (transient ischemic attack)    was not on aspirin at time of TIA per patient    SURGICAL HISTORY: Past Surgical History:  Procedure Laterality Date   APPENDECTOMY  1947   bilateral hernia repair  1965   CHOLECYSTECTOMY  07/2003   DG CHEST FOR [employer] EMPLOYEE  06/02-08/2006   TIA, Dyslipidemia, HTN, Right Sided Carotid Artery Stenosis 40-60%   ESOPHAGOGASTRODUODENOSCOPY  03/23/2001   schatzki ring dilated, [facility] (Dr. Uvaldo Rising)   ESOPHAGOGASTRODUODENOSCOPY  06/13/2012   Procedure: ESOPHAGOGASTRODUODENOSCOPY (EGD);  Surgeon: Jerene Bears, MD;  Location: [place];  Service: Gastroenterology;  Laterality: N/A;   holter monitor  10/17/2001   ISOL, PVC's, SVC's   MRI Brain     nml, MRA brain, intracran athersc dz MCA RPCA 10/18/06, Head CT NML 10/17/06   MRI Cerv Spine  10/18/2006   mild degen changes    SOCIAL HISTORY: Social History   Socioeconomic History   Marital status: Married    Spouse name: Not on file   Number of children: Not on file   Years of education: Not on file   Highest education level: Not on file  Occupational History   Occupation: Retired  Administrator, 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)   Social Determinants of Radio broadcast assistant Strain: Not on file  Food Insecurity: Not on file  Transportation Needs: Not on file  Physical Activity: Not on file  Stress: Not on file  Social Connections: Not on file  Intimate Partner Violence: Not on file    FAMILY HISTORY: Family History  Problem Relation Age of Onset   Stroke Mother    Diabetes Sister    Heart disease Sister        CAD, AFib, GI bleed, FE deficiency   Prostate cancer Brother    Cancer Brother 54       lungs, smoker   Colon cancer Neg Hx     ALLERGIES:  is allergic to atorvastatin, ezetimibe-simvastatin, sildenafil, and simvastatin.  MEDICATIONS:  Current Outpatient Medications  Medication Sig Dispense Refill   aspirin 81 MG tablet Take 81 mg by mouth daily.     Cyanocobalamin (VITAMIN B12) 1000 MCG TBCR Take 1 tablet by mouth daily.     loratadine (CLARITIN) 10 MG tablet TAKE 1 TABLET BY MOUTH ONCE DAILY 90 tablet 3   pantoprazole (PROTONIX) 40 MG tablet TAKE 1 TABLET BY MOUTH ONCE DAILY 90 tablet 3   VITAMIN D PO Take 1 tablet by mouth daily.     benzonatate (TESSALON) 200 MG capsule Take 1 capsule (200 mg total) by mouth 3 (three) times daily as needed. (Patient not taking: Reported on 08/04/2021) 30 capsule 1   No current facility-administered medications for this visit.     PHYSICAL  EXAMINATION: ECOG PERFORMANCE STATUS: 1 - Symptomatic but completely ambulatory Vitals:   02/04/22 1042  BP: (!) 140/64  Pulse: (!) 50  Resp: 20  Temp: (!) 96.6 F (35.9 C)  SpO2: 99%   Filed Weights   02/04/22 1042  Weight: 134 lb 12.8 oz (61.1 kg)    Physical Exam Constitutional:      General: He is not in acute distress.    Comments: Patient walks independantly  HENT:     Head: Normocephalic and atraumatic.  Eyes:     General: No scleral icterus. Cardiovascular:     Rate and Rhythm: Normal rate and regular rhythm.     Heart sounds: Normal heart sounds.  Pulmonary:     Effort: Pulmonary effort is normal. No respiratory distress.     Breath sounds: No wheezing.  Abdominal:     General: Bowel sounds are normal. There is no distension.     Palpations: Abdomen is soft.  Musculoskeletal:        General: No deformity. Normal range of motion.     Cervical back: Normal range of motion and neck supple.  Skin:    General: Skin is warm and dry.     Findings: No erythema or rash.  Neurological:     Mental Status: He is alert and oriented to person, place, and time. Mental status is at baseline.     Cranial Nerves: No cranial nerve deficit.     Coordination: Coordination normal.  Psychiatric:        Mood and Affect: Mood normal.     LABORATORY DATA:  I have reviewed the data as listed    Latest Ref Rng & Units 01/28/2022   10:55 AM 07/28/2021   11:40 AM 02/04/2021    1:11 PM  CBC  WBC 4.0 - 10.5 K/uL 4.8  5.9  5.2   Hemoglobin 13.0 - 17.0 g/dL 14.3  14.5  14.3   Hematocrit 39.0 - 52.0 % 41.3  41.5  41.8   Platelets 150 - 400 K/uL 152  159  216       Latest Ref Rng & Units 01/28/2022   10:55 AM 07/28/2021   11:40 AM  02/04/2021    1:11 PM  CMP  Glucose 70 - 99 mg/dL 100  113  114   BUN 8 - 23 mg/dL $Remove'16  18  13   'krhRXiL$ Creatinine 0.61 - 1.24 mg/dL 0.99  0.91  0.94   Sodium 135 - 145 mmol/L 137  134  136   Potassium 3.5 - 5.1 mmol/L 4.6  4.3  4.3   Chloride 98 - 111 mmol/L  104  102  101   CO2 22 - 32 mmol/L $RemoveB'28  25  26   'rYzeYybk$ Calcium 8.9 - 10.3 mg/dL 9.2  9.2  9.2   Total Protein 6.5 - 8.1 g/dL 7.1  7.2  7.7   Total Bilirubin 0.3 - 1.2 mg/dL 2.4  1.9  1.5   Alkaline Phos 38 - 126 U/L 63  61  72   AST 15 - 41 U/L $Remo'28  26  29   'StHnI$ ALT 0 - 44 U/L $Remo'20  17  22      'Xyvqj$ RADIOGRAPHIC STUDIES: I have personally reviewed the radiological images as listed and agreed with the findings in the report. No results found.

## 2022-02-04 NOTE — Assessment & Plan Note (Addendum)
IgA lambda MGUS I discussed with patient about the diagnosis of IgA MGUS which is an asymptomatic condition which has a small risk of progression to smoldering multiple myeloma and to symptomatic multiple myeloma. Less frequently, these patients progress to AL amyloidosis, light chain deposition disease, or another lymphoproliferative disorder. .  Previous work up NT proBNP is normal, recent NT pro BNP continues to increase to 700s. Labs reviewed and discussed with patient.  Light chain ratio is worse.  Free lambda level is increasing. Patient denies any cardiovascular diseases.  No CHF symptoms.  He is not interested in seeing cardiology for further evaluation.  He agrees to have echocardiogram done for further evaluation.  Will obtain.

## 2022-02-04 NOTE — Assessment & Plan Note (Signed)
Check echocardiogram 

## 2022-02-12 ENCOUNTER — Ambulatory Visit
Admission: RE | Admit: 2022-02-12 | Discharge: 2022-02-12 | Disposition: A | Discharge status: Home / Self Care | Payer: Medicare Other | Source: Ambulatory Visit | Attending: Oncology | Admitting: Oncology

## 2022-02-12 DIAGNOSIS — I351 Nonrheumatic aortic (valve) insufficiency: Secondary | ICD-10-CM | POA: Diagnosis not present

## 2022-02-12 DIAGNOSIS — I119 Hypertensive heart disease without heart failure: Secondary | ICD-10-CM | POA: Insufficient documentation

## 2022-02-12 DIAGNOSIS — R7989 Other specified abnormal findings of blood chemistry: Secondary | ICD-10-CM | POA: Diagnosis not present

## 2022-02-12 DIAGNOSIS — I340 Nonrheumatic mitral (valve) insufficiency: Secondary | ICD-10-CM

## 2022-02-12 DIAGNOSIS — E785 Hyperlipidemia, unspecified: Secondary | ICD-10-CM | POA: Insufficient documentation

## 2022-02-12 DIAGNOSIS — I083 Combined rheumatic disorders of mitral, aortic and tricuspid valves: Secondary | ICD-10-CM | POA: Diagnosis not present

## 2022-02-12 LAB — ECHOCARDIOGRAM COMPLETE
AR max vel: 2.28 cm2
AV Area VTI: 2.45 cm2
AV Area mean vel: 2.18 cm2
AV Mean grad: 4 mmHg
AV Peak grad: 7.5 mmHg
Ao pk vel: 1.37 m/s
Area-P 1/2: 3.83 cm2
P 1/2 time: 546 msec
S' Lateral: 2.1 cm

## 2022-02-12 NOTE — Progress Notes (Signed)
*  PRELIMINARY RESULTS* Echocardiogram 2D Echocardiogram has been performed.  [name] [name] Arther Heisler 02/12/2022, 10:55 AM

## 2022-02-15 ENCOUNTER — Telehealth: Payer: Self-pay

## 2022-02-15 DIAGNOSIS — D472 Monoclonal gammopathy: Secondary | ICD-10-CM

## 2022-02-15 NOTE — Telephone Encounter (Signed)
-----   Message from Earlie Server, MD sent at 02/13/2022 10:54 AM EDT ----- Echo showed Grade II diastolic dysfunction. Recommend cardiology referral. If he agrees please refer to Dr.End

## 2022-02-15 NOTE — Telephone Encounter (Signed)
Called and spoke with wife, Helene Kelp, who will relay message to pt, but was ok with referral being sent to Dr. Darnelle Bos office. Referral entered in [place].

## 2022-03-04 ENCOUNTER — Ambulatory Visit (INDEPENDENT_AMBULATORY_CARE_PROVIDER_SITE_OTHER): Payer: Medicare Other

## 2022-03-04 VITALS — Ht 68.0 in | Wt 134.0 lb

## 2022-03-04 DIAGNOSIS — Z0000 Encounter for general adult medical examination without abnormal findings: Secondary | ICD-10-CM

## 2022-03-04 NOTE — Patient Instructions (Addendum)
[name] , Thank you for taking time to come for your Medicare Wellness Visit. I appreciate your ongoing commitment to your health goals. Please review the following plan we discussed and let me know if I can assist you in the future.   These are the goals we discussed:  Goals      DIET - INCREASE WATER INTAKE     Drink more water, daily Stay hydrated        This is a list of the screening recommended for you and due dates:  Health Maintenance  Topic Date Due   COVID-19 Vaccine (6 - Pfizer risk series) 03/20/2022*   Zoster (Shingles) Vaccine (1 of 2) 06/04/2022*   Tetanus Vaccine  09/24/2027   Pneumonia Vaccine  Completed   Flu Shot  Completed   HPV Vaccine  Aged Out  *Topic was postponed. The date shown is not the original due date.    Advanced directives: on file  Next appointment: Follow up in one year for your annual wellness visit.   Preventive Care 65 Years and Older, Male  Preventive care refers to lifestyle choices and visits with your health care provider that can promote health and wellness. What does preventive care include? A yearly physical exam. This is also called an annual well check. Dental exams once or twice a year. Routine eye exams. Ask your health care provider how often you should have your eyes checked. Personal lifestyle choices, including: Daily care of your teeth and gums. Regular physical activity. Eating a healthy diet. Avoiding tobacco and drug use. Limiting alcohol use. Practicing safe sex. Taking low doses of aspirin every day. Taking vitamin and mineral supplements as recommended by your health care provider. What happens during an annual well check? The services and screenings done by your health care provider during your annual well check will depend on your age, overall health, lifestyle risk factors, and family history of disease. Counseling  Your health care provider may ask you questions about your: Alcohol use. Tobacco  use. Drug use. Emotional well-being. Home and relationship well-being. Sexual activity. Eating habits. History of falls. Memory and ability to understand (cognition). Work and work Statistician. Screening  You may have the following tests or measurements: Height, weight, and BMI. Blood pressure. Lipid and cholesterol levels. These may be checked every 5 years, or more frequently if you are over 70 years old. Skin check. Lung cancer screening. You may have this screening every year starting at age 22 if you have a 30-pack-year history of smoking and currently smoke or have quit within the past 15 years. Fecal occult blood test (FOBT) of the stool. You may have this test every year starting at age 65. Flexible sigmoidoscopy or colonoscopy. You may have a sigmoidoscopy every 5 years or a colonoscopy every 10 years starting at age 21. Prostate cancer screening. Recommendations will vary depending on your family history and other risks. Hepatitis C blood test. Hepatitis B blood test. Sexually transmitted disease (STD) testing. Diabetes screening. This is done by checking your blood sugar (glucose) after you have not eaten for a while (fasting). You may have this done every 1-3 years. Abdominal aortic aneurysm (AAA) screening. You may need this if you are a current or former smoker. Osteoporosis. You may be screened starting at age 21 if you are at high risk. Talk with your health care provider about your test results, treatment options, and if necessary, the need for more tests. Vaccines  Your health care provider may  recommend certain vaccines, such as: Influenza vaccine. This is recommended every year. Tetanus, diphtheria, and acellular pertussis (Tdap, Td) vaccine. You may need a Td booster every 10 years. Zoster vaccine. You may need this after age 58. Pneumococcal 13-valent conjugate (PCV13) vaccine. One dose is recommended after age 43. Pneumococcal polysaccharide (PPSV23) vaccine.  One dose is recommended after age 1. Talk to your health care provider about which screenings and vaccines you need and how often you need them. This information is not intended to replace advice given to you by your health care provider. Make sure you discuss any questions you have with your health care provider. Document Released: 05/30/2015 Document Revised: 01/21/2016 Document Reviewed: 03/04/2015 Elsevier Interactive Patient Education  2017 [employer] Prevention in the Home Falls can cause injuries. They can happen to people of all ages. There are many things you can do to make your home safe and to help prevent falls. What can I do on the outside of my home? Regularly fix the edges of walkways and driveways and fix any cracks. Remove anything that might make you trip as you walk through a door, such as a raised step or threshold. Trim any bushes or trees on the path to your home. Use bright outdoor lighting. Clear any walking paths of anything that might make someone trip, such as rocks or tools. Regularly check to see if handrails are loose or broken. Make sure that both sides of any steps have handrails. Any raised decks and porches should have guardrails on the edges. Have any leaves, snow, or ice cleared regularly. Use sand or salt on walking paths during winter. Clean up any spills in your garage right away. This includes oil or grease spills. What can I do in the bathroom? Use night lights. Install grab bars by the toilet and in the tub and shower. Do not use towel bars as grab bars. Use non-skid mats or decals in the tub or shower. If you need to sit down in the shower, use a plastic, non-slip stool. Keep the floor dry. Clean up any water that spills on the floor as soon as it happens. Remove soap buildup in the tub or shower regularly. Attach bath mats securely with double-sided non-slip rug tape. Do not have throw rugs and other things on the floor that can make  you trip. What can I do in the bedroom? Use night lights. Make sure that you have a light by your bed that is easy to reach. Do not use any sheets or blankets that are too big for your bed. They should not hang down onto the floor. Have a firm chair that has side arms. You can use this for support while you get dressed. Do not have throw rugs and other things on the floor that can make you trip. What can I do in the kitchen? Clean up any spills right away. Avoid walking on wet floors. Keep items that you use a lot in easy-to-reach places. If you need to reach something above you, use a strong step stool that has a grab bar. Keep electrical cords out of the way. Do not use floor polish or wax that makes floors slippery. If you must use wax, use non-skid floor wax. Do not have throw rugs and other things on the floor that can make you trip. What can I do with my stairs? Do not leave any items on the stairs. Make sure that there are handrails on both sides of  the stairs and use them. Fix handrails that are broken or loose. Make sure that handrails are as long as the stairways. Check any carpeting to make sure that it is firmly attached to the stairs. Fix any carpet that is loose or worn. Avoid having throw rugs at the top or bottom of the stairs. If you do have throw rugs, attach them to the floor with carpet tape. Make sure that you have a light switch at the top of the stairs and the bottom of the stairs. If you do not have them, ask someone to add them for you. What else can I do to help prevent falls? Wear shoes that: Do not have high heels. Have rubber bottoms. Are comfortable and fit you well. Are closed at the toe. Do not wear sandals. If you use a stepladder: Make sure that it is fully opened. Do not climb a closed stepladder. Make sure that both sides of the stepladder are locked into place. Ask someone to hold it for you, if possible. Clearly mark and make sure that you can  see: Any grab bars or handrails. First and last steps. Where the edge of each step is. Use tools that help you move around (mobility aids) if they are needed. These include: Canes. Walkers. Scooters. Crutches. Turn on the lights when you go into a dark area. Replace any light bulbs as soon as they burn out. Set up your furniture so you have a clear path. Avoid moving your furniture around. If any of your floors are uneven, fix them. If there are any pets around you, be aware of where they are. Review your medicines with your doctor. Some medicines can make you feel dizzy. This can increase your chance of falling. Ask your doctor what other things that you can do to help prevent falls. This information is not intended to replace advice given to you by your health care provider. Make sure you discuss any questions you have with your health care provider. Document Released: 02/27/2009 Document Revised: 10/09/2015 Document Reviewed: 06/07/2014 Elsevier Interactive Patient Education  2017 [employer].

## 2022-03-04 NOTE — Progress Notes (Signed)
Subjective:   TIA GELB is a 88 y.o. male who presents for Medicare Annual/Subsequent preventive examination.  Review of Systems    No ROS.  Medicare Wellness Virtual Visit.  Visual/audio telehealth visit, UTA vital signs.   See social history for additional risk factors.   Cardiac Risk Factors include: advanced age (>85mn, >>61women);male gender     Objective:    Today's Vitals   03/04/22 1329  Weight: 134 lb (60.8 kg)  Height: '5\' 8"'$  (1.727 m)   Body mass index is 20.37 kg/m.     03/04/2022    1:39 PM 02/04/2022   10:46 AM 02/04/2021    1:40 PM 07/23/2020    2:00 PM 07/07/2020    3:08 PM 01/23/2018    3:34 PM 09/23/2017    8:56 AM  Advanced Directives  Does Patient Have a Medical Advance Directive? Yes No Yes No No Yes Yes  Type of AParamed[place]of [place] City[place] will  Healthcare Power of [place]  Living will [place] will  Does patient want to make changes to medical advance directive? No - Patient declined      No - Patient declined  Copy of [place] Chart? Yes - validated most recent copy scanned in chart (See row information)  Yes - validated most recent copy scanned in chart (See row information)    Yes  Would patient like information on creating a medical advance directive?  No - Patient declined  No - Patient declined       Current Medications (verified) Outpatient Encounter Medications as of 03/04/2022  Medication Sig   aspirin 81 MG tablet Take 81 mg by mouth daily.   Cyanocobalamin (VITAMIN B12) 1000 MCG TBCR Take 1 tablet by mouth daily.   loratadine (CLARITIN) 10 MG tablet TAKE 1 TABLET BY MOUTH ONCE DAILY   pantoprazole (PROTONIX) 40 MG tablet TAKE 1 TABLET BY MOUTH ONCE DAILY   VITAMIN D PO Take 1 tablet by mouth daily.   No facility-administered encounter medications on file as of 03/04/2022.    Allergies (verified) Atorvastatin, Ezetimibe-simvastatin, Sildenafil, and  Simvastatin   History: Past Medical History:  Diagnosis Date   BPH (benign prostatic hyperplasia) 06/1997   GERD (gastroesophageal reflux disease)    schatzki ring dilated, [facility]. [facility] (Dr. KUvaldo Rising   Hyperlipemia 1990's   Hypertension 02/2004   TIA (transient ischemic attack)    was not on aspirin at time of TIA per patient   Past Surgical History:  Procedure Laterality Date   APPENDECTOMY  1947   bilateral hernia repair  1965   CHOLECYSTECTOMY  07/2003   DG CHEST FOR [employer] EMPLOYEE  06/02-08/2006   TIA, Dyslipidemia, HTN, Right Sided Carotid Artery Stenosis 40-60%   ESOPHAGOGASTRODUODENOSCOPY  03/23/2001   schatzki ring dilated, [facility]. [facility] (Dr. KUvaldo Rising   ESOPHAGOGASTRODUODENOSCOPY  06/13/2012   Procedure: ESOPHAGOGASTRODUODENOSCOPY (EGD);  Surgeon: JJerene Bears MD;  [facility]  Service: Gastroenterology;  Laterality: N/A;   holter monitor  10/17/2001   ISOL, PVC's, SVC's   MRI Brain     nml, MRA brain, intracran athersc dz MCA RPCA 10/18/06, Head CT NML 10/17/06   MRI Cerv Spine  10/18/2006   mild degen changes   Family History  Problem Relation Age of Onset   Stroke Mother    Diabetes Sister    Heart disease Sister        CAD, AFib, GI bleed, FE deficiency   Prostate  cancer Brother    Cancer Brother 54       lungs, smoker   Colon cancer Neg Hx    Social History   Socioeconomic History   Marital status: Married    Spouse name: Not on file   Number of children: Not on file   Years of education: Not on file   Highest education level: Not on file  Occupational History   Occupation: Retired Administrator, 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)   Social Determinants of Health   Financial Resource Strain: [place]  (03/04/2022)   Overall Financial Resource Strain (CARDIA)    Difficulty of Paying Living Expenses: Not hard at all  Food Insecurity: No Food Insecurity (03/04/2022)   Hunger Vital Sign    Worried About Running Out of Food in the Last Year: Never true    [place] in the Last Year: Never true  Transportation Needs: No Transportation Needs (03/04/2022)   PRAPARE - Hydrologist (Medical): No    Lack of Transportation (Non-Medical): No  Physical Activity: Inactive (03/04/2022)   Exercise Vital Sign    Days of Exercise per Week: 0 days    Minutes of Exercise per Session: 0 min  Stress: No Stress Concern Present (03/04/2022)   [place]    Feeling of Stress : Not at all  Social Connections: Unknown (03/04/2022)   Social Connection and Isolation Panel [NHANES]    Frequency of Communication with Friends and Family: Not on file    Frequency of Social Gatherings with Friends and Family: Not on file    Attends Religious Services: Not on file    Active Member of Clubs or Organizations: Not on file    Attends Archivist Meetings: Not on file    Marital Status: Married    Tobacco Counseling Counseling given: Not Answered Tobacco comments: quit 1972   Clinical Intake:  Pre-visit preparation completed: Yes        Diabetes: No  How often do you need to have someone help you when you read instructions, pamphlets, or other written materials from your doctor or pharmacy?: 3 - Sometimes    Interpreter Needed?: No      Activities of Daily Living    03/04/2022    1:20 PM  In  your present state of health, do you have any difficulty performing the following activities:  Hearing? 1  Comment Hearing aids  Vision? 1  Comment Macular degeneration  Difficulty concentrating or making decisions? 0  Comment Age appropriate  Walking or climbing stairs? 0  Comment Paces self  Dressing or bathing? 0  Doing errands, shopping? 1  Comment [place] and eating ? Darreld Mclean  Comment Wife meal prep. Self feeds.  Using the Toilet? N  In the past six months, have you accidently leaked urine? N  Do you have problems with loss of bowel control? N  Managing your Medications? Y  Comment Wife assist  Managing your Finances? Y  Comment wife assist  Housekeeping or managing your Housekeeping? Y  Comment Wife assist    Patient Care Team: Tonia Ghent, MD as PCP - General (Family Medicine) Ubaldo Glassing Javier Docker, MD as Consulting Physician (Cardiology) Madelyn Brunner, MD as Referring Physician (Neurology)  Indicate any recent Medical Services you may have received from other than [facility] providers in the past year (date may be approximate).     Assessment:   This is a routine wellness examination for Jezreel.  I connected with  Arnet Hofferber Eichhorst on 03/04/22 by a audio enabled telemedicine application and verified that I am speaking with the correct person using two identifiers.  Patient Location: Home  Provider Location: Office/Clinic  I discussed the limitations of evaluation and management by telemedicine. The patient expressed understanding and agreed to proceed.   Hearing/Vision screen Hearing Screening - Comments:: Wears bilateral hearing aids.  Vision Screening - Comments:: Followed by Dr. Kerin Ransom, [facility] Followed by Dr. Edison Pace, [facility] Wears corrective lenses Macular degeneration; injections received every 7 weeks Cataract extraction, bilateral They have seen their ophthalmologist in the last 12 months.    Dietary issues and exercise  activities discussed: Current Exercise Habits: Home exercise routine, Type of exercise: walking, Intensity: Mild Healthy diet Fair water intake   Goals Addressed             This Visit's Progress    DIET - INCREASE WATER INTAKE       Drink more water, daily Stay hydrated       Depression Screen    03/04/2022    1:21 PM 02/28/2020   12:27 PM 02/01/2019   11:19 AM 01/23/2018   11:42 AM 01/13/2017   12:21 PM 12/15/2015   11:07 AM 12/13/2014   10:47 AM  PHQ 2/9 Scores  PHQ - 2 Score 0 0 0 0 0 0 0    Fall Risk    03/04/2022    1:21 PM 06/19/2021    9:55 AM 03/03/2021   10:00 AM 02/28/2020   12:26 PM 02/01/2019   11:18 AM  Nowthen in the past year? 0 1 0 1 0  Number falls in past yr: 0 1 0 1   Injury with Fall? 0 0 0 0   Follow up   Falls evaluation completed      FALL RISK PREVENTION PERTAINING TO THE HOME: Home free of loose throw rugs in walkways, pet beds, electrical cords, etc? Yes  Adequate lighting in your home to reduce risk of falls? Yes   ASSISTIVE DEVICES UTILIZED TO PREVENT FALLS: Life alert? No  Use of a cane, walker or w/c? No  Grab bars in the bathroom? No  Shower chair or bench in shower? No  Elevated toilet seat or a handicapped toilet? No   TIMED UP AND GO: Was the test performed? No .   Cognitive Function:    01/23/2018    1:34 PM  MMSE - Mini Mental State Exam  Orientation to time 5  Orientation to Place 5  Registration 3  Attention/ Calculation 0  Recall 2  Recall-comments unable to recall 1 of 3 words  Language- name 2 objects 0  Language-  repeat 1  Language- follow 3 step command 3  Language- read & follow direction 0  Write a sentence 0  Copy design 0  Total score 19        03/04/2022    1:22 PM  6CIT Screen  What Year? 0 points  What month? 0 points  What time? 0 points  Count back from 20 0 points  Months in reverse 0 points  Repeat phrase 4 points  Total Score 4 points    Immunizations Immunization  History  Administered Date(s) Administered   Fluad Quad(high Dose 65+) 02/01/2019, 02/28/2020, 02/03/2021, 01/26/2022   Influenza Inj Mdck Quad Pf 02/01/2019   Influenza,inj,Quad PF,6+ Mos 03/20/2014, 01/23/2018   Influenza-Unspecified 03/20/2014, 03/08/2016, 02/15/2017, 01/23/2018   PFIZER Comirnaty(Gray Top)Covid-19 Tri-Sucrose Vaccine 09/06/2019, 09/27/2019   PFIZER(Purple Top)SARS-COV-2 Vaccination 09/06/2019, 09/27/2019, 04/08/2020   Pneumococcal Conjugate-13 12/13/2014   Pneumococcal Polysaccharide-23 05/17/1998   Td 08/05/2003   Tdap 08/10/2013, 09/23/2017   Covid-19 vaccine status: Completed vaccines x5.  Shingrix Completed?: No.    Education has been provided regarding the importance of this vaccine. Patient has been advised to call insurance company to determine out of pocket expense if they have not yet received this vaccine. Advised may also receive vaccine at local pharmacy or Health Dept. Verbalized acceptance and understanding.  Screening Tests Health Maintenance  Topic Date Due   COVID-19 Vaccine (6 - Pfizer risk series) 03/20/2022 (Originally 06/03/2020)   Zoster Vaccines- Shingrix (1 of 2) 06/04/2022 (Originally 03/27/1943)   TETANUS/TDAP  09/24/2027   Pneumonia Vaccine 20+ Years old  Completed   INFLUENZA VACCINE  Completed   HPV VACCINES  Aged Out    Health Maintenance There are no preventive care reminders to display for this patient.  Lung Cancer Screening: (Low Dose CT Chest recommended if Age 59-80 years, 30 pack-year currently smoking OR have quit w/in 15years.) does not qualify.   Hepatitis C Screening: does not qualify.  Vision Screening: Recommended annual ophthalmology exams for early detection of glaucoma and other disorders of the eye.  Dental Screening: Recommended annual dental exams for proper oral hygiene  Community Resource Referral / Chronic Care Management: CRR required this visit?  No   CCM required this visit?  No      Plan:      I have personally reviewed and noted the following in the patient's chart:   Medical and social history Use of alcohol, tobacco or illicit drugs  Current medications and supplements including opioid prescriptions. Patient is not currently taking opioid prescriptions. Functional ability and status Nutritional status Physical activity Advanced directives List of other physicians Hospitalizations, surgeries, and ER visits in previous 12 months Vitals Screenings to include cognitive, depression, and falls Referrals and appointments  In addition, I have reviewed and discussed with patient certain preventive protocols, quality metrics, and best practice recommendations. A written personalized care plan for preventive services as well as general preventive health recommendations were provided to patient.     Leta Jungling, LPN   [id number]

## 2022-03-05 ENCOUNTER — Ambulatory Visit (INDEPENDENT_AMBULATORY_CARE_PROVIDER_SITE_OTHER): Payer: Medicare Other | Admitting: Family Medicine

## 2022-03-05 ENCOUNTER — Encounter: Payer: Self-pay | Admitting: Family Medicine

## 2022-03-05 VITALS — BP 118/62 | HR 55 | Temp 97.7°F | Ht 68.0 in | Wt 134.0 lb

## 2022-03-05 DIAGNOSIS — H353290 Exudative age-related macular degeneration, unspecified eye, stage unspecified: Secondary | ICD-10-CM

## 2022-03-05 DIAGNOSIS — R202 Paresthesia of skin: Secondary | ICD-10-CM | POA: Diagnosis not present

## 2022-03-05 DIAGNOSIS — D472 Monoclonal gammopathy: Secondary | ICD-10-CM

## 2022-03-05 DIAGNOSIS — R0982 Postnasal drip: Secondary | ICD-10-CM | POA: Diagnosis not present

## 2022-03-05 DIAGNOSIS — Z87898 Personal history of other specified conditions: Secondary | ICD-10-CM | POA: Diagnosis not present

## 2022-03-05 DIAGNOSIS — R413 Other amnesia: Secondary | ICD-10-CM | POA: Diagnosis not present

## 2022-03-05 LAB — VITAMIN B12: Vitamin B-12: 939 pg/mL — ABNORMAL HIGH (ref 211–911)

## 2022-03-05 LAB — TSH: TSH: 1.97 u[IU]/mL (ref 0.35–5.50)

## 2022-03-05 MED ORDER — FLUTICASONE PROPIONATE 50 MCG/ACT NA SUSP: 2.0000 | Freq: Every day | NASAL | Status: DC

## 2022-03-05 NOTE — Progress Notes (Unsigned)
We talked about his vision and his carpal tunnel/neuropathy sx.  He has injections for macular changes/vision changes.  He stopped driving in the meantime.  I advised him not to restart driving.  He has more trouble with neuropathy sx and that complicates his daily activity, with fine motor activity.  Dec sensation B hands, he has trouble with buttons, etc.    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  D/w pt about rationale for hematology f/u.  He agreed to continue.    No other episodes of syncope in the meantime.  He isn't lightheaded.  We talked about ED and sildenafil use.  My concern is about ADE on med.  I advised him not to use sildenafil.  Prev carotid u/s from 12/2021 d/w pt.    Fall cautions d/w pt, he missed a step when picking up a package on the porch, this was > 1 year ago.    He still has rhinorrhea in spite of claritin use.  Discussed Flonase use.  "My memory is slipping."  Some trouble with word or name recall.  Oriented to year, month, day of the week.  3/3 attention.  Can do math and read a clock.  0/3 on recall.  He is okay with me talking to his wife about his memory.  We agreed to check his labs first, to look for reversible causes.  Meds, vitals, and allergies reviewed.   ROS: Per HPI unless specifically indicated in ROS section   GEN: nad, alert and oriented, 3/3 attention.  Can do math and read a clock.  0/3 on recall.  HEENT: ncat, nasal congestion with clear rhinorrhea. NECK: supple w/o LA CV: rrr PULM: ctab, no inc wob ABD: soft, +bs EXT: no edema SKIN: well perfused.    35 minutes were devoted to patient care in this encounter (this includes time spent reviewing the patient's file/history, interviewing and examining the patient, counseling/reviewing plan with patient).

## 2022-03-05 NOTE — Patient Instructions (Addendum)
Thank you for not driving.  I think that stopping driving makes sense.   Try flonase for the runny nose.  It may help and you can get it over the counter.  It doesn't work immediately but it may help after about 2 weeks.   Let me get your labs done and we'll go from there.   Take care.  Glad to see you.

## 2022-03-07 NOTE — Assessment & Plan Note (Signed)
With abdominal history, per outside clinic.

## 2022-03-07 NOTE — Assessment & Plan Note (Signed)
Per outside clinic.  I advised him not to drive.

## 2022-03-07 NOTE — Assessment & Plan Note (Signed)
He is okay with me talking to his wife about his memory.  We agreed to check his labs first, to look for reversible causes.

## 2022-03-07 NOTE — Assessment & Plan Note (Signed)
My concern is about inducing orthostatic symptoms with sildenafil use and I encouraged him not to take that medication.  See above.

## 2022-03-07 NOTE — Assessment & Plan Note (Signed)
Reasonable to trial Flonase.  Update me as needed.

## 2022-03-07 NOTE — Assessment & Plan Note (Signed)
Would keep follow-up with hematology, see above.

## 2022-03-14 ENCOUNTER — Other Ambulatory Visit: Payer: Self-pay | Admitting: Family Medicine

## 2022-03-14 DIAGNOSIS — R413 Other amnesia: Secondary | ICD-10-CM

## 2022-04-15 ENCOUNTER — Ambulatory Visit: Payer: Medicare Other | Attending: Internal Medicine | Admitting: Internal Medicine

## 2022-04-15 ENCOUNTER — Encounter: Payer: Self-pay | Admitting: Internal Medicine

## 2022-04-15 VITALS — BP 124/58 | HR 59 | Ht 67.0 in | Wt 135.8 lb

## 2022-04-15 DIAGNOSIS — I10 Essential (primary) hypertension: Secondary | ICD-10-CM

## 2022-04-15 DIAGNOSIS — I5032 Chronic diastolic (congestive) heart failure: Secondary | ICD-10-CM

## 2022-04-15 DIAGNOSIS — I493 Ventricular premature depolarization: Secondary | ICD-10-CM | POA: Diagnosis not present

## 2022-04-15 DIAGNOSIS — D472 Monoclonal gammopathy: Secondary | ICD-10-CM | POA: Diagnosis not present

## 2022-04-15 NOTE — Patient Instructions (Signed)
Medication Instructions:  Your Physician recommend you continue on your current medication as directed.    *If you need a refill on your cardiac medications before your next appointment, please call your pharmacy*   Lab Work: None ordered today   Testing/Procedures: None ordered today   Follow-Up: At [facility], you and your health needs are our priority.  As part of our continuing mission to provide you with exceptional heart care, we have created designated Provider Care Teams.  These Care Teams include your primary Cardiologist (physician) and Advanced Practice Providers (APPs -  Physician Assistants and Nurse Practitioners) who all work together to provide you with the care you need, when you need it.  We recommend signing up for the patient portal called "MyChart".  Sign up information is provided on this After Visit Summary.  MyChart is used to connect with patients for Virtual Visits (Telemedicine).  Patients are able to view lab/test results, encounter notes, upcoming appointments, etc.  Non-urgent messages can be sent to your provider as well.   To learn more about what you can do with MyChart, go to [email or web address].    Your next appointment:   2 month(s)  The format for your next appointment:   In Person  Provider:   You may see Nelva Bush, MD or one of the following Advanced Practice Providers on your designated Care Team:   Murray Hodgkins, NP Christell Faith, PA-C Cadence Kathlen Mody, PA-C Gerrie Nordmann, NP

## 2022-04-15 NOTE — Progress Notes (Signed)
New Outpatient Visit Date: 04/15/2022  Referring Provider: Tonia Ghent, MD [place],  [place] [address]  Chief Complaint: Abnormal echocardiogram  HPI:  [name] is a 88 y.o. male who is being seen today for the evaluation of diastolic dysfunction on echocardiogram at the request of Dr. Damita Dunnings. He has a history of hypertension, hyperlipidemia, TIA, MGUS, BPH, and GERD.  Echocardiogram in late September was performed for evaluation of rising NT-proBNP in the setting of MGUS.  It revealed normal LVEF with moderate LVH and grade 2 diastolic dysfunction.  Moderate pulmonary hypertension was also noted (RVSP 58 mmHg) as well as moderate mitral and mild-moderate tricuspid regurgitation.  Today, [name] reports that he is mostly bothered by his hands, which has been affected by severe carpal tunnel syndrome.  He notes that his hands feel "slick is glass."  From a heart standpoint, he feels fairly well, denying chest pain, shortness of breath, palpitations, edema and lightheadedness.  However, on further questioning, he reports exertional fatigue and some shortness of breath when walking more than a block.  [name] notes at least 2 syncopal episodes over the last few years.  He cannot provide specifics about the episodes but notes that the events prompted Dr. Damita Dunnings to revoke his driving privileges.  He has never undergone heart testing in the past.  --------------------------------------------------------------------------------------------------  Cardiovascular History & Procedures: Cardiovascular Problems: Diastolic dysfunction in the setting of MGUS Syncope PVC's  Risk Factors: Hypertension, hyperlipidemia, TIA, male gender, prior tobacco use, and age greater than 25  Cath/PCI: None  CV Surgery: None  EP Procedures and Devices: None  Non-Invasive Evaluation(s): TTE (02/12/2022): Normal LV size and wall motion with moderate LVH.  LVEF 55-60% with grade  2 diastolic dysfunction.  Normal RV size and function with moderate pulmonary hypertension (RVSP 58 mmHg).  Mild biatrial enlargement.  Structurally normal mitral valve with moderate regurgitation.  Mild-moderate tricuspid regurgitation.  Aortic sclerosis without stenosis.  Recent CV Pertinent Labs: Lab Results  Component Value Date   CHOL 176 02/04/2021   HDL 36 (L) 02/04/2021   LDLCALC 123 (H) 02/04/2021   LDLDIRECT 152.2 06/12/2012   TRIG 87 02/04/2021   CHOLHDL 4.9 02/04/2021   INR 1.2 10/17/2006   K 4.6 01/28/2022   BUN 16 01/28/2022   CREATININE 0.99 01/28/2022    --------------------------------------------------------------------------------------------------  Past Medical History:  Diagnosis Date   BPH (benign prostatic hyperplasia) 06/1997   GERD (gastroesophageal reflux disease)    schatzki ring dilated, H. H. (Dr. Uvaldo Rising)   Hyperlipemia 1990's   Hypertension 02/2004   TIA (transient ischemic attack)    was not on aspirin at time of TIA per patient    Past Surgical History:  Procedure Laterality Date   APPENDECTOMY  1947   bilateral hernia repair  1965   CHOLECYSTECTOMY  07/2003   DG CHEST FOR [employer] EMPLOYEE  06/02-08/2006   TIA, Dyslipidemia, HTN, Right Sided Carotid Artery Stenosis 40-60%   ESOPHAGOGASTRODUODENOSCOPY  03/23/2001   schatzki ring dilated, H. H. (Dr. Uvaldo Rising)   ESOPHAGOGASTRODUODENOSCOPY  06/13/2012   Procedure: ESOPHAGOGASTRODUODENOSCOPY (EGD);  Surgeon: Jerene Bears, MD;  Location: [facility];  Service: Gastroenterology;  Laterality: N/A;   holter monitor  10/17/2001   ISOL, PVC's, SVC's   MRI Brain     nml, MRA brain, intracran athersc dz MCA RPCA 10/18/06, Head CT NML 10/17/06   MRI Cerv Spine  10/18/2006   mild degen changes    Current Meds  Medication Sig  aspirin 81 MG tablet Take 81 mg by mouth daily.   Cyanocobalamin (VITAMIN B12) 1000 MCG TBCR Take 1 tablet by mouth daily.   loratadine (CLARITIN) 10 MG tablet TAKE 1 TABLET BY  MOUTH ONCE DAILY   pantoprazole (PROTONIX) 40 MG tablet TAKE 1 TABLET BY MOUTH ONCE DAILY   VITAMIN D PO Take 1 tablet by mouth daily.    Allergies: Atorvastatin, Ezetimibe-simvastatin, Sildenafil, and Simvastatin  Social History   Tobacco Use   Smoking status: Former    Packs/day: 0.50    Years: 55.00    Total pack years: 27.50    Types: Cigarettes    Quit date: 05/17/1970    Years since quitting: 51.9   Smokeless tobacco: Never   Tobacco comments:    quit 1972  Vaping Use   Vaping Use: Never used  Substance Use Topics   Alcohol use: No   Drug use: No    Family History  Problem Relation Age of Onset   Stroke Mother    Diabetes Sister    Heart disease Sister        CAD, AFib, GI bleed, FE deficiency   Prostate cancer Brother    Cancer Brother 12       lungs, smoker   Colon cancer Neg Hx     Review of Systems: A 12-system review of systems was performed and was negative except as noted in the HPI.  --------------------------------------------------------------------------------------------------  Physical Exam: BP (!) 124/58 (BP Location: Right Arm)   Pulse (!) 59   Ht '5\' 7"'$  (1.702 m)   Wt 135 lb 12.8 oz (61.6 kg)   SpO2 93%   BMI 21.27 kg/m   General: NAD.  Accompanied by his wife. HEENT: No conjunctival pallor or scleral icterus. Neck: Supple without lymphadenopathy, thyromegaly, JVD, or HJR. No carotid bruit. Lungs: Normal work of breathing. Clear to auscultation bilaterally without wheezes or crackles. Heart: Irregular rhythm with 1/6 systolic murmur.  Nondisplaced PMI. Abd: Bowel sounds present. Soft, NT/ND without hepatosplenomegaly Ext: No lower extremity edema. Radial, PT, and DP pulses are 2+ bilaterally Skin: Warm and dry without rash. Psych: Normal mood and affect.  EKG: Sinus bradycardia with first-degree AV block (PR interval 148 ms) frequent PVCs, and left anterior fascicular block.  Lab Results  Component Value Date   WBC 4.8 01/28/2022    HGB 14.3 01/28/2022   HCT 41.3 01/28/2022   MCV 93.4 01/28/2022   PLT 152 01/28/2022    Lab Results  Component Value Date   NA 137 01/28/2022   K 4.6 01/28/2022   CL 104 01/28/2022   CO2 28 01/28/2022   BUN 16 01/28/2022   CREATININE 0.99 01/28/2022   GLUCOSE 100 (H) 01/28/2022   ALT 20 01/28/2022    Lab Results  Component Value Date   CHOL 176 02/04/2021   HDL 36 (L) 02/04/2021   LDLCALC 123 (H) 02/04/2021   LDLDIRECT 152.2 06/12/2012   TRIG 87 02/04/2021   CHOLHDL 4.9 02/04/2021    --------------------------------------------------------------------------------------------------  ASSESSMENT AND PLAN: Chronic HFpEF and MGUS: [name] denies shortness of breath but has some limitations in his functional capacity consistent with NYHA class II-III heart failure.  Advanced age and deconditioning could certainly be contributing to this as well.  I have personally reviewed his recent echocardiogram, which demonstrates normal LVEF with at least moderate LVH and grade 2 diastolic dysfunction.  His limb lead voltages are borderline low on today's EKG, though his precordial voltage is normal.  Given his diagnosis  of MGUS coupled with echo findings of diastolic dysfunction, LVH, and moderate pulmonary hypertension and bilateral carpal tunnel syndrome, I am concerned for amyloidosis.  We have discussed further workup options, weighing the risks and benefits given his advanced age.  I will reach out to Dr. Gardiner Rhyme to inquire about the visibility of performing a stress MRI and also evaluating for infiltrative processes such as amyloidosis at the same time.  Given that [name] appears euvolemic on exam, we will defer adding a diuretic today, though we will need to consider this in the future, particularly if his exertional dyspnea worsens.  Frequent PVCs: Multiple BCCs noted on EKG today, albeit asymptomatic.  These could be related to underlying cardiomyopathy or ischemic heart  disease given [name]'s history of hypertension, hyperlipidemia, and TIA.  As above, we discussed further workup options and will inquire with our imaging team about the feasibility of a stress MRI to exclude ischemic heart disease and infiltrative cardiomyopathy.  If this cannot be performed, pharmacologic MPI would need to be considered instead.  I will defer adding a beta-blocker in the setting of modest first-degree AV block.  Hypertension: Blood pressure well-controlled today off pharmacotherapy.  No intervention at this time.  Follow-up: Return to clinic in 2 months.  Nelva Bush, MD 04/15/2022 3:41 PM

## 2022-04-16 ENCOUNTER — Encounter: Payer: Self-pay | Admitting: Internal Medicine

## 2022-04-16 DIAGNOSIS — I493 Ventricular premature depolarization: Secondary | ICD-10-CM | POA: Insufficient documentation

## 2022-04-16 DIAGNOSIS — I5032 Chronic diastolic (congestive) heart failure: Secondary | ICD-10-CM | POA: Insufficient documentation

## 2022-04-16 IMAGING — CT CT HEAD W/O CM
3 of 4 series · 13 of 47 positions shown, 15 images · non-contrast
Comparison: None.

CLINICAL DATA: Short-term memory loss

EXAM:
CT HEAD WITHOUT CONTRAST
TECHNIQUE: Contiguous axial images were obtained from the base of the skull
through the vertex without intravenous contrast.

[Series 2: axial st head 5.00 ax · axial · 0.31mm/px · z∈[-548,-441]mm · 7 of 30 slices shown, 9 images]
[im 4/30  brain]
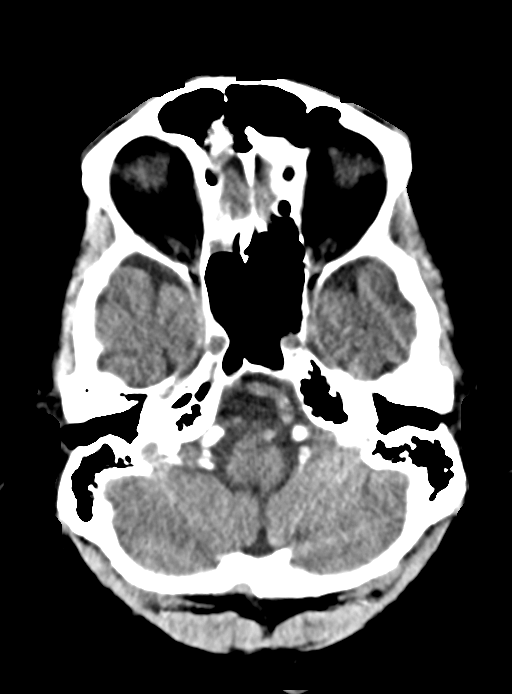
[im 4/30  bone]
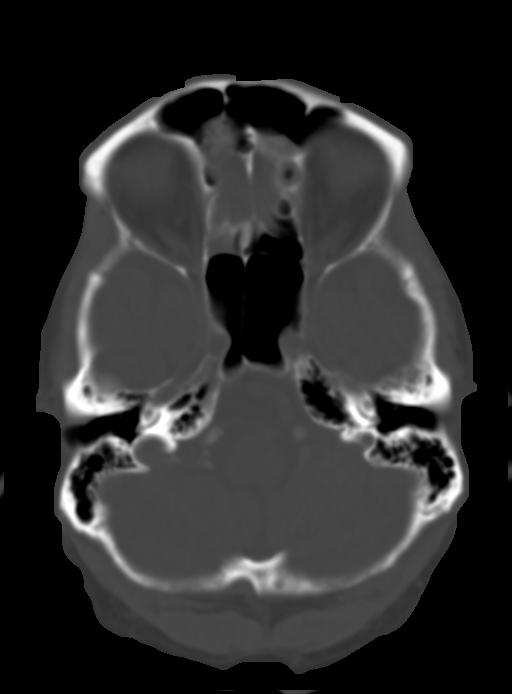
[im 8/30  brain]
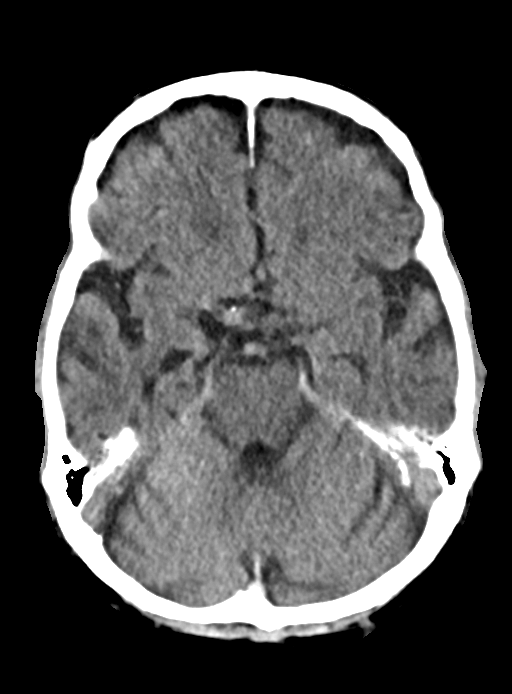
[im 11/30  brain]
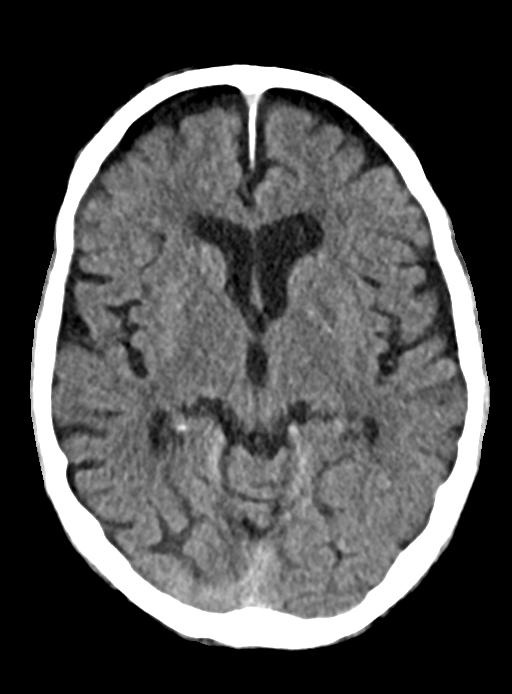
[im 15/30  brain]
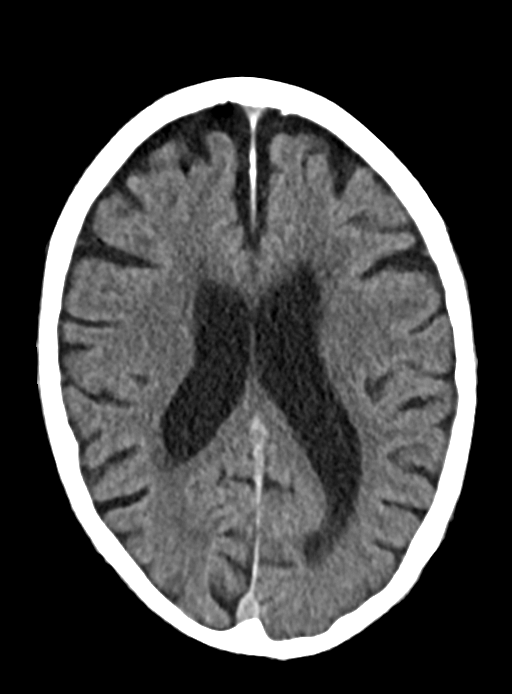
[im 19/30  brain]
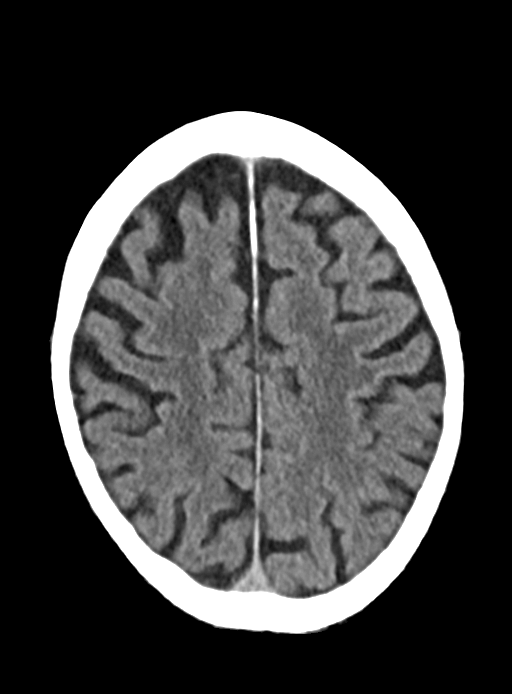
[im 19/30  bone]
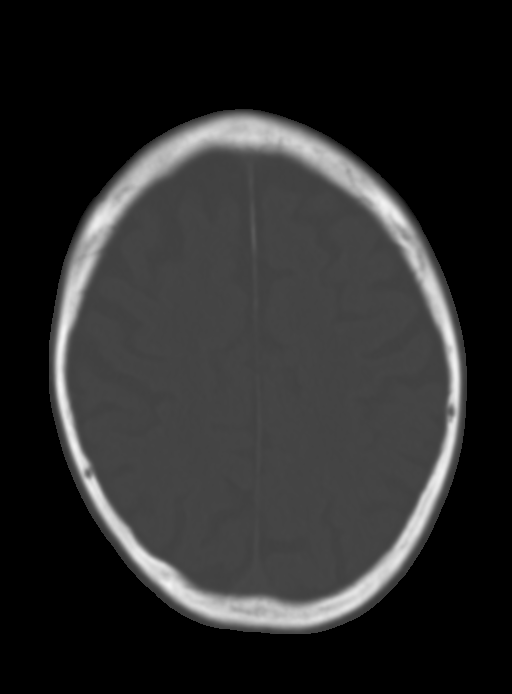
[im 22/30  brain]
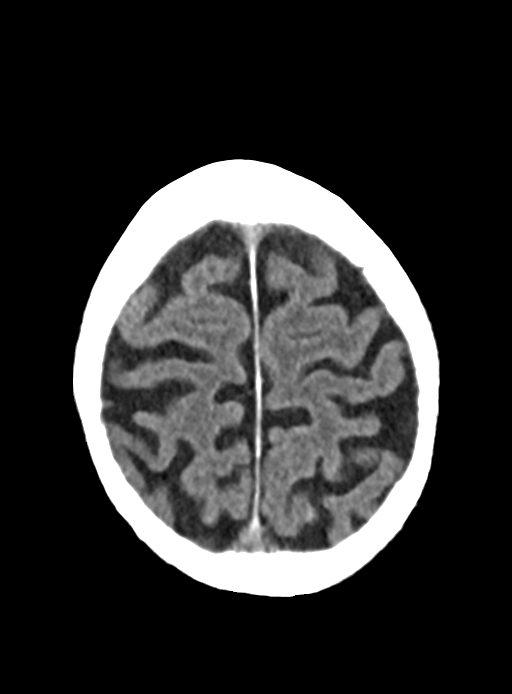
[im 26/30  brain]
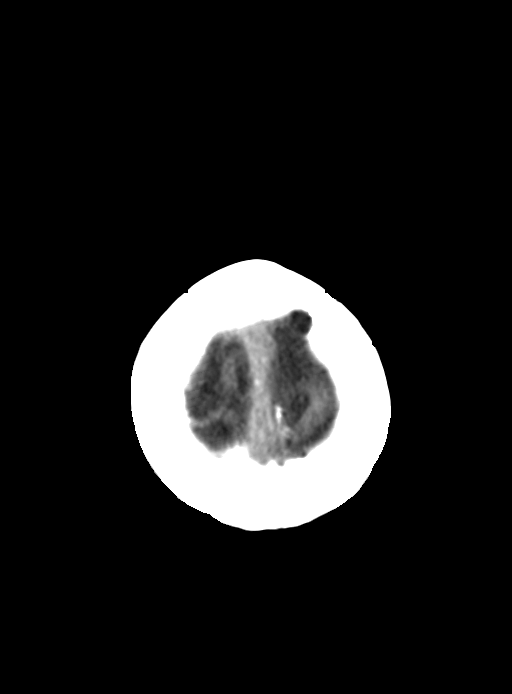

[Series 6: coronals head 3.00 cor · coronal · 0.29mm/px · 3 of 70 slices shown]
[im 24/70  brain]
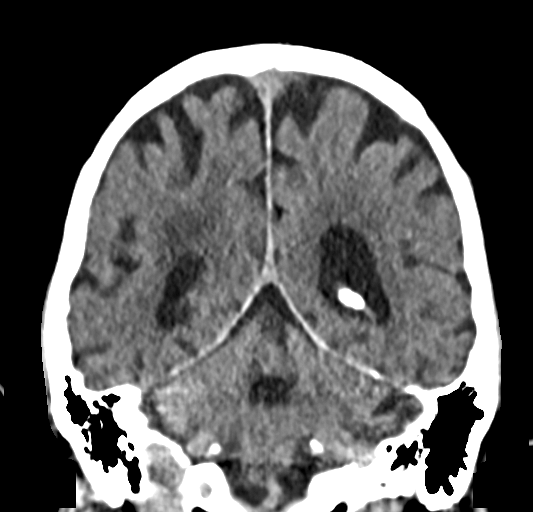
[im 31/70  brain]
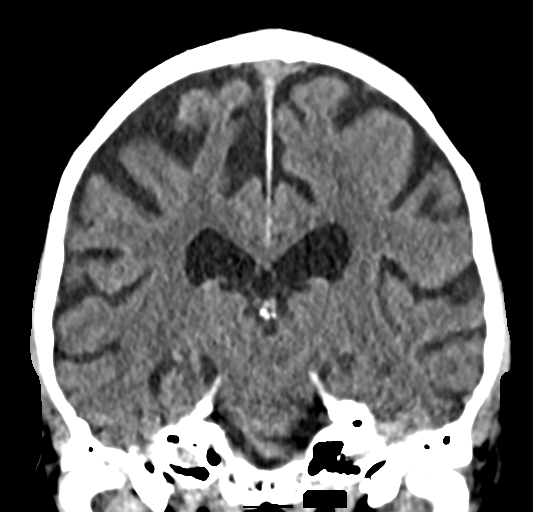
[im 39/70  brain]
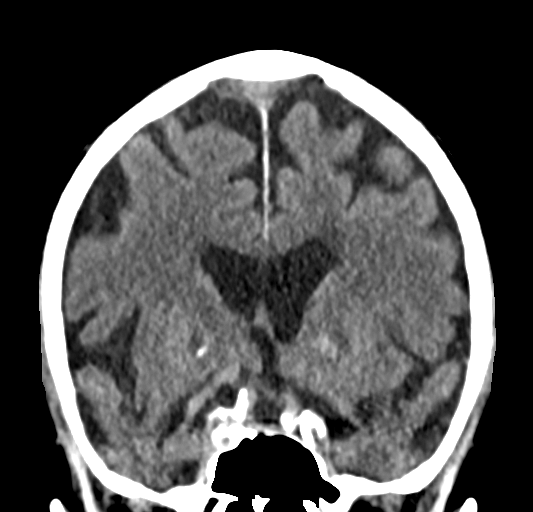

[Series 8: sagittals head 3.00 sag · sagittal · 0.29mm/px · 3 of 52 slices shown]
[im 18/52  brain]
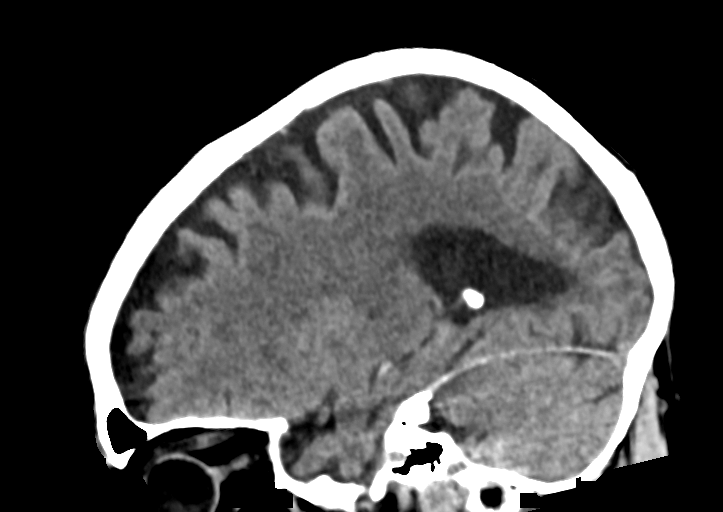
[im 26/52  brain]
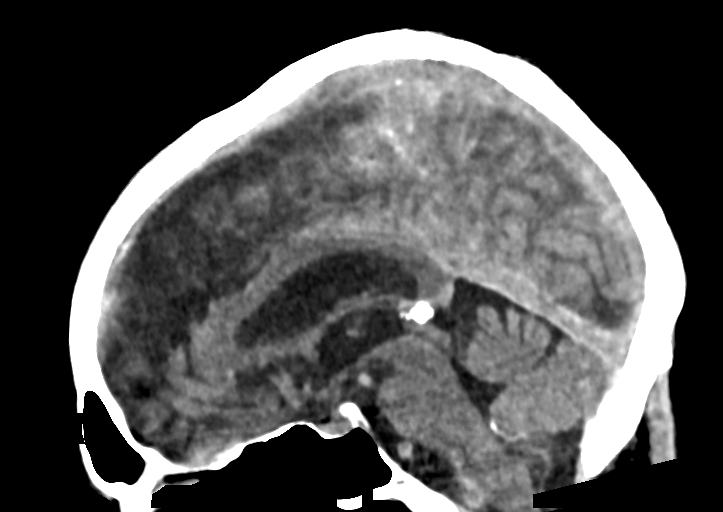
[im 35/52  brain]
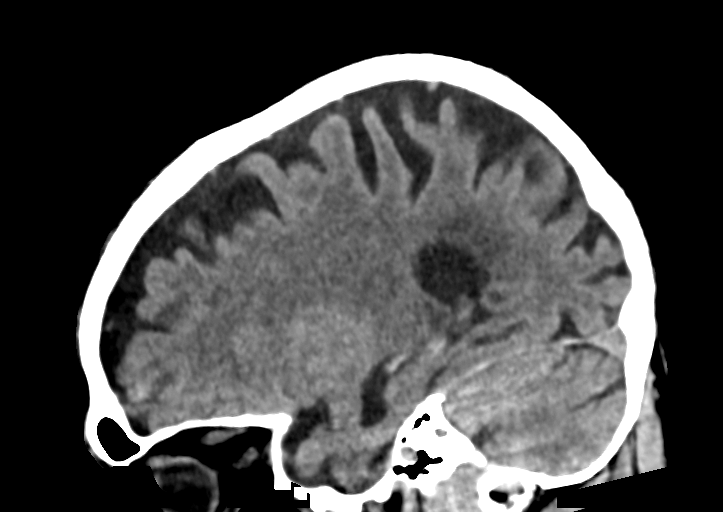

[13 of 47 positions shown; findings below may reference images not displayed]

FINDINGS: Brain: Normal anatomic configuration. No evidence of acute
intracranial hemorrhage or infarct. No abnormal mass effect or
midline shift. No abnormal intra or extra-axial mass lesion or fluid
collection. Mild parenchymal volume loss is present though cerebral
volume appears well preserved for the patient's age. Mild
periventricular white matter changes, likely reflecting the sequela
of small vessel ischemia, are stable since prior examination.

Vascular: Moderate atherosclerotic calcification within the carotid
siphons. No asymmetric hyperdense vasculature at the skull base.

Skull: Normal. Negative for fracture or focal lesion.

Sinuses/Orbits: There is opacification of several ethmoid air cells
bilaterally as well as small amount of layering mucus within the
left frontal sinus. Ocular lenses have been removed. Orbits are
otherwise unremarkable.

Other: Mastoid air cells and middle ear cavities are clear.
IMPRESSION: No acute intracranial hemorrhage or infarct. Stable senescent
change.

Mild bilateral paranasal sinus disease.

## 2022-04-19 ENCOUNTER — Telehealth: Payer: Self-pay

## 2022-04-19 DIAGNOSIS — R0602 Shortness of breath: Secondary | ICD-10-CM

## 2022-04-19 DIAGNOSIS — Z01812 Encounter for preprocedural laboratory examination: Secondary | ICD-10-CM

## 2022-04-19 DIAGNOSIS — I493 Ventricular premature depolarization: Secondary | ICD-10-CM

## 2022-04-19 DIAGNOSIS — E859 Amyloidosis, unspecified: Secondary | ICD-10-CM

## 2022-04-19 NOTE — Telephone Encounter (Signed)
Called pt to inform Dr. Saunders Revel would like to order a Stress Cardiac MRI. Left message to call back.

## 2022-04-19 NOTE — Telephone Encounter (Signed)
Pt's wife made aware and advised pt will need lab work prior to test. Wife verbalized understanding.

## 2022-04-19 NOTE — Telephone Encounter (Signed)
Pt returning call

## 2022-04-21 ENCOUNTER — Encounter (HOSPITAL_COMMUNITY): Payer: Self-pay

## 2022-04-21 ENCOUNTER — Other Ambulatory Visit (HOSPITAL_BASED_OUTPATIENT_CLINIC_OR_DEPARTMENT_OTHER): Payer: Medicare Other | Admitting: Emergency Medicine

## 2022-04-21 ENCOUNTER — Encounter (HOSPITAL_COMMUNITY): Payer: Self-pay | Admitting: Internal Medicine

## 2022-04-21 DIAGNOSIS — I493 Ventricular premature depolarization: Secondary | ICD-10-CM

## 2022-04-22 ENCOUNTER — Ambulatory Visit (INDEPENDENT_AMBULATORY_CARE_PROVIDER_SITE_OTHER): Payer: Medicare Other | Admitting: Neurology

## 2022-04-22 ENCOUNTER — Encounter: Payer: Self-pay | Admitting: Neurology

## 2022-04-22 VITALS — BP 148/61 | HR 55 | Ht 69.0 in | Wt 134.8 lb

## 2022-04-22 DIAGNOSIS — R4189 Other symptoms and signs involving cognitive functions and awareness: Secondary | ICD-10-CM | POA: Diagnosis not present

## 2022-04-22 DIAGNOSIS — G309 Alzheimer's disease, unspecified: Secondary | ICD-10-CM

## 2022-04-22 DIAGNOSIS — E519 Thiamine deficiency, unspecified: Secondary | ICD-10-CM | POA: Diagnosis not present

## 2022-04-22 NOTE — Patient Instructions (Signed)
Check bloodwork, urine, MRI brain Will see what results show and then decide on next steps
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
## 2022-04-23 ENCOUNTER — Telehealth: Payer: Self-pay | Admitting: Neurology

## 2022-04-23 NOTE — Telephone Encounter (Signed)
Sent to [facility] [employer] for [employer]

## 2022-04-25 ENCOUNTER — Telehealth: Payer: Self-pay | Admitting: Neurology

## 2022-04-25 NOTE — Telephone Encounter (Addendum)
Patient's labs came bac unremarkable even his urine. I can;t blame his decline on a UTI or infection. I ordered an MRI brain should be done in a few weeks can you schedule a follow up with me for an hour in January please?Also lt them know I would like him tohave formal memory testing with Dr. Nicole Kindred and he will be called, please expkain what that is to them, some more extensive testing like what we did in the office but more extensive. thanks

## 2022-04-26 ENCOUNTER — Telehealth: Payer: Self-pay | Admitting: Neurology

## 2022-04-26 NOTE — Telephone Encounter (Signed)
LMVM for wife of pt to return call for lab results and referral to Neuropsych Dr. Jaynee Eagles recommended.

## 2022-04-26 NOTE — Telephone Encounter (Signed)
Pt wife is returning a call from nurse. Requesting a call-back.

## 2022-04-26 NOTE — Telephone Encounter (Addendum)
I called pts wife.  Made appt for 06-01-2022 at 1130 (1 hour) for appt per Dr. Jaynee Eagles.  Wife will check with pt about neuropsych eval and let us know.  (Not sure about [place] location as live in [place], ).  Labs unremarkable, no UTI/infection to account for decline.  Mri ordered.  Wife verbalized understanding.

## 2022-04-26 NOTE — Telephone Encounter (Signed)
Referral sent to Dr. Renella Cunas. Vikki Ports in [facility], phone # [phone].

## 2022-04-27 LAB — COMPREHENSIVE METABOLIC PANEL
ALT: 16 IU/L (ref 0–44)
AST: 25 IU/L (ref 0–40)
Albumin/Globulin Ratio: 1.7 (ref 1.2–2.2)
Albumin: 4.7 g/dL — ABNORMAL HIGH (ref 3.6–4.6)
Alkaline Phosphatase: 90 IU/L (ref 44–121)
BUN/Creatinine Ratio: 13 (ref 10–24)
BUN: 13 mg/dL (ref 10–36)
Bilirubin Total: 1.5 mg/dL — ABNORMAL HIGH (ref 0.0–1.2)
CO2: 25 mmol/L (ref 20–29)
Calcium: 9.6 mg/dL (ref 8.6–10.2)
Chloride: 98 mmol/L (ref 96–106)
Creatinine, Ser: 1 mg/dL (ref 0.76–1.27)
Globulin, Total: 2.7 g/dL (ref 1.5–4.5)
Glucose: 96 mg/dL (ref 70–99)
Potassium: 5 mmol/L (ref 3.5–5.2)
Sodium: 137 mmol/L (ref 134–144)
Total Protein: 7.4 g/dL (ref 6.0–8.5)
eGFR: 69 mL/min/{1.73_m2} (ref >59)

## 2022-04-27 LAB — CBC WITH DIFFERENTIAL/PLATELET
Basophils Absolute: 0 10*3/uL (ref 0.0–0.2)
Basos: 0 %
EOS (ABSOLUTE): 0.2 10*3/uL (ref 0.0–0.4)
Eos: 3 %
Hematocrit: 42.8 % (ref 37.5–51.0)
Hemoglobin: 15 g/dL (ref 13.0–17.7)
Immature Grans (Abs): 0 10*3/uL (ref 0.0–0.1)
Immature Granulocytes: 0 %
Lymphocytes Absolute: 2.2 10*3/uL (ref 0.7–3.1)
Lymphs: 35 %
MCH: 33.3 pg — ABNORMAL HIGH (ref 26.6–33.0)
MCHC: 35 g/dL (ref 31.5–35.7)
MCV: 95 fL (ref 79–97)
Monocytes Absolute: 0.7 10*3/uL (ref 0.1–0.9)
Monocytes: 11 %
Neutrophils Absolute: 3.2 10*3/uL (ref 1.4–7.0)
Neutrophils: 51 %
Platelets: 193 10*3/uL (ref 150–450)
RBC: 4.5 x10E6/uL (ref 4.14–5.80)
RDW: 12.2 % (ref 11.6–15.4)
WBC: 6.2 10*3/uL (ref 3.4–10.8)

## 2022-04-27 LAB — URINE CULTURE

## 2022-04-27 LAB — URINALYSIS, ROUTINE W REFLEX MICROSCOPIC
Bilirubin, UA: NEGATIVE
Glucose, UA: NEGATIVE
Ketones, UA: NEGATIVE
Leukocytes,UA: NEGATIVE
Nitrite, UA: NEGATIVE
Protein,UA: NEGATIVE
RBC, UA: NEGATIVE
Specific Gravity, UA: 1.013 (ref 1.005–1.030)
Urobilinogen, Ur: 1 mg/dL (ref 0.2–1.0)
pH, UA: 6 (ref 5.0–7.5)

## 2022-04-27 LAB — AMMONIA: Ammonia: 39 ug/dL (ref 28–135)

## 2022-04-27 LAB — VITAMIN B1: Thiamine: 150.8 nmol/L (ref 66.5–200.0)

## 2022-05-03 ENCOUNTER — Encounter: Payer: Self-pay | Admitting: Family Medicine

## 2022-05-06 ENCOUNTER — Telehealth: Payer: Self-pay | Admitting: Family Medicine

## 2022-05-06 DIAGNOSIS — L989 Disorder of the skin and subcutaneous tissue, unspecified: Secondary | ICD-10-CM

## 2022-05-06 NOTE — Telephone Encounter (Signed)
See mychart message. Referral placed.

## 2022-05-12 ENCOUNTER — Ambulatory Visit (HOSPITAL_COMMUNITY)
Admission: RE | Admit: 2022-05-12 | Discharge: 2022-05-12 | Disposition: A | Discharge status: Home / Self Care | Payer: Medicare Other | Source: Ambulatory Visit | Attending: Internal Medicine | Admitting: Internal Medicine

## 2022-05-12 ENCOUNTER — Ambulatory Visit (HOSPITAL_COMMUNITY): Admission: RE | Admit: 2022-05-12 | Payer: Medicare Other | Source: Ambulatory Visit

## 2022-05-12 DIAGNOSIS — I493 Ventricular premature depolarization: Secondary | ICD-10-CM | POA: Insufficient documentation

## 2022-05-19 ENCOUNTER — Other Ambulatory Visit (HOSPITAL_COMMUNITY): Payer: Self-pay | Admitting: *Deleted

## 2022-05-19 ENCOUNTER — Encounter (HOSPITAL_COMMUNITY): Payer: Self-pay

## 2022-05-19 DIAGNOSIS — Z01810 Encounter for preprocedural cardiovascular examination: Secondary | ICD-10-CM

## 2022-05-20 ENCOUNTER — Telehealth (HOSPITAL_COMMUNITY): Payer: Self-pay | Admitting: *Deleted

## 2022-05-20 NOTE — Telephone Encounter (Signed)
Attempted to call patient regarding upcoming cardiac MRI appointment. Left message on voicemail with name and callback number  July Nickson RN Navigator Cardiac Imaging  Heart and Vascular Services [phone] Office [phone] Cell  

## 2022-05-21 ENCOUNTER — Telehealth (HOSPITAL_COMMUNITY): Payer: Self-pay | Admitting: *Deleted

## 2022-05-21 NOTE — Telephone Encounter (Signed)
Reaching out to patient to offer assistance regarding upcoming cardiac imaging study; pt's wife answered phone with patient and verbalizes understanding of appt date/time, parking situation and where to check in, and verified current allergies; name and call back number provided for further questions should they arise  Gordy Clement RN [facility] [phone] office [phone] cell  Patient denies metal or claustrophobia.

## 2022-05-24 ENCOUNTER — Other Ambulatory Visit: Payer: Self-pay | Admitting: Internal Medicine

## 2022-05-24 ENCOUNTER — Ambulatory Visit (HOSPITAL_COMMUNITY)
Admission: RE | Admit: 2022-05-24 | Discharge: 2022-05-24 | Disposition: A | Discharge status: Home / Self Care | Payer: Medicare Other | Source: Ambulatory Visit | Attending: Internal Medicine | Admitting: Internal Medicine

## 2022-05-24 ENCOUNTER — Other Ambulatory Visit (HOSPITAL_COMMUNITY): Payer: Medicare Other

## 2022-05-24 DIAGNOSIS — I493 Ventricular premature depolarization: Secondary | ICD-10-CM

## 2022-05-24 DIAGNOSIS — R0602 Shortness of breath: Secondary | ICD-10-CM

## 2022-05-24 DIAGNOSIS — E859 Amyloidosis, unspecified: Secondary | ICD-10-CM

## 2022-05-24 MED ORDER — GADOBUTROL 1 MMOL/ML IV SOLN
7.5000 mL | Freq: Once | INTRAVENOUS | Status: AC | PRN
  Administered 2022-05-24: 7.5 mL via INTRAVENOUS

## 2022-05-25 ENCOUNTER — Telehealth: Payer: Self-pay | Admitting: Internal Medicine

## 2022-05-25 ENCOUNTER — Telehealth: Payer: Self-pay

## 2022-05-25 NOTE — Telephone Encounter (Signed)
-----   Message from Earlie Server, MD sent at 05/25/2022  3:32 PM EST ----- He has cardiac MRI done recently which showed cardia amylodosis, please move up his appt with me to this week. MD first, and then labs.  Thank you  zy

## 2022-05-25 NOTE — Telephone Encounter (Signed)
I spoke with Mr. 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  He reports that he has been feeling fairly well without new complaints since our visit in November.  We will continue his current medications and follow-up as planned on 06/18/2022.  I have also forwarded the results to Dr. Tasia Catchings, who will try to move up her next appointment with Mr. Granade to discuss potential treatment options.  Nelva Bush, MD [facility]

## 2022-05-25 NOTE — Telephone Encounter (Signed)
Please move up appt to this week  for MRI result discussion. Ok to DB. please inform pt of appt:   MD first, then labs

## 2022-05-27 ENCOUNTER — Inpatient Hospital Stay: Payer: Medicare Other | Attending: Oncology

## 2022-05-27 ENCOUNTER — Encounter: Payer: Self-pay | Admitting: Oncology

## 2022-05-27 ENCOUNTER — Inpatient Hospital Stay (HOSPITAL_BASED_OUTPATIENT_CLINIC_OR_DEPARTMENT_OTHER): Payer: Medicare Other | Admitting: Oncology

## 2022-05-27 VITALS — BP 139/50 | HR 55 | Temp 96.6°F | Wt 135.4 lb

## 2022-05-27 DIAGNOSIS — Z79899 Other long term (current) drug therapy: Secondary | ICD-10-CM | POA: Diagnosis not present

## 2022-05-27 DIAGNOSIS — D472 Monoclonal gammopathy: Secondary | ICD-10-CM

## 2022-05-27 DIAGNOSIS — Z8042 Family history of malignant neoplasm of prostate: Secondary | ICD-10-CM | POA: Diagnosis not present

## 2022-05-27 DIAGNOSIS — I43 Cardiomyopathy in diseases classified elsewhere: Secondary | ICD-10-CM | POA: Insufficient documentation

## 2022-05-27 DIAGNOSIS — E854 Organ-limited amyloidosis: Secondary | ICD-10-CM | POA: Insufficient documentation

## 2022-05-27 DIAGNOSIS — Z7982 Long term (current) use of aspirin: Secondary | ICD-10-CM | POA: Insufficient documentation

## 2022-05-27 DIAGNOSIS — Z87891 Personal history of nicotine dependence: Secondary | ICD-10-CM | POA: Insufficient documentation

## 2022-05-27 DIAGNOSIS — I10 Essential (primary) hypertension: Secondary | ICD-10-CM | POA: Diagnosis not present

## 2022-05-27 NOTE — Progress Notes (Signed)
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 There is concern about underlying amyloidosis. I had a lengthy discussion with patient, his wife, also her daughter over the phone. Discussed about option of proceeding with bone marrow biopsy/Congo red staining to confirm amyloidosis diagnosis.  Given his advanced age, if he is interested in treatment, I will consider monotherapy Daratumumab. Patient is reluctant in taking receiving chemotherapy treatments.  He and the family are not interested in bone marrow biopsy given that most likely patient will not take chemotherapy.  Given that he is currently asymptomatic, denies any constitutional symptoms, minimal cardiac symptoms, I think observation is reasonable.   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 Saunders Byrd for medication optimization.  No orders of the defined types were placed in this encounter.  Follow-up in 4 months. All questions were answered. The patient knows to call the clinic with any problems, questions or concerns.  Ronald Server, MD, PhD [facility] Hematology Oncology 05/27/2022   CHIEF COMPLAINTS/REASON FOR VISIT:  Follow up for MGUS, possible amyloidosis  HISTORY OF PRESENTING ILLNESS:   [name] is a  89 y.o.  male with PMH listed below was seen in consultation at the request of  Ronald Ghent, MD  for evaluation of monoclonal gammoathy.   Patient is accompanied by her daughter. 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   He sees Neurology [name] for neuropathy. EMG confirmed carpal tunnel manifesting as tingling and numbness in hands bilaterally with atrophy of APB in left hand.  As part of the work up, SPEP showed M spike of 0.3, IFE showed IgA lamda monoclonal protein. Patient was referred to hematology for further evaluation.   Denies any previous cardiovascular disease, kidney disease.   INTERVAL HISTORY [name] is a 89 y.o. male who has above history reviewed by me today presents for follow up visit for MGUS He is accompanied by his wife.  NT proBNP is elevated.  Echocardiogram is consistent with diastolic dysfunction. 05/24/2022, cardiac MRI shows findings consistent with cardiac amyloidosis, including severe asymmetric LV hypertrophy, markedly elevated extracellular volume, and diffuse late gadolinium enhancement.  Normal LVEF 56%, normal RV size with mild systolic dysfunction.  Dilated ascending aorta measuring 41 mm. Patient denies any chest pain, shortness of breath, lower extremity swelling. Denies any unintentional weight loss, night sweats or fever.  Review of Systems  Constitutional:  Negative for appetite change, chills, fatigue, fever and unexpected weight change.  HENT:   Negative for hearing loss and voice change.   Eyes:  Negative for eye problems and icterus.  Respiratory:  Negative for chest tightness, cough and shortness of breath.   Cardiovascular:  Negative for chest pain and leg swelling.  Gastrointestinal:  Negative for abdominal distention and abdominal pain.  Endocrine: Negative for hot flashes.  Genitourinary:  Negative for difficulty urinating, dysuria and frequency.   Musculoskeletal:  Negative for arthralgias.  Skin:  Negative for itching and rash.  Neurological:  Positive for numbness. Negative for light-headedness.  Hematological:  Negative for adenopathy. Does not bruise/bleed easily.  Psychiatric/Behavioral:  Negative for confusion.     MEDICAL HISTORY:  Past  Medical History:  Diagnosis Date   BPH (benign prostatic hyperplasia) 06/1997   GERD (gastroesophageal reflux disease)    schatzki ring dilated, [facility] (Dr. Uvaldo Rising)   Hyperlipemia 1990's   Hypertension 02/2004   TIA (transient ischemic attack)    was not on aspirin at time of TIA per patient    SURGICAL HISTORY: Past Surgical History:  Procedure Laterality Date   APPENDECTOMY  1947   bilateral hernia repair  1965   CHOLECYSTECTOMY  07/2003   DG CHEST FOR [employer] EMPLOYEE  06/02-08/2006   TIA, Dyslipidemia, HTN, Right Sided Carotid Artery Stenosis 40-60%   ESOPHAGOGASTRODUODENOSCOPY  03/23/2001   schatzki ring dilated, [facility] (Dr. Uvaldo Rising)   ESOPHAGOGASTRODUODENOSCOPY  06/13/2012   Procedure: ESOPHAGOGASTRODUODENOSCOPY (EGD);  Surgeon: Jerene Bears, MD;  Location: [facility];  Service: Gastroenterology;  Laterality: N/A;   holter monitor  10/17/2001   ISOL, PVC's, SVC's   MRI Brain     nml, MRA brain, intracran athersc dz MCA RPCA 10/18/06, Head CT NML 10/17/06   MRI Cerv Spine  10/18/2006   mild degen changes    SOCIAL HISTORY: Social History   Socioeconomic History   Marital status: Married    Spouse name: Not on file   Number of children: Not on file   Years of education: Not on file   Highest education level: Not on file  Occupational History   Occupation: Retired Administrator, 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)   Social Determinants of Health   Financial Resource Strain: Low Risk  (03/04/2022)   Overall Financial Resource Strain (CARDIA)    Difficulty of Paying Living Expenses: Not hard at all  Food Insecurity: No Food Insecurity (03/04/2022)   Hunger Vital Sign    Worried About Running Out of Food in the Last Year: Never true    [place] in the Last Year: Never true  Transportation Needs: No Transportation Needs (03/04/2022)   PRAPARE - Hydrologist (Medical): No    Lack of Transportation (Non-Medical): No  Physical Activity: Inactive (03/04/2022)   Exercise Vital Sign    Days of Exercise per Week: 0 days    Minutes of Exercise per Session: 0 min  Stress: No Stress Concern Present (03/04/2022)   [place]    Feeling of Stress : Not at all  Social Connections: Unknown (03/04/2022)   Social Connection and Isolation Panel [NHANES]    Frequency of Communication with Friends and Family: Not on file    Frequency of Social Gatherings with Friends and Family:  Not on file    Attends Religious Services: Not on file    Active Member of Clubs or Organizations: Not on file    Attends Club or Organization Meetings: Not on file    Marital Status: Married  Intimate Partner Violence: Not At Risk (03/04/2022)   Humiliation, Afraid, Rape, and Kick questionnaire    Fear of Current or Ex-Partner: No    Emotionally Abused: No    Physically Abused: No    Sexually Abused: No    FAMILY HISTORY: Family History  Problem Relation Age of Onset   Stroke Mother    Diabetes Sister    Heart disease Sister        CAD, AFib, GI bleed, FE deficiency   Dementia Sister    Prostate cancer Brother    Cancer Brother 58       lungs, smoker   Colon cancer Neg Hx     ALLERGIES:  is allergic to atorvastatin, ezetimibe-simvastatin, sildenafil, and  simvastatin.  MEDICATIONS:  Current Outpatient Medications  Medication Sig Dispense Refill   aspirin 81 MG tablet Take 81 mg by mouth daily.     Cyanocobalamin (VITAMIN B12) 1000 MCG TBCR Take 1 tablet by mouth daily.     loratadine (CLARITIN) 10 MG tablet TAKE 1 TABLET BY MOUTH ONCE DAILY 90 tablet 3   pantoprazole (PROTONIX) 40 MG tablet TAKE 1 TABLET BY MOUTH ONCE DAILY 90 tablet 3   VITAMIN D PO Take 1 tablet by mouth daily.     No current facility-administered medications for this visit.     PHYSICAL EXAMINATION: ECOG PERFORMANCE STATUS: 1 - Symptomatic but completely ambulatory Vitals:   05/27/22 0943  BP: (!) 139/50  Pulse: (!) 55  Temp: (!) 96.6 F (35.9 C)  SpO2: 95%   Filed Weights   05/27/22 0943  Weight: 135 lb 6.4 oz (61.4 kg)    Physical Exam Constitutional:      General: He is not in acute distress.    Comments: Patient walks independantly  HENT:     Head: Normocephalic and atraumatic.  Eyes:     General: No scleral icterus. Cardiovascular:     Rate and Rhythm: Normal rate and regular rhythm.     Heart sounds: Normal heart sounds.  Pulmonary:     Effort: Pulmonary effort is normal. No respiratory distress.     Breath sounds: No wheezing.  Abdominal:     General: Bowel sounds are normal. There is no distension.     Palpations: Abdomen is soft.  Musculoskeletal:        General: No deformity. Normal range of motion.     Cervical back: Normal range of motion and neck supple.  Skin:    General: Skin is warm and dry.     Findings: No erythema or rash.  Neurological:     Mental Status: He is alert and oriented to person, place, and time. Mental status is at baseline.     Cranial Nerves: No cranial nerve deficit.     Coordination: Coordination normal.  Psychiatric:        Mood and Affect: Mood normal.     LABORATORY DATA:  I have reviewed the data as listed    Latest Ref Rng & Units 04/22/2022   12:07 PM 01/28/2022   10:55 AM 07/28/2021   11:40  AM  CBC  WBC 3.4 - 10.8 x10E3/uL 6.2  4.8  5.9   Hemoglobin 13.0 - 17.7 g/dL 15.0  14.3  14.5  Hematocrit 37.5 - 51.0 % 42.8  41.3  41.5   Platelets 150 - 450 x10E3/uL 193  152  159       Latest Ref Rng & Units 04/22/2022   12:07 PM 01/28/2022   10:55 AM 07/28/2021   11:40 AM  CMP  Glucose 70 - 99 mg/dL 96  100  113   BUN 10 - 36 mg/dL '13  16  18   '$ Creatinine 0.76 - 1.27 mg/dL 1.00  0.99  0.91   Sodium 134 - 144 mmol/L 137  137  134   Potassium 3.5 - 5.2 mmol/L 5.0  4.6  4.3   Chloride 96 - 106 mmol/L 98  104  102   CO2 20 - 29 mmol/L '25  28  25   '$ Calcium 8.6 - 10.2 mg/dL 9.6  9.2  9.2   Total Protein 6.0 - 8.5 g/dL 7.4  7.1  7.2   Total Bilirubin 0.0 - 1.2 mg/dL 1.5  2.4  1.9   Alkaline Phos 44 - 121 IU/L 90  63  61   AST 0 - 40 IU/L '25  28  26   '$ ALT 0 - 44 IU/L '16  20  17      '$ RADIOGRAPHIC STUDIES: I have personally reviewed the radiological images as listed and agreed with the findings in the report. MR CARDIAC MORPHOLOGY W WO CONTRAST  Result Date: 05/24/2022 CLINICAL DATA:  19M with MGUS, chronic HFpEF EXAM: CARDIAC MRI TECHNIQUE: The patient was scanned on a 1.5 Tesla Siemens magnet. A dedicated cardiac coil was used. Functional imaging was done using Fiesta sequences. 2,3, and 4 chamber views were done to assess for RWMA's. Modified Simpson's rule using a short axis stack was used to calculate an ejection fraction on a dedicated work Conservation officer, nature. The patient received 7.5 cc of Gadavist. After 10 minutes inversion recovery sequences were used to assess for infiltration and scar tissue. Phase contrast velocity mapping was performed CONTRAST:  7.5 cc  of Gadavist FINDINGS: Left ventricle: -Severe asymmetric hypertrophy measuring up to 36m in basal septum (131min posterior wall) -Normal size -Normal systolic function -Significantly elevated ECV (45%) -Diffuse LGE LV EF: 56% (Normal 49-79%) Absolute volumes: LV EDV: 10269mNormal 95-215 mL) LV ESV: 3m10mormal  25-85 mL) LV SV: 57mL55mrmal 61-145 mL) CO: 2.6L/min (Normal 3.4-7.8 L/min) Indexed volumes: LV EDV: 59mL/6m (Normal 50-108 mL/sq-m) LV ESV: 26mL/s83m(Normal 11-47 mL/sq-m) LV SV: 33mL/sq48mNormal 33-72 mL/sq-m) CI: 1.5L/min/sq-m (Normal 1.8-4.2 L/min/sq-m) Right ventricle: Normal size with mild systolic dysfunction RV EF:  43% (Normal 51-80%) Absolute volumes: RV EDV: 123mL (No61m 109-217 mL) RV ESV: 70mL (Nor34m23-91 mL) RV SV: 53mL (Norm63m1-141 mL) CO: 2.4L/min (Normal 2.8-8.8 L/min) Indexed volumes: RV EDV: 71mL/sq-m (7mal 58-109 mL/sq-m) RV ESV: 41mL/sq-m (N3ml 12-46 mL/sq-m) RV SV: 31mL/sq-m (No63m 38-71 mL/sq-m) CI: 1.4L/min/sq-m (Normal 1.7-4.2 L/min/sq-m) Left atrium: Moderate enlargement Right atrium: Moderate enlargement Mitral valve: Mild regurgitation visually, was not quantified due to poor quality phase velocity mapping due to frequent PVCs Aortic valve: Mild regurgitation visually Tricuspid valve: Mild regurgitation visually Pulmonic valve: Mild regurgitation visually Aorta: Dilated ascending aorta measuring 41mm Pericardi53mNormal IMPRESSION: 1. Findings consistent with cardiac amyloidosis, including severe asymmetric LV hypertrophy, markedly elevated extracellular volume (45%), and diffuse late gadolinium enhancement 2.  Normal LV size and systolic function (EF 56%) 3.  Normal94% size with mild systolic dysfunction (EF 43%) 4.  Dilate70%scending aorta measuring 41mm Electronic53m Signed   By: ChristopherHarrell Byrd  Gardiner Rhyme M.D.   On: 05/24/2022 22:24

## 2022-05-27 NOTE — Assessment & Plan Note (Addendum)
Recommend patient to continue follow-up with Dr. Saunders Revel for medication optimization.

## 2022-05-27 NOTE — Assessment & Plan Note (Signed)
IgA lambda MGUS Cardiac MRI results were reviewed and discussed with patient. There is concern about underlying amyloidosis. I had a lengthy discussion with patient, his wife, also her daughter over the phone. Discussed about option of proceeding with bone marrow biopsy/[place] red staining to confirm amyloidosis diagnosis.  Given his advanced age, if he is interested in treatment, I will consider monotherapy Daratumumab. Patient is reluctant in taking receiving chemotherapy treatments.  He and the family are not interested in bone marrow biopsy given that most likely patient will not take chemotherapy.  Given that he is currently asymptomatic, denies any constitutional symptoms, minimal cardiac symptoms, I think observation is reasonable.

## 2022-06-01 ENCOUNTER — Ambulatory Visit (INDEPENDENT_AMBULATORY_CARE_PROVIDER_SITE_OTHER): Payer: Medicare Other | Admitting: Neurology

## 2022-06-01 ENCOUNTER — Encounter: Payer: Self-pay | Admitting: Neurology

## 2022-06-01 VITALS — BP 127/46 | HR 46 | Ht 68.0 in | Wt 137.2 lb

## 2022-06-01 DIAGNOSIS — R4689 Other symptoms and signs involving appearance and behavior: Secondary | ICD-10-CM

## 2022-06-01 DIAGNOSIS — F02818 Dementia in other diseases classified elsewhere, unspecified severity, with other behavioral disturbance: Secondary | ICD-10-CM | POA: Diagnosis not present

## 2022-06-01 DIAGNOSIS — G309 Alzheimer's disease, unspecified: Secondary | ICD-10-CM

## 2022-06-01 DIAGNOSIS — R4189 Other symptoms and signs involving cognitive functions and awareness: Secondary | ICD-10-CM | POA: Diagnosis not present

## 2022-06-01 DIAGNOSIS — R29818 Other symptoms and signs involving the nervous system: Secondary | ICD-10-CM

## 2022-06-01 DIAGNOSIS — R4681 Obsessive-compulsive behavior: Secondary | ICD-10-CM

## 2022-06-01 DIAGNOSIS — G301 Alzheimer's disease with late onset: Secondary | ICD-10-CM

## 2022-06-01 NOTE — Progress Notes (Signed)
[facility]    Provider:  Dr Jaynee Eagles Requesting Provider: Tonia Ghent, MD Primary Care Provider:  Tonia Ghent, MD  CC:  acute cognitive decline  06/01/2022: He did not get the MRI brain. He has been diagnosed with cardiac amyloidosis. Family does not feel the walking or imbalance or falls are issue.  At her last appointment we had a plan for addressing his cognitive decline, we had ordered an MRI of the brain and some neuro cognitive testing to start, he did not get this completed, they have been busy with cardiology and have been recently diagnosed with cardiac amyloidosis.  On prior MRIs and CT scans of his head I do not see any cerebral arterial amyloid however there is an association between amyloidosis and Alzheimer's obviously.  Patient's sister had Alzheimer's.  Companion is still concerned about his memory changes, has not been so bad with his irritation however she still states that he is having agitation and behavioral problems and is obsessed with sex forgetting names and places more short-term memory loss.  We discussed a different plan, he refuses to go back into an MRI and given the recent diagnosis of amyloidosis I think what we can do is an FDG PET scan to distinguish between Alzheimer's or Lewy body dementia, amyloid tau neurodegeneration profile and also test him for April E4 gene.  It is unclear whether this is Alzheimer's or a 89 year old man with physiologic memory loss.  He agreed.  I also discussed starting him on possibly a Zoloft I will reach out to Dr. Damita Dunnings.  04/22/2022: here for new concern cognitive changes. Memory changes starts in 06/2021 but Amelia Jo is an acute decline, over a short period of time he has become agitated, throwing things, obsessed with sex, he won;t drink any fluids at all. Prior to this he started Forgetting names and places, wnow andering from home, increased agitation, threw his wallet at her. I have not seen patient for this in  the past whatsoever, was seen for CTS. This appears to be a significant acute decline from when he last saw Dr. Damita Dunnings. UTI has not been checked. Will check UA and tsh and labs. Seems to be acute pn chronic decline so maybe something medical ongoing. He was lovely when I saw him. His MMSE is actually improved to 23 from a 19 in the past. His sister just oast away from dementia alzheimers. He has lost weight. WILL NOT DRINK WATER. He remembers ecactly what I wrote in my note last February and remembers me.  Patient complains of symptoms per HPI as well as the following symptoms: agitation . Pertinent negatives and positives per HPI. All others negative   HPI 06/2021:  BENEDETTO RYDER is a 89 y.o. male here as requested by Tonia Ghent, MD for tingling and numbness in hands concerning for carpal tunnel syndrome. Testing in 09/2019 showed "Severe Carpal Tunnel Syndrome". It all started years ago, even years before EMG testing in 2021. Just the hands. No neck pain or symptoms in his neck with radicular symptoms but he does feel it in his forarm, severe pain, he is still blessed even with pain. He had injections. Numb and tingling digits 1-3, weakness, atrophy in the thumb, dropping things, pain is between midnight and the time he gets up. Can come during the day. No neck pain. No radicular symptoms. Here with his daughter who also provides much information. No other focal neurologic deficits, associated symptoms, inciting events or modifiable factors.  Reviewed notes, labs and imaging from outside physicians, which showed:  I reviewed emg/ncs performed 2021, data and report which showed: severe carpal tunnel syndrome  CT head 03/09/2021: personally reviewed imagig and agree FINDINGS: Brain: Normal anatomic configuration. No evidence of acute intracranial hemorrhage or infarct. No abnormal mass effect or midline shift. No abnormal intra or extra-axial mass lesion or fluid collection. Mild parenchymal  volume loss is present though cerebral volume appears well preserved for the patient's age. Mild periventricular white matter changes, likely reflecting the sequela of small vessel ischemia, are stable since prior examination.   Vascular: Moderate atherosclerotic calcification within the carotid siphons. No asymmetric hyperdense vasculature at the skull base.   Skull: Normal. Negative for fracture or focal lesion.   Sinuses/Orbits: There is opacification of several ethmoid air cells bilaterally as well as small amount of layering mucus within the left frontal sinus. Ocular lenses have been removed. Orbits are otherwise unremarkable.   Other: Mastoid air cells and middle ear cavities are clear.   IMPRESSION: No acute intracranial hemorrhage or infarct. Stable senescent change.   Mild bilateral paranasal sinus disease.  Review of Systems: Patient complains of symptoms per HPI as well as the following symptoms hand pain. Pertinent negatives and positives per HPI. All others negative.   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)   Social Determinants of Health   Financial Resource Strain: Low Risk  (03/04/2022)   Overall Financial Resource Strain (CARDIA)    Difficulty of Paying Living Expenses: Not hard at all  Food Insecurity: No Food Insecurity (03/04/2022)   Hunger Vital Sign    Worried About Running Out of Food in the Last Year: Never true    [place] in the Last Year: Never true  Transportation Needs: No Transportation Needs (03/04/2022)   PRAPARE - Hydrologist (Medical): No    Lack of Transportation (Non-Medical): No  Physical Activity: Inactive (03/04/2022)   Exercise Vital Sign    Days of Exercise per Week: 0 days    Minutes of Exercise per Session: 0 min  Stress: No Stress Concern Present (03/04/2022)   [place]    Feeling of Stress : Not at all  Social Connections: Unknown (03/04/2022)   Social Connection and Isolation Panel [NHANES]    Frequency of Communication with Friends  and Family: Not on file    Frequency of Social Gatherings with Friends and Family: Not on file    Attends Religious Services: Not on file    Active Member of Clubs or Organizations: Not on file    Attends Club or Organization Meetings: Not on file    Marital Status: Married  Intimate Partner Violence: Not At Risk (03/04/2022)   Humiliation, Afraid, Rape, and Kick questionnaire    Fear of Current or Ex-Partner: No    Emotionally Abused: No    Physically Abused: No    Sexually Abused: No    Family History  Problem Relation Age of Onset   Stroke Mother    Diabetes Sister    Heart disease Sister        CAD, AFib, GI bleed, FE deficiency   Dementia Sister    Prostate cancer Brother    Cancer Brother 28       lungs, smoker   Colon cancer Neg Hx     Past Medical History:  Diagnosis Date   BPH (benign prostatic hyperplasia) 06/1997   GERD (gastroesophageal reflux disease)    schatzki ring dilated, [facility]  (Dr. 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/01/2021   Vision changes 02/04/2021   Memory change 02/04/2021   MGUS (monoclonal gammopathy of unknown significance) 07/07/2020   History of nonmelanoma skin cancer 03/11/2020   Basal cell carcinoma 12/26/2019   Skin tear of elbow without complication 93/81/0175   Leg swelling 12/25/2018   GERD (gastroesophageal reflux disease)    Health care maintenance 01/26/2018   Glossitis 01/26/2018   History of syncope 10/12/2017   Syncope 09/23/2017   Leg pain 01/14/2017   Skin lump of arm 05/29/2015   Rash 12/15/2014   Post-nasal drip 12/15/2014   Hand tingling 12/15/2014   Hyperglycemia 11/14/2013   Medicare annual wellness visit, subsequent 11/09/2013   Paresthesia 07/01/2013   Cough 09/18/2012   Dysphagia 06/13/2012   Schatzki's ring 06/13/2012   Advance care planning 08/06/2011   Carotid stenosis 11/01/2006   HLD (hyperlipidemia) 10/29/2006   Essential hypertension 10/29/2006   BENIGN PROSTATIC HYPERTROPHY 10/29/2006   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  (Dr. Uvaldo Rising)   ESOPHAGOGASTRODUODENOSCOPY  06/13/2012   Procedure: ESOPHAGOGASTRODUODENOSCOPY (EGD);  Surgeon: Jerene Bears, MD;  Location: [facility];  Service: Gastroenterology;  Laterality: N/A;   holter monitor  10/17/2001   ISOL, PVC's, SVC's   MRI Brain     nml, MRA brain, intracran athersc dz MCA RPCA 10/18/06, Head CT NML 10/17/06   MRI Cerv Spine  10/18/2006   mild  degen changes    Current Outpatient Medications  Medication Sig Dispense Refill   aspirin 81 MG tablet Take 81 mg by mouth daily.     Cyanocobalamin (VITAMIN B12) 1000 MCG TBCR Take 1 tablet by mouth daily.     loratadine (CLARITIN) 10 MG tablet TAKE 1 TABLET BY MOUTH ONCE DAILY 90 tablet 3   pantoprazole (PROTONIX) 40 MG tablet TAKE 1 TABLET BY MOUTH ONCE DAILY 90 tablet 3   VITAMIN D PO Take 1 tablet by mouth daily.     No current facility-administered medications for this visit.    Allergies as of 06/01/2022 - Review Complete 06/01/2022  Allergen Reaction Noted   Atorvastatin  10/28/2006   Ezetimibe-simvastatin  10/28/2006   Sildenafil  03/15/2019   Simvastatin  10/28/2006    Vitals: BP (!) 127/46   Pulse (!) 46   Ht '5\' 8"'$  (1.727 m)   Wt 137 lb 3.2 oz (62.2 kg)   BMI 20.86 kg/m  Last Weight:  Wt Readings from Last 1 Encounters:  06/01/22 137 lb 3.2 oz (62.2 kg)   Last Height:   Ht Readings from Last 1 Encounters:  06/01/22 '5\' 8"'$  (1.727 m)    Exam: NAD, pleasant                  Speech:    Speech is normal; fluent and spontaneous with normal comprehension.  Cognition:    06/01/2022   12:11 PM 04/22/2022   11:00 AM 01/23/2018    1:34 PM  MMSE - Mini Mental State Exam  Orientation to time '3 3 5  '$ Orientation to Place '4 5 5  '$ Registration '3 3 3  '$ Attention/ Calculation 5 5 0  Recall 0 1 2  Recall-comments   unable to recall 1 of 3 words  Language- name 2 objects 2 2 0  Language- repeat 0 0 1  Language- follow 3 step command '2 3 3  '$ Language- read & follow direction 1 1 0  Language-read &  follow direction-comments pt had carpal tunnel surgery unsble to draw    Write a sentence  0 0  Write a sentence-comments pt had carpal tunnel surgery unable to write    Copy design  0 0  Copy design-comments pt had carpal tunnel surgery unsble to draw    Total score  23 19      language fluent;    Cranial Nerves:    The pupils are equal, round, and reactive to light.Trigeminal sensation is intact and the muscles of mastication are normal. The face is symmetric. The palate elevates in the midline. Hearing intact. Voice is normal. Shoulder shrug is normal. The tongue has normal motion without fasciculations.   Coordination:  No dysmetria  Motor Observation:    No asymmetry, no atrophy, and no involuntary movements noted. Tone:    Normal muscle tone.     Strength:    Strength is V/V in the upper and lower limbs.      Sensation: intact to LT  Gait: Shuffling, Stooped    Assessment/Plan:  This is a darling 89 year old male who I diagnosed with CTS and he has had surgery. But he is here for cognitive decline. I tested his blood work and urine for anything metabolic/infection and everything looks fine even urine last time. His short term memory seems intact, he remembers me from a few months ago and remembers in detail part of my note I wrote. His MMSE is actually improved from 19 to  23 although still abnormal. He is shuffling, stooped possibly start of lewy body dementia? Just diagnosed with cardiac amyloidosis.  On prior MRIs and CT scans of his head I do not see any cerebral amyloid arteriopathy however there is an association between amyloidosis and Alzheimer's obviously.  Patient's sister had Alzheimer's.  Companion is still concerned about his memory changes, has not been so bad with his irritation however she still states that he is having agitation and behavioral problems and is obsessed with sex forgetting names and places more short-term memory loss.    We discussed a different  plan, he refuses to go back into an MRI and given the recent diagnosis of amyloidosis I think what we can do is an FDG PET scan to distinguish between Alzheimer's or Lewy body dementia, also test serum for amyloid tau neurodegeneration profile and also test him for Apo E4 gene.  It is unclear whether this is Alzheimer's, Lewy Body (some parkinsonian feautures on exam)or a 89 year old man with physiologic memory loss.  He agreed.  I also discussed starting him on possibly a Zoloft I will reach out to Dr. Damita Dunnings.  After get stitches off of the right hand, ask about occupational therapy Two blood tests that can measures some proteins and genes in the blood to give [place] an idea of alzheimer's risk FDG PET SCAN for Alzheimer's dementia vs lewy body or even FTD - he has some parkinsonian features(LBD?), his behavioral issues seem more advanced(obsessive behaviors, behavioral changes significant) than memory (FTD?) but likely Alzheimer's disease. Reach out to dr Willeen Niece about starting mood medication   Value of FDG-PET in the Evaluation of AD FDG-PET has been proven to be a promising modality for detecting functional brain changes in AD, identifying changes in early AD, and helping to differentiate AD from other causes of dementia. Many studies have been published evaluating the value of FDG-PET in AD for the last 3 decades. A meta-analysis including 27 studies evaluating FDG-PET in the diagnosis of AD resulted in a pooled sensitivity (SN) of 91% (95% confidence interval [CI], 86%-94%] and specificity (SP) of 86% (95% CI, 79%-91%). The analysis included 119 studies evaluating the role of different modalities in the diagnosis of AD. The results from the meta-analysis showed that FDG-PET has superior diagnostic accuracy in comparison with the other available diagnostic methods such as clinical guidelines, MRI, CT, SPECT, and biomarkers.24 Studies have also shown that FDG-PET has the potential to differentiate patients  with AD from normal subjects and patients with other causes of dementia. A study by Mosconi et al25 has shown that FDG-PET can differentiate patients with AD from normal subjects with an SN and SP of 99% and 98%, respectively, from patients with DLB with an SN and SP of 99% and 71%, respectively, and from patients with FTD with an SN and SP of 99% and 65%, respectively.   Orders Placed This Encounter  Procedures   NM PET Metabolic Brain   APOE Alzheimer's Risk   ATN PROFILE   Basic Metabolic Panel     Cc: Tonia Ghent, MD,  Tonia Ghent, MD  Sarina Ill, MD  [facility] [address] [place], [place] [address]  Phone [phone] Fax [phone]  I spent 70 minutes of face-to-face and non-face-to-face time with patient on the  1. 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 Alzheimer's disease, unspecified (CODE) ([place])   3. Parkinsonian features   4. Cognitive and  behavioral changes   5. Behavioral change   6. Obsessive behavior     diagnosis.  This included previsit chart review, lab review, study review, order entry, electronic health record documentation, patient education on the different diagnostic and therapeutic options, counseling and coordination of care, risks and benefits of management, compliance, or risk factor reduction

## 2022-06-01 NOTE — Patient Instructions (Addendum)
After get stitches off of the right hand, ask about occupational therapy Two blood tests that can measures some proteins and genes in the blood to give [place] an idea of alzheimer's risk FDG PET SCAN for Alzheimer's dementia Reach out to dr Willeen Niece about starting mood medication   Value of FDG-PET in the Evaluation of AD FDG-PET has been proven to be a promising modality for detecting functional brain changes in AD, identifying changes in early AD, and helping to differentiate AD from other causes of dementia. Many studies have been published evaluating the value of FDG-PET in AD for the last 3 decades. A meta-analysis including 27 studies evaluating FDG-PET in the diagnosis of AD resulted in a pooled sensitivity (SN) of 91% (95% confidence interval [CI], 86%-94%] and specificity (SP) of 86% (95% CI, 79%-91%). The analysis included 119 studies evaluating the role of different modalities in the diagnosis of AD. The results from the meta-analysis showed that FDG-PET has superior diagnostic accuracy in comparison with the other available diagnostic methods such as clinical guidelines, MRI, CT, SPECT, and biomarkers.24 Studies have also shown that FDG-PET has the potential to differentiate patients with AD from normal subjects and patients with other causes of dementia. A study by Mosconi et al25 has shown that FDG-PET can differentiate patients with AD from normal subjects with an SN and SP of 99% and 98%, respectively, from patients with DLB with an SN and SP of 99% and 71%, respectively, and from patients with FTD with an SN and SP of 99% and 65%, respectively.  Alzheimer's Disease Alzheimer's disease is a brain disease that affects memory, thinking, language, and behavior. People with Alzheimer's disease lose mental abilities, and the disease gets worse over time. Alzheimer's disease is a form of dementia. What are the causes? This condition develops when a protein called beta-amyloid forms deposits in the  brain. It is not known what causes these deposits to form. Alzheimer's disease may also be caused by a gene mutation that is inherited from one parent or both parents. A gene mutation is a harmful change in a gene. Not everyone who inherits the genetic mutation will get the disease. What increases the risk? You are more likely to develop this condition if you: Are older than age 17. Are male. Have any of these medical conditions: High blood pressure. Diabetes. Heart or blood vessel disease. Smoke. Have obesity. Have had a brain injury. Have had a stroke. Have a family history of dementia. What are the signs or symptoms? Symptoms of this condition may happen in three stages, which often overlap. Early stage In this stage, you may continue to be independent. You may still be able to drive, work, and be social. Symptoms in this stage include: Minor memory problems, such as forgetting a name, words, or what you did recently. Difficulty with: Paying attention. Communicating. Doing familiar tasks. Problem solving or doing calculations. Following instructions. Learning new things. Anxiety. Social withdrawal. Loss of motivation. Moderate stage In this stage, you will start to need care. Symptoms in this stage include: Difficulty with expressing thoughts. Memory loss that affects daily life. This can include forgetting: Recent events that have happened. If you have taken medicines or eaten. Familiar places. You may get lost while walking or driving. To pay bills or manage finances. Personal hygiene such as bathing or using the bathroom. Confusion about where you are or what time it is. Difficulty in judging distance. Changes in personality, mood, and behavior. You may be moody, irritable, angry,  frustrated, fearful, anxious, or suspicious. Poor reasoning and judgment. Delusions or hallucinations. Changes in sleep patterns. Severe stage In the final stage, you will need help  with your personal care and daily activities. Symptoms in this stage include: Worsening memory loss. Personality changes. Loss of awareness of your surroundings. Changes in physical abilities, including the ability to walk, sit, and swallow. Difficulty in communicating. Inability to control your bladder and bowels. Increasing confusion. Increasing behavior changes. How is this diagnosed?  This condition is diagnosed by a health care provider who specializes in diseases of the nervous system (neurologist) or one who specializes in care of the elderly (geriatrician or geriatric psychiatrist). Other causes of dementia may also be ruled out. Your health care provider will talk with you and your family, friends, or caregivers about your history and symptoms. A thorough medical history will be taken, and you will have a physical exam and tests. Tests may include: Lab tests, such as blood or urine tests. Imaging tests, such as a CT scan, a PET scan, or an MRI. A lumbar puncture. This test involves removing and testing a small amount of the fluid that surrounds the brain and spinal cord. An electroencephalogram (EEG). In this test, small metal discs are used to measure electrical activity in the brain. Memory tests, cognitive tests, and neuropsychological tests. These tests evaluate brain function. Genetic testing. This may be done if you have early onset of the disease (before age 66) or if other family members have the disease. How is this treated? At this time, there is no treatment to cure Alzheimer's disease or stop it from getting worse. The goals of treatment are: To manage behavioral changes. To provide you with a safe environment. To help manage daily life for you and your caregivers. The following treatment options are available: Medicines. Medicines may help the memory work better and manage behavioral symptoms. Cognitive therapy. Cognitive therapy provides you with education, support,  and memory aids. It is most helpful in the early stages of the condition. Counseling or spiritual guidance. It is normal to have a lot of feelings, including anger, relief, fear, and isolation. Counseling and guidance can help you deal with these feelings. Caregiving. This involves having caregivers help you with your daily activities. Family support groups. These provide education, emotional support, and information about community resources to family members who are taking care of you. Follow these instructions at home:  Medicines Take over-the-counter and prescription medicines only as told by your health care provider. Use a pill organizer or pill reminder to help you manage your medicines. Avoid taking medicines that can affect thinking, such as pain medicines or sleeping medicines. Lifestyle Make healthy lifestyle choices: Be physically active as told by your health care provider. Regular exercise may help improve symptoms. Do not use any products that contain nicotine or tobacco, such as cigarettes, e-cigarettes, and chewing tobacco. If you need help quitting, ask your health care provider. Do not drink alcohol. Eat a healthy diet. Practice stress-management techniques when you get stressed. Stay social. Drink enough fluid to keep your urine pale yellow. Make sure to get quality sleep. Avoid taking long naps during the day. Take short naps of 30 minutes or less if needed. Keep your sleeping area dark and cool. Avoid exercising during the few hours before you go to bed. Avoid caffeine products in the afternoon and evening. General instructions Work with your health care provider to determine what you need help with and what your safety needs are.  If you were given a bracelet that identifies you as a person with memory loss or tracks your location, make sure to wear it at all times. Talk with your health care provider about whether it is safe for you to drive. Work with your family  to make important decisions, such as advance directives, medical power of attorney, or a living will. Keep all follow-up visits. This is important. Where to find more information The Alzheimer's Association: Call the 24-hour helpline at 1-906-788-9403, or visit [employer] Contact a health care provider if: You have nausea, vomiting, or trouble with eating related to a medicine. You have worsening mood or behavior changes, such as depression, anxiety, or hallucinations. You or your family members become concerned for your safety. Get help right away if: You become less responsive or are difficult to wake up. Your memory suddenly gets worse. You feel that you want to harm yourself. If you ever feel like you may hurt yourself or others, or have thoughts about taking your own life, get help right away. Go to your nearest emergency department or: Call your local emergency services (911 in the [place] Call a suicide crisis helpline, such as the [place] at (240) 259-2437 or 988 in the [place]. This is open 24 hours a day in the [place] Text the Crisis Text Line at (636)787-0375 (in the [place].). Summary Alzheimer's disease is a brain disease that affects memory, thinking, language, and behavior. Alzheimer's disease is a form of dementia. This condition is diagnosed by a specialist in diseases of the nervous system (neurologist) or one who specializes in care of the elderly. At this time, there is no treatment to cure Alzheimer's disease or stop it from getting worse. The goal of treatment is to help you manage any symptoms. Work with your family to make important decisions, such as advance directives, medical power of attorney, or a living will. This information is not intended to replace advice given to you by your health care provider. Make sure you discuss any questions you have with your health care provider. Document Revised: 11/26/2020 Document Reviewed: 08/20/2019 Elsevier Patient  Education  [place].

## 2022-06-11 LAB — ATN PROFILE
A -- Beta-amyloid 42/40 Ratio: 0.092 — ABNORMAL LOW (ref >0.102)
Beta-amyloid 40: 225.4 pg/mL
Beta-amyloid 42: 20.69 pg/mL
N -- NfL, Plasma: 4.53 pg/mL (ref 0.00–11.55)
T -- p-tau181: 1.59 pg/mL — ABNORMAL HIGH (ref 0.00–0.97)

## 2022-06-11 LAB — APOE ALZHEIMER'S RISK

## 2022-06-11 LAB — BASIC METABOLIC PANEL
BUN/Creatinine Ratio: 19 (ref 10–24)
BUN: 18 mg/dL (ref 10–36)
CO2: 25 mmol/L (ref 20–29)
Calcium: 9.5 mg/dL (ref 8.6–10.2)
Chloride: 100 mmol/L (ref 96–106)
Creatinine, Ser: 0.93 mg/dL (ref 0.76–1.27)
Glucose: 96 mg/dL (ref 70–99)
Potassium: 4.7 mmol/L (ref 3.5–5.2)
Sodium: 139 mmol/L (ref 134–144)
eGFR: 75 mL/min/{1.73_m2} (ref >59)

## 2022-06-14 ENCOUNTER — Telehealth: Payer: Self-pay | Admitting: Neurology

## 2022-06-14 NOTE — Telephone Encounter (Signed)
I called and spoke to pts wife.  Relayed the lab work results per Dr. Ferdinand Lango note. She verbalized understanding.  She had not heard about the scheduling for the PET scan. (Looks like medicare pending clinical review).  I told will follow up and see what referrals can tell me and then let her know by phone.  She is ok to proceed with zoloft and wants [place] to order.

## 2022-06-14 NOTE — Telephone Encounter (Signed)
I got an approval today: [employer] medicare auth: [id number] exp. 06/11/22-07/26/22 sent to [facility] [phone]

## 2022-06-14 NOTE — Telephone Encounter (Signed)
I called pts wife to call us back for lab results.

## 2022-06-14 NOTE — Telephone Encounter (Signed)
I called wife back and pt is scheduled for PET scan 06-24-2022 over at [facility].  She did received call. I gave her the # as well if she needed it.  Appreciated call back.

## 2022-06-14 NOTE — Telephone Encounter (Signed)
Pod 4: Please discuss with wife. He does not have an alzheimer's gene however his bloodword results are consistent with the presence of Alzheimer's related pathology. We can see what the PET Scan shows. Also Dr. Damita Dunnings is fine with starting zoloft which may help with behavioral issues as discussed at appointment, if she agrees I can order (thanks you to our colleague dr Damita Dunnings, Juluis Rainier)

## 2022-06-16 NOTE — Telephone Encounter (Signed)
I thank all involved.

## 2022-06-18 ENCOUNTER — Ambulatory Visit: Payer: Medicare Other | Admitting: Internal Medicine

## 2022-06-23 ENCOUNTER — Encounter: Payer: Self-pay | Admitting: Internal Medicine

## 2022-06-23 ENCOUNTER — Ambulatory Visit (INDEPENDENT_AMBULATORY_CARE_PROVIDER_SITE_OTHER): Payer: Medicare Other | Admitting: Internal Medicine

## 2022-06-23 VITALS — BP 120/74 | HR 70 | Ht 68.0 in | Wt 135.0 lb

## 2022-06-23 DIAGNOSIS — I5032 Chronic diastolic (congestive) heart failure: Secondary | ICD-10-CM | POA: Diagnosis not present

## 2022-06-23 DIAGNOSIS — I493 Ventricular premature depolarization: Secondary | ICD-10-CM | POA: Diagnosis not present

## 2022-06-23 DIAGNOSIS — I10 Essential (primary) hypertension: Secondary | ICD-10-CM

## 2022-06-23 DIAGNOSIS — I43 Cardiomyopathy in diseases classified elsewhere: Secondary | ICD-10-CM

## 2022-06-23 DIAGNOSIS — E854 Organ-limited amyloidosis: Secondary | ICD-10-CM

## 2022-06-23 NOTE — Progress Notes (Signed)
Follow-up Outpatient Visit Date: 06/23/2022  Primary Care Provider: Tonia Ghent, MD [place] [address]  Chief Complaint: Follow-up infiltrative cardiomyopathy and PVCs  HPI:  [name] is a 89 y.o. 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  I met him in November for evaluation of abnormal echocardiogram.  Subsequent cardiac MRI was consistent with infiltrative cardiomyopathy.  LVEF was normal.  [name] subsequently met with Dr. Tasia Catchings; they have agreed to defer chemotherapy for MGUS/amyloidosis.  Today, [name] reports that he feels fairly well.  His left carpal tunnel syndrome has resolved.  He underwent surgery on the right wrist about a month ago and reports some improvement in his carpal tunnel symptoms.  He complains of fatigue when walking extended distances but otherwise is asymptomatic without chest pain, dyspnea at rest, palpitations, lightheadedness, and edema.  His daughter, who accompanies [name] today, notes that he has some difficulty walking due to generalized fatigue and poor vision.  He usually needs someone with him to walk; they request a handicap placard today.  --------------------------------------------------------------------------------------------------  Past Medical History:  Diagnosis Date   BPH (benign prostatic hyperplasia) 06/1997   Cardiac amyloidosis (HCC)    GERD (gastroesophageal reflux disease)    schatzki ring dilated, [facility] (Dr. Uvaldo Rising)   Hyperlipemia 1990's   Hypertension 02/2004   MGUS (monoclonal gammopathy of unknown significance)    TIA (transient ischemic attack)    was not on aspirin at time of TIA per patient   Past Surgical History:  Procedure Laterality Date   APPENDECTOMY  1947   bilateral hernia repair  [place] Bilateral     CHOLECYSTECTOMY  07/2003   DG CHEST FOR [employer] EMPLOYEE  06/02-08/2006   TIA, Dyslipidemia, HTN, Right Sided Carotid Artery Stenosis 40-60%   ESOPHAGOGASTRODUODENOSCOPY  03/23/2001   schatzki ring dilated, [facility] (Dr. Uvaldo Rising)   ESOPHAGOGASTRODUODENOSCOPY  06/13/2012   Procedure: ESOPHAGOGASTRODUODENOSCOPY (EGD);  Surgeon: Jerene Bears, MD;  Location: [facility];  Service: Gastroenterology;  Laterality: N/A;   holter monitor  10/17/2001   ISOL, PVC's, SVC's   MRI Brain     nml, MRA brain, intracran athersc dz MCA RPCA 10/18/06, Head CT NML 10/17/06   MRI Cerv Spine  10/18/2006   mild degen changes    Current Meds  Medication Sig   aspirin 81 MG tablet Take 81 mg by mouth daily.   Cyanocobalamin (VITAMIN B12) 1000 MCG TBCR Take 1 tablet by mouth daily.   loratadine (CLARITIN) 10 MG tablet TAKE 1 TABLET BY MOUTH ONCE DAILY   pantoprazole (PROTONIX) 40 MG tablet TAKE 1 TABLET BY MOUTH ONCE DAILY   VITAMIN D PO Take 1 tablet by mouth daily.    Allergies: Atorvastatin, Ezetimibe-simvastatin, Sildenafil, and Simvastatin  Social History   Tobacco Use   Smoking status: Former    Packs/day: 0.50    Years: 55.00    Total pack years: 27.50    Types: Cigarettes    Quit date: 05/17/1970    Years since quitting: 52.1   Smokeless tobacco: Never   Tobacco comments:    quit 1972  Vaping Use   Vaping Use: Never used  Substance Use Topics   Alcohol use: No   Drug use: No    Family History  Problem Relation Age of Onset   Stroke Mother    Diabetes  Sister    Heart disease Sister        CAD, AFib, GI bleed, FE deficiency   Dementia Sister    Prostate cancer Brother    Cancer Brother 39       lungs, smoker   Colon cancer Neg Hx     Review of Systems: A 12-system review of systems was performed and was negative except as noted in the HPI.  --------------------------------------------------------------------------------------------------  Physical Exam: BP 120/74 (BP Location: Left  Arm, Patient Position: Sitting)   Pulse 70   Ht '5\' 8"'$  (1.727 m)   Wt 135 lb (61.2 kg)   SpO2 98%   BMI 20.53 kg/m   General:  NAD. Neck: No JVD or HJR. Lungs: Clear to auscultation bilaterally without wheezes or crackles. Heart: Regular rate and rhythm with occasional extrasystoles.  No rubs or gallops. Abdomen: Soft, nontender, nondistended. Extremities: No lower extremity edema.  Lab Results  Component Value Date   WBC 6.2 04/22/2022   HGB 15.0 04/22/2022   HCT 42.8 04/22/2022   MCV 95 04/22/2022   PLT 193 04/22/2022    Lab Results  Component Value Date   NA 139 06/01/2022   K 4.7 06/01/2022   CL 100 06/01/2022   CO2 25 06/01/2022   BUN 18 06/01/2022   CREATININE 0.93 06/01/2022   GLUCOSE 96 06/01/2022   ALT 16 04/22/2022    Lab Results  Component Value Date   CHOL 176 02/04/2021   HDL 36 (L) 02/04/2021   LDLCALC 123 (H) 02/04/2021   LDLDIRECT 152.2 06/12/2012   TRIG 87 02/04/2021   CHOLHDL 4.9 02/04/2021    --------------------------------------------------------------------------------------------------  ASSESSMENT AND PLAN: Cardiac amyloidosis with chronic HFpEF: [name] was noted to have MRI and echo findings consistent with amyloidosis.  LVEF is preserved.  He appears euvolemic on exam.  Functional status is difficult to assess but is likely NYHA class III.  He has deferred chemotherapy after long discussions with Dr. Tasia Catchings.  Given his preserved LVEF and relative lack of symptoms for his level of activity, we have agreed to defer pharmacotherapy.  PVCs: Fairly frequent PVCs previously noted.  A few extrasystoles appreciated on exam today.  We discussed ischemia testing and ambulatory cardiac monitoring but have agreed to defer this as we would like to avoid any invasive procedures if possible.  If [name] develops any symptoms, he should let us know so that we can readdress this.  Hypertension: Blood pressure well-controlled.  No medication changes at  this time.  Follow-up: Return to clinic in 6 months.  Nelva Bush, MD 06/23/2022 2:00 PM

## 2022-06-23 NOTE — Patient Instructions (Signed)
Medication Instructions:  Your Physician recommend you continue on your current medication as directed.    *If you need a refill on your cardiac medications before your next appointment, please call your pharmacy*   Lab Work: None ordered today   Testing/Procedures: None ordered today   Follow-Up: At [facility], you and your health needs are our priority.  As part of our continuing mission to provide you with exceptional heart care, we have created designated Provider Care Teams.  These Care Teams include your primary Cardiologist (physician) and Advanced Practice Providers (APPs -  Physician Assistants and Nurse Practitioners) who all work together to provide you with the care you need, when you need it.  We recommend signing up for the patient portal called "MyChart".  Sign up information is provided on this After Visit Summary.  MyChart is used to connect with patients for Virtual Visits (Telemedicine).  Patients are able to view lab/test results, encounter notes, upcoming appointments, etc.  Non-urgent messages can be sent to your provider as well.   To learn more about what you can do with MyChart, go to [email or web address].    Your next appointment:   6 month(s)  Provider:   You may see Christopher End, MD or one of the following Advanced Practice Providers on your designated Care Team:   Christopher Berge, NP [place], PA-C Cadence Furth, PA-C Sheri Hammock, NP     

## 2022-06-24 ENCOUNTER — Ambulatory Visit (HOSPITAL_COMMUNITY)
Admission: RE | Admit: 2022-06-24 | Discharge: 2022-06-24 | Disposition: A | Discharge status: Home / Self Care | Payer: Medicare Other | Source: Ambulatory Visit | Attending: Neurology | Admitting: Neurology

## 2022-06-24 DIAGNOSIS — I10 Essential (primary) hypertension: Secondary | ICD-10-CM | POA: Diagnosis present

## 2022-06-24 DIAGNOSIS — I43 Cardiomyopathy in diseases classified elsewhere: Secondary | ICD-10-CM | POA: Diagnosis present

## 2022-06-24 DIAGNOSIS — G309 Alzheimer's disease, unspecified: Secondary | ICD-10-CM | POA: Insufficient documentation

## 2022-06-24 DIAGNOSIS — I493 Ventricular premature depolarization: Secondary | ICD-10-CM | POA: Diagnosis present

## 2022-06-24 DIAGNOSIS — R4689 Other symptoms and signs involving appearance and behavior: Secondary | ICD-10-CM | POA: Diagnosis present

## 2022-06-24 DIAGNOSIS — R4189 Other symptoms and signs involving cognitive functions and awareness: Secondary | ICD-10-CM | POA: Diagnosis present

## 2022-06-24 DIAGNOSIS — G301 Alzheimer's disease with late onset: Secondary | ICD-10-CM | POA: Insufficient documentation

## 2022-06-24 DIAGNOSIS — R29818 Other symptoms and signs involving the nervous system: Secondary | ICD-10-CM | POA: Insufficient documentation

## 2022-06-24 DIAGNOSIS — I5032 Chronic diastolic (congestive) heart failure: Secondary | ICD-10-CM | POA: Diagnosis present

## 2022-06-24 DIAGNOSIS — R4681 Obsessive-compulsive behavior: Secondary | ICD-10-CM | POA: Insufficient documentation

## 2022-06-24 DIAGNOSIS — F02818 Dementia in other diseases classified elsewhere, unspecified severity, with other behavioral disturbance: Secondary | ICD-10-CM | POA: Insufficient documentation

## 2022-06-24 DIAGNOSIS — E854 Organ-limited amyloidosis: Secondary | ICD-10-CM | POA: Diagnosis present

## 2022-06-24 LAB — GLUCOSE, CAPILLARY: Glucose-Capillary: 99 mg/dL (ref 70–99)

## 2022-06-24 MED ORDER — FLUDEOXYGLUCOSE F - 18 (FDG) INJECTION
10.0000 | Freq: Once | INTRAVENOUS | Status: AC | PRN
  Administered 2022-06-24: 9.98 via INTRAVENOUS

## 2022-06-28 ENCOUNTER — Telehealth: Payer: Self-pay | Admitting: Neurology

## 2022-06-28 NOTE — Telephone Encounter (Signed)
Pod 4: Please talk to wife. Unfortunately the FDG PET SCan did show consistency with Alzhemeimer's dementia and is 90%+ sensitivie at picking it up. He also had markers in his blood consistent with alzheiemers. I'm very sorry. His pulse is too low to start him on Aricept/donepezil so the only think we could offer if Memantine and at this time I am not sure how effective it would be. She can make a follow up with me to discuss thanks  IMPRESSION: 1. Decreased relative cortical metabolism in the high parietal lobes is a pattern suggestive of Alzheimer's disease pathology; however, decreased activity in the RIGHT occipital is not typical of Alzheimer's disease pattern and can be present in Lewy body dementia. Recommend correlation with visual disturbances. 2. Normal relative frontal cortical metabolism is inconsistent frontotemporal dementia.

## 2022-06-29 NOTE — Telephone Encounter (Signed)
App neuro input.

## 2022-06-30 NOTE — Telephone Encounter (Signed)
I spoke with the pt's wife and we discussed the results of the FDG pet scan. She verbalized understanding and would appreciate an appointment with Dr Jaynee Eagles for the results to be explained to the patient. She asked if Dr Jaynee Eagles would prescribe medication for the patient to help him sleep. I let her know I would forward the request to Dr Jaynee Eagles but that she typically does not prescribe sleeping medication. I checked the last office visit. Dr Jaynee Eagles had asked the wife to discuss mood medication with Dr Damita Dunnings. I encouraged her to talk to Dr Damita Dunnings about this as well since some medications interact or perhaps there is something that can help with both. Pt's wife verbalized appreciation. I scheduled pt for an appt on Monday 3/4 at 3:30 pm so Dr Jaynee Eagles could have longer with the patient.

## 2022-06-30 NOTE — Telephone Encounter (Signed)
See below. Please offer OV here with patient/wife to talk about results and med options, if they would prefer that before the 07/19/22 OV with neuro.  Thanks.

## 2022-06-30 NOTE — Telephone Encounter (Signed)
Thank you Dr. Damita Dunnings that would be great!

## 2022-07-01 NOTE — Telephone Encounter (Signed)
Called and spoke with patients wife and scheduled OV for 07/08/22 at 10:30 am.

## 2022-07-08 ENCOUNTER — Ambulatory Visit: Payer: Medicare Other | Admitting: Family Medicine

## 2022-07-08 ENCOUNTER — Encounter: Payer: Self-pay | Admitting: Family Medicine

## 2022-07-08 VITALS — BP 108/62 | HR 79 | Temp 97.9°F | Ht 68.0 in | Wt 134.0 lb

## 2022-07-08 DIAGNOSIS — F0390 Unspecified dementia without behavioral disturbance: Secondary | ICD-10-CM

## 2022-07-08 MED ORDER — SERTRALINE HCL 25 MG PO TABS: 25.0000 mg | ORAL_TABLET | Freq: Every day | ORAL | 1 refills | Status: DC

## 2022-07-08 NOTE — Progress Notes (Signed)
Memory changes d/w pt.  He isn't driving.  Dx d/w pt.  Wife is driving and handling finances.  He has noted memory changes over the years.  Discussed neurology evaluation with likely Alzheimer's diagnosis.  We talked about options going forward with medications.  Nocturnal anxiety d/w pt.   discussed potentially using SSRI for that.  Discussed using Namenda in the future to try to help with his memory, although that is not a curative intervention.  Meds, vitals, and allergies reviewed.   ROS: Per HPI unless specifically indicated in ROS section   Nad Ncat Neck supple, no LA Rrr Ctab Abd soft, not ttp Skin well-perfused.  30 minutes were devoted to patient care in this encounter (this includes time spent reviewing the patient's file/history, interviewing and examining the patient, counseling/reviewing plan with patient).

## 2022-07-08 NOTE — Patient Instructions (Signed)
Sertraline/zoloft 34m a day.  See how that goes and update me in about 3-4 weeks.  Sooner if needed.  Take care.  Glad to see you. [place] another option that we may add on, later in the year.  Take care.  Glad to see you.

## 2022-07-09 MED ORDER — SERTRALINE HCL 25 MG PO TABS: 25.0000 mg | ORAL_TABLET | Freq: Every day | ORAL | 1 refills | Status: DC

## 2022-07-11 NOTE — Assessment & Plan Note (Signed)
Likely with Alzheimer's disease.  Discussed.  Reasonable to start Sertraline/zoloft '25mg'$  a day.  Patient and wife can see how that goes and update me in about 3-4 weeks, sooner if needed.  Routine cautions given to patient and wife. [place] is another option that we may add on, later in the year.

## 2022-07-19 ENCOUNTER — Ambulatory Visit (INDEPENDENT_AMBULATORY_CARE_PROVIDER_SITE_OTHER): Payer: Medicare Other | Admitting: Neurology

## 2022-07-19 ENCOUNTER — Encounter: Payer: Self-pay | Admitting: Neurology

## 2022-07-19 VITALS — BP 158/65 | HR 55 | Ht 68.0 in | Wt 139.0 lb

## 2022-07-19 DIAGNOSIS — F02818 Dementia in other diseases classified elsewhere, unspecified severity, with other behavioral disturbance: Secondary | ICD-10-CM

## 2022-07-19 DIAGNOSIS — G301 Alzheimer's disease with late onset: Secondary | ICD-10-CM
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
## 2022-07-19 NOTE — Patient Instructions (Signed)
Donepezil Tablets What is this medication? DONEPEZIL (doe NEP e zil) treats memory loss and confusion (dementia) in people who have Alzheimer disease. It works by improving attention, memory, and the ability to engage in daily activities. It is not a cure for dementia or Alzheimer disease. This medicine may be used for other purposes; ask your health care provider or pharmacist if you have questions. COMMON BRAND NAME(S): Aricept What should I tell my care team before I take this medication? They need to know if you have any of these conditions: Asthma or other lung disease Difficulty passing urine Head injury Heart disease History of irregular heartbeat Liver disease Seizures (convulsions) Stomach or intestinal disease, ulcers or stomach bleeding An unusual or allergic reaction to donepezil, other medications, foods, dyes, or preservatives Pregnant or trying to get pregnant Breast-feeding How should I use this medication? Take this medication by mouth with a glass of water. Follow the directions on the prescription label. You may take this medication with or without food. Take this medication at regular intervals. This medication is usually taken before bedtime. Do not take it more often than directed. Continue to take your medication even if you feel better. Do not stop taking except on your care team's advice. If you are taking the 23 mg donepezil tablet, swallow it whole; do not cut, crush, or chew it. Talk to your care team about the use of this medication in children. Special care may be needed. Overdosage: If you think you have taken too much of this medicine contact a poison control center or emergency room at once. NOTE: This medicine is only for you. Do not share this medicine with others. What if I miss a dose? If you miss a dose, take it as soon as you can. If it is almost time for your next dose, take only that dose, do not take double or extra doses. What may interact with  this medication? Do not take this medication with any of the following: Certain medications for fungal infections like itraconazole, fluconazole, posaconazole, and voriconazole Cisapride Dextromethorphan; quinidine Dronedarone Pimozide Quinidine Thioridazine This medication may also interact with the following: Antihistamines for allergy, cough and cold Atropine Bethanechol Carbamazepine Certain medications for bladder problems like oxybutynin, tolterodine Certain medications for Parkinson's disease like benztropine, trihexyphenidyl Certain medications for stomach problems like dicyclomine, hyoscyamine Certain medications for travel sickness like scopolamine Dexamethasone Dofetilide Ipratropium NSAIDs, medications for pain and inflammation, like ibuprofen or naproxen Other medications for Alzheimer's disease Other medications that prolong the QT interval (cause an abnormal heart rhythm) Phenobarbital Phenytoin Rifampin, rifabutin or rifapentine Ziprasidone This list may not describe all possible interactions. Give your health care provider a list of all the medicines, herbs, non-prescription drugs, or dietary supplements you use. Also tell them if you smoke, drink alcohol, or use illegal drugs. Some items may interact with your medicine. What should I watch for while using this medication? Visit your care team for regular checks on your progress. Check with your care team if your symptoms do not get better or if they get worse. You may get drowsy or dizzy. Do not drive, use machinery, or do anything that needs mental alertness until you know how this medication affects you. What side effects may I notice from receiving this medication? Side effects that you should report to your care team as soon as possible: Allergic reactions--skin rash, itching, hives, swelling of the face, lips, tongue, or throat Peptic ulcer--burning stomach pain, loss of appetite, bloating, burping,  heartburn, nausea, vomiting Seizures Slow heartbeat--dizziness, feeling faint or lightheaded, confusion, trouble breathing, unusual weakness or fatigue Stomach bleeding--bloody or black, tar-like stools, vomiting blood or brown material that looks like coffee grounds Trouble passing urine Side effects that usually do not require medical attention (report these to your care team if they continue or are bothersome): Diarrhea Fatigue Loss of appetite Muscle pain or cramps Nausea Trouble sleeping This list may not describe all possible side effects. Call your doctor for medical advice about side effects. You may report side effects to FDA at 1-800-FDA-1088. Where should I keep my medication? Keep out of reach of children. Store at room temperature between 15 and 30 degrees C (59 and 86 degrees F). Throw away any unused medication after the expiration date. NOTE: This sheet is a summary. It may not cover all possible information. If you have questions about this medicine, talk to your doctor, pharmacist, or health care provider.  2023 Elsevier/Gold Standard (2020-11-20 00:00:00)

## 2022-07-25 ENCOUNTER — Telehealth: Payer: Self-pay | Admitting: Family Medicine

## 2022-07-25 NOTE — Telephone Encounter (Signed)
[name]-please check with patient/wife about seeing cardiology given that he had reportedly had episodes of syncope.  My understanding is that he has stopped Zoloft in the meantime.  I think it makes sense to see cardiology and I routed this note to Dr. Saunders Revel as FYI in the meantime.  Thanks.

## 2022-07-26 ENCOUNTER — Telehealth: Payer: Self-pay | Admitting: Internal Medicine

## 2022-07-26 ENCOUNTER — Ambulatory Visit: Payer: Medicare Other | Attending: Internal Medicine

## 2022-07-26 ENCOUNTER — Other Ambulatory Visit: Payer: Self-pay | Admitting: *Deleted

## 2022-07-26 DIAGNOSIS — R55 Syncope and collapse: Secondary | ICD-10-CM

## 2022-07-26 NOTE — Telephone Encounter (Signed)
The patient has known cardiac amyloid with severe asymmetric hypertrophy of the left ventricular symptom, which places him at risk for arrhythmias and syncope due to dehydration.  I recommend that we have him wear a Zio AT for two weeks for further evaluation of his syncope and follow-up with me or an APP in about 4 weeks so that we can review the results of his monitor.  He should drink plenty of water to stay well-hydrated.  If he has recurrent syncope, his wife should call 911 again and request transfer to the ED for further evaluation.  Nelva Bush, MD [facility]

## 2022-07-26 NOTE — Telephone Encounter (Signed)
The patient's wife has been made aware. The monitor has been ordered and message sent to scheduling to move up the appointment.  ZIO AT Long term monitor-Live Telemetry  Your physician has requested you wear a ZIO patch monitor for 14 days.  This is a single patch monitor. [employer] supplies one patch monitor per enrollment. Additional  stickers are not available.  Please do not apply patch if you will be having a Nuclear Stress Test, Echocardiogram, Cardiac CT, MRI,  or Chest Xray during the period you would be wearing the monitor. The patch cannot be worn during  these tests. You cannot remove and re-apply the ZIO AT patch monitor.  Your ZIO patch monitor will be mailed 3 day USPS to your address on file. It may take 3-5 days to  receive your monitor after you have been enrolled.  Once you have received your monitor, please review the enclosed instructions. Your monitor has  already been registered assigning a specific monitor serial # to you.   Billing and Patient Assistance Program information  Theodore Demark has been supplied with any insurance information on record for billing. [employer] offers a sliding scale Patient Assistance Program for patients without insurance, or whose  insurance does not completely cover the cost of the ZIO patch monitor. You must apply for the  Patient Assistance Program to qualify for the discounted rate. To apply, call [employer] at [phone],  select option 4, select option 2 , ask to apply for the Patient Assistance Program, (you can request an  interpreter if needed). [employer] will ask your household income and how many people are in your  household. [employer] will quote your out-of-pocket cost based on this information. They will also be able  to set up a 12 month interest free payment plan if needed.  Applying the monitor   Shave hair from upper left chest.  Hold the abrader disc by orange tab. Rub the abrader in 40 strokes over left upper chest as indicated in   your monitor instructions.  Clean area with 4 enclosed alcohol pads. Use all pads to ensure the area is cleaned thoroughly. Let  dry.  Apply patch as indicated in monitor instructions. Patch will be placed under collarbone on left side of  chest with arrow pointing upward.  Rub patch adhesive wings for 2 minutes. Remove the white label marked "1". Remove the white label  marked "2". Rub patch adhesive wings for 2 additional minutes.  While looking in a mirror, press and release button in center of patch. A small green light will flash 3-4  times. This will be your only indicator that the monitor has been turned on.  Do not shower for the first 24 hours. You may shower after the first 24 hours.  Press the button if you feel a symptom. You will hear a small click. Record Date, Time and Symptom in  the Patient Log.   Starting the Gateway  In your kit there is a Hydrographic surveyor box the size of a cellphone. This is Airline pilot. It transmits all your  recorded data to [facility]. This box must always stay within 10 feet of you. Open the box and push the *  button. There will be a light that blinks orange and then green a few times. When the light stops  blinking, the Gateway is connected to the ZIO patch. Call [employer] at [phone] to confirm your monitor is transmitting.  Returning your monitor  Remove your patch and place it inside  the [employer]. In the lower half of the Gateway there is a white  bag with prepaid postage on it. Place Gateway in bag and seal. Mail package back to [place] as soon as  possible. Your physician should have your final report approximately 7 days after you have mailed back  your monitor. Call [place] at [phone] if you have questions regarding your ZIO AT  patch monitor. Call them immediately if you see an orange light blinking on your monitor.  If your monitor falls off in less than 4 days, contact our Monitor department at  [phone]. If your  monitor becomes loose or falls off after 4 days call [employer] at [phone] for suggestions on  securing your monitor

## 2022-07-26 NOTE — Telephone Encounter (Signed)
Called and spoke with the patient's wife, per the dpr. She stated that the patient has had one syncopal episode and three falls since February 23rd. The syncopal episode occurred in the bathroom. EMS was called. The patient stated that he felt the syncopal episode coming.   The patient was started on Zoloft on 2/23 which has since been discontinued. The patient's wife does not feel like it was from the Zoloft.  She did say the patient has been somewhat better since the episodes. PCP wants the patient to follow up with Dr. Saunders Revel sooner than May.

## 2022-07-26 NOTE — Telephone Encounter (Signed)
Called the heartcare office and they receptionist took a message to send back to the nurse to try and get patient an appointment sooner. She advised that they will reach out to the patient when they hear back from the nurse.

## 2022-07-26 NOTE — Telephone Encounter (Signed)
Pt c/o Syncope: STAT if syncope occurred within 30 minutes and pt complains of lightheadedness High Priority if episode of passing out, completely, today or in last 24 hours   Did you pass out today? Not sure  When is the last time you passed out? Not sure    Has this occurred multiple times? 1 complete and 3 falls  Did you have any symptoms prior to passing out? None reported  Claiborne Billings with [facility] states the patient has had syncopal episodes and was not able to schedule an appointment sooner than May. She says the patient has stopped zoloft until he can be seen. She requests the call back go to the patient.

## 2022-07-26 NOTE — Telephone Encounter (Signed)
Patient's wife calling back, states she cannot get her husband in with Dr. Saunders Revel until May 16th, 9 weeks away... Any other options

## 2022-07-26 NOTE — Telephone Encounter (Signed)
Called and spoke to patients wife, advised of Dr. Josefine Class message. Patient wife confirmed that patient has stopped taking zoloft. She stated that the patient only had one syncopal episodes but had 3 falls total. Patients wife will call to set up a follow up appt with his cardiologist.

## 2022-07-27 NOTE — Telephone Encounter (Signed)
Per EMR, Cardiology is sending him a zio and will monitor for 2 weeks and do f/u in 4 weeks.

## 2022-07-27 NOTE — Telephone Encounter (Signed)
Pt scheduled to [facility] 08/24/22 for follow up.

## 2022-07-30 ENCOUNTER — Encounter: Payer: Self-pay | Admitting: *Deleted

## 2022-07-30 DIAGNOSIS — R55 Syncope and collapse: Secondary | ICD-10-CM

## 2022-07-30 NOTE — Progress Notes (Unsigned)
Patient and wife came into office with AT Monitor to have assistance on placing that monitor. Monitor placed, instructions reviewed, and encouraged to call back if any further questions. They both verbalized understanding with no further questions at this time.

## 2022-08-05 ENCOUNTER — Other Ambulatory Visit: Payer: Medicare Other

## 2022-08-09 ENCOUNTER — Ambulatory Visit: Payer: Medicare Other | Admitting: Oncology

## 2022-08-12 ENCOUNTER — Ambulatory Visit: Payer: Medicare Other | Admitting: Oncology

## 2022-08-16 ENCOUNTER — Emergency Department: Payer: Medicare Other

## 2022-08-16 ENCOUNTER — Observation Stay
Admission: EM | Admit: 2022-08-16 | Discharge: 2022-08-18 | Disposition: A | Discharge status: Home / Self Care | Payer: Medicare Other | Attending: Osteopathic Medicine | Admitting: Osteopathic Medicine

## 2022-08-16 ENCOUNTER — Encounter: Payer: Self-pay | Admitting: Internal Medicine

## 2022-08-16 ENCOUNTER — Other Ambulatory Visit: Payer: Self-pay

## 2022-08-16 DIAGNOSIS — U071 COVID-19: Secondary | ICD-10-CM | POA: Diagnosis not present

## 2022-08-16 DIAGNOSIS — I5032 Chronic diastolic (congestive) heart failure: Secondary | ICD-10-CM | POA: Diagnosis present

## 2022-08-16 DIAGNOSIS — Z87891 Personal history of nicotine dependence: Secondary | ICD-10-CM | POA: Diagnosis not present

## 2022-08-16 DIAGNOSIS — R531 Weakness: Secondary | ICD-10-CM | POA: Diagnosis not present

## 2022-08-16 DIAGNOSIS — Z79899 Other long term (current) drug therapy: Secondary | ICD-10-CM | POA: Insufficient documentation

## 2022-08-16 DIAGNOSIS — E785 Hyperlipidemia, unspecified: Secondary | ICD-10-CM | POA: Diagnosis present

## 2022-08-16 DIAGNOSIS — Z8673 Personal history of transient ischemic attack (TIA), and cerebral infarction without residual deficits: Secondary | ICD-10-CM | POA: Diagnosis not present

## 2022-08-16 DIAGNOSIS — I493 Ventricular premature depolarization: Secondary | ICD-10-CM | POA: Diagnosis present

## 2022-08-16 DIAGNOSIS — E854 Organ-limited amyloidosis: Secondary | ICD-10-CM | POA: Diagnosis present

## 2022-08-16 DIAGNOSIS — I43 Cardiomyopathy in diseases classified elsewhere: Secondary | ICD-10-CM | POA: Diagnosis present

## 2022-08-16 DIAGNOSIS — M6281 Muscle weakness (generalized): Secondary | ICD-10-CM | POA: Diagnosis not present

## 2022-08-16 DIAGNOSIS — Z7982 Long term (current) use of aspirin: Secondary | ICD-10-CM | POA: Insufficient documentation

## 2022-08-16 DIAGNOSIS — F0390 Unspecified dementia without behavioral disturbance: Secondary | ICD-10-CM | POA: Diagnosis present

## 2022-08-16 DIAGNOSIS — D472 Monoclonal gammopathy: Secondary | ICD-10-CM | POA: Diagnosis present

## 2022-08-16 DIAGNOSIS — R2689 Other abnormalities of gait and mobility: Secondary | ICD-10-CM | POA: Insufficient documentation

## 2022-08-16 DIAGNOSIS — I110 Hypertensive heart disease with heart failure: Secondary | ICD-10-CM | POA: Diagnosis not present

## 2022-08-16 DIAGNOSIS — K219 Gastro-esophageal reflux disease without esophagitis: Secondary | ICD-10-CM | POA: Diagnosis present

## 2022-08-16 DIAGNOSIS — I10 Essential (primary) hypertension: Secondary | ICD-10-CM | POA: Diagnosis present

## 2022-08-16 HISTORY — DX: Bradycardia, unspecified: R00.1

## 2022-08-16 LAB — RESP PANEL BY RT-PCR (RSV, FLU A&B, COVID)  RVPGX2
Influenza A by PCR: NEGATIVE
Influenza B by PCR: NEGATIVE
Resp Syncytial Virus by PCR: NEGATIVE
SARS Coronavirus 2 by RT PCR: POSITIVE — AB

## 2022-08-16 LAB — CBC
HCT: 40.2 % (ref 39.0–52.0)
Hemoglobin: 13.7 g/dL (ref 13.0–17.0)
MCH: 32.2 pg (ref 26.0–34.0)
MCHC: 34.1 g/dL (ref 30.0–36.0)
MCV: 94.6 fL (ref 80.0–100.0)
Platelets: 147 10*3/uL — ABNORMAL LOW (ref 150–400)
RBC: 4.25 MIL/uL (ref 4.22–5.81)
RDW: 12.2 % (ref 11.5–15.5)
WBC: 6.1 10*3/uL (ref 4.0–10.5)
nRBC: 0 % (ref 0.0–0.2)

## 2022-08-16 LAB — URINALYSIS, ROUTINE W REFLEX MICROSCOPIC
Bacteria, UA: NONE SEEN
Bilirubin Urine: NEGATIVE
Glucose, UA: NEGATIVE mg/dL
Hgb urine dipstick: NEGATIVE
Ketones, ur: NEGATIVE mg/dL
Leukocytes,Ua: NEGATIVE
Nitrite: NEGATIVE
Protein, ur: 30 mg/dL — AB
Specific Gravity, Urine: 1.023 (ref 1.005–1.030)
Squamous Epithelial / HPF: NONE SEEN /HPF (ref 0–5)
WBC, UA: NONE SEEN WBC/hpf (ref 0–5)
pH: 6 (ref 5.0–8.0)

## 2022-08-16 LAB — BASIC METABOLIC PANEL
Anion gap: 9 (ref 5–15)
BUN: 17 mg/dL (ref 8–23)
CO2: 27 mmol/L (ref 22–32)
Calcium: 9 mg/dL (ref 8.9–10.3)
Chloride: 98 mmol/L (ref 98–111)
Creatinine, Ser: 0.95 mg/dL (ref 0.61–1.24)
GFR, Estimated: >60 mL/min (ref >60)
Glucose, Bld: 135 mg/dL — ABNORMAL HIGH (ref 70–99)
Potassium: 4.4 mmol/L (ref 3.5–5.1)
Sodium: 134 mmol/L — ABNORMAL LOW (ref 135–145)

## 2022-08-16 LAB — TROPONIN I (HIGH SENSITIVITY)
Troponin I (High Sensitivity): 35 ng/L — ABNORMAL HIGH (ref <18)
Troponin I (High Sensitivity): 36 ng/L — ABNORMAL HIGH (ref <18)

## 2022-08-16 MED ORDER — VITAMIN B-12 1000 MCG PO TABS
1000.0000 ug | ORAL_TABLET | Freq: Every day | ORAL | Status: DC
  Administered 2022-08-17 – 2022-08-18 (×2): 1000 ug via ORAL
  Filled 2022-08-16 (×2): qty 1

## 2022-08-16 MED ORDER — ENOXAPARIN SODIUM 40 MG/0.4ML IJ SOSY
40.0000 mg | PREFILLED_SYRINGE | INTRAMUSCULAR | Status: DC
  Administered 2022-08-17 – 2022-08-18 (×2): 40 mg via SUBCUTANEOUS
  Filled 2022-08-16 (×2): qty 0.4

## 2022-08-16 MED ORDER — ONDANSETRON HCL 4 MG/2ML IJ SOLN: 4.0000 mg | Freq: Four times a day (QID) | INTRAMUSCULAR | Status: DC | PRN

## 2022-08-16 MED ORDER — ONDANSETRON HCL 4 MG PO TABS: 4.0000 mg | ORAL_TABLET | Freq: Four times a day (QID) | ORAL | Status: DC | PRN

## 2022-08-16 MED ORDER — ACETAMINOPHEN 650 MG RE SUPP: 650.0000 mg | Freq: Four times a day (QID) | RECTAL | Status: DC | PRN

## 2022-08-16 MED ORDER — BISACODYL 10 MG RE SUPP: 10.0000 mg | Freq: Every day | RECTAL | Status: DC | PRN

## 2022-08-16 MED ORDER — ACETAMINOPHEN 325 MG PO TABS: 650.0000 mg | ORAL_TABLET | Freq: Four times a day (QID) | ORAL | Status: DC | PRN

## 2022-08-16 MED ORDER — PANTOPRAZOLE SODIUM 40 MG PO TBEC
40.0000 mg | DELAYED_RELEASE_TABLET | Freq: Every day | ORAL | Status: DC
  Administered 2022-08-17 – 2022-08-18 (×2): 40 mg via ORAL
  Filled 2022-08-16 (×2): qty 1

## 2022-08-16 MED ORDER — HYDRALAZINE HCL 20 MG/ML IJ SOLN: 5.0000 mg | Freq: Three times a day (TID) | INTRAMUSCULAR | Status: DC | PRN

## 2022-08-16 MED ORDER — LORATADINE 10 MG PO TABS
10.0000 mg | ORAL_TABLET | Freq: Every day | ORAL | Status: DC
  Administered 2022-08-17 – 2022-08-18 (×2): 10 mg via ORAL
  Filled 2022-08-16 (×2): qty 1

## 2022-08-16 MED ORDER — SENNOSIDES-DOCUSATE SODIUM 8.6-50 MG PO TABS: 1.0000 | ORAL_TABLET | Freq: Every evening | ORAL | Status: DC | PRN

## 2022-08-16 NOTE — ED Notes (Signed)
First Nurse Note: Pt to ED via [facility] from home for sudden onset generalized weakness. Pt has hx/o Alzheimer's. Pt is normally able to walk on his own with a walker but was not able to today. Pt is currently in NAD.

## 2022-08-16 NOTE — Hospital Course (Addendum)
[name] is a 89 year old male with history of GERD, vitamin D deficiency, who presents to the emergency department for chief concern of sudden onset of weakness.  Initial vitals in the emergency department showed temperature of 98.5, respiration rate 16, heart rate 68, blood pressure 127/80, SpO2 92% on room air.  Serum sodium is 134, potassium 4.4, chloride 98, bicarb 27, BUN of 17, serum creatinine 0.95, nonfasting blood glucose 135, WBC 6.1, hemoglobin 13.7, platelets of 147.  High sensitive troponin was 35 and on repeat was 36. UA was negative for leukocytes and nitrates.  Patient tested positive for COVID 19 infection.    ED treatment: None

## 2022-08-16 NOTE — ED Provider Notes (Signed)
[facility] Provider Note    Event Date/Time   First MD Initiated Contact with Patient 08/16/22 2107     (approximate)   History   Weakness   HPI  [name] is a 89 y.o. male with history of Alzheimer's dementia, MGUS, BPH, GERD, hypertension, hyperlipidemia, cardiac amyloidosis, and TIA who presents with weakness, acute onset today.  The patient states that he felt like his legs lost all strength when he tried to get up around 5 AM.  He fell but did not hit his head.  He denies any pain from the fall.  He then was able to walk, subsequently sat in a chair for a while.  But later in the afternoon he tried to get up again and once again felt that his legs were weak and he could not support himself.  He denies any headache, dizziness fever or chills, vomiting or diarrhea, difficulty breathing, or other acute symptoms.  He has no pain at this time.  He has no numbness or tingling in the legs.  I reviewed the past medical records.  The patient's most recent outpatient encounter was with Dr. Jaynee Eagles from neurology on 3/4 for follow-up of his Alzheimer's diagnosis.   Physical Exam   Triage Vital Signs: ED Triage Vitals  Enc Vitals Group     BP 08/16/22 1900 127/80     Pulse Rate 08/16/22 1900 68     Resp 08/16/22 1900 16     Temp 08/16/22 1900 98.5 F (36.9 C)     Temp Source 08/16/22 1900 Oral     SpO2 08/16/22 1900 92 %     Weight 08/16/22 1900 134 lb (60.8 kg)     Height 08/16/22 1900 5\' 8"  (1.727 m)     Head Circumference --      Peak Flow --      Pain Score 08/16/22 1907 0     Pain Loc --      Pain Edu? --      Excl. in [place]? --     Most recent vital signs: Vitals:   08/16/22 2200 08/16/22 2230  BP: (!) 141/69 123/63  Pulse:  60  Resp: 14 19  Temp:    SpO2: 98% 97%     General: Awake, no distress.  CV:  Good peripheral perfusion.  Resp:  Normal effort. Abd:  No distention.  Other:  EOMI.  PERRLA.  No facial droop.  Motor and sensory  intact in all extremities.  5/5 motor strength and intact sensation to bilateral lower extremities.  No pronator drift.  No ataxia.  Moist mucous membranes.   ED Results / Procedures / Treatments   Labs (all labs ordered are listed, but only abnormal results are displayed) Labs Reviewed  RESP PANEL BY RT-PCR (RSV, FLU A&B, COVID)  RVPGX2 - Abnormal; Notable for the following components:      Result Value   SARS Coronavirus 2 by RT PCR POSITIVE (*)    All other components within normal limits  BASIC METABOLIC PANEL - Abnormal; Notable for the following components:   Sodium 134 (*)    Glucose, Bld 135 (*)    All other components within normal limits  CBC - Abnormal; Notable for the following components:   Platelets 147 (*)    All other components within normal limits  URINALYSIS, ROUTINE W REFLEX MICROSCOPIC - Abnormal; Notable for the following components:   Color, Urine YELLOW (*)    APPearance CLEAR (*)  Protein, ur 30 (*)    All other components within normal limits  TROPONIN I (HIGH SENSITIVITY) - Abnormal; Notable for the following components:   Troponin I (High Sensitivity) 35 (*)    All other components within normal limits  TROPONIN I (HIGH SENSITIVITY) - Abnormal; Notable for the following components:   Troponin I (High Sensitivity) 36 (*)    All other components within normal limits  BASIC METABOLIC PANEL  CBC     EKG  ED ECG REPORT I, Arta Silence, the attending physician, personally viewed and interpreted this ECG.  Date: 08/16/2022 EKG Time: 1911 Rate: 66 Rhythm: normal sinus rhythm QRS Axis: Left axis Intervals: Prolonged PR, incomplete RBBB ST/T Wave abnormalities: normal Narrative Interpretation: no evidence of acute ischemia    RADIOLOGY  Chest x-ray: I independently viewed and interpreted the images; there is no focal consolidation or edema  CT head:  IMPRESSION:  1. No acute intracranial abnormality.  2. Progressive senescent  change.  3. Mild paranasal sinus disease.  4. 2 cm rounded fluid attenuation mass within the left nasal  passage, likely a retention cyst.    PROCEDURES:  Critical Care performed: No  Procedures   MEDICATIONS ORDERED IN ED: Medications  acetaminophen (TYLENOL) tablet 650 mg (has no administration in time range)    Or  acetaminophen (TYLENOL) suppository 650 mg (has no administration in time range)  ondansetron (ZOFRAN) tablet 4 mg (has no administration in time range)    Or  ondansetron (ZOFRAN) injection 4 mg (has no administration in time range)  enoxaparin (LOVENOX) injection 40 mg (has no administration in time range)  senna-docusate (Senokot-S) tablet 1 tablet (has no administration in time range)  bisacodyl (DULCOLAX) suppository 10 mg (has no administration in time range)  hydrALAZINE (APRESOLINE) injection 5 mg (has no administration in time range)     IMPRESSION / MDM / ASSESSMENT AND PLAN / ED COURSE  I reviewed the triage vital signs and the nursing notes.  89 year old male with PMH as noted above presents with bilateral leg weakness today, subsequent fall, and then persistent weakness later in the day.  He denies any other focal symptoms.  On exam currently his vital signs are normal, he is alert and well-appearing, and in the stretcher is able to move his legs normally and has 5/5 strength and intact sensation.  There are no unilateral or localizing neurologic findings.  Differential diagnosis includes, but is not limited to, global etiologies such as electrolyte abnormality, other metabolic disturbance, UTI or other infection, less likely cardiac etiology.  I do not suspect acute stroke or TIA given the bilateral symptoms.  The patient has no neurologic deficits to suggest cauda equina syndrome or other spinal etiology.  He has no pain or any evidence of trauma.  We will obtain basic and cardiac labs, respiratory panel, urinalysis, CT head, chest x-ray, and  reassess.  Patient's presentation is most consistent with acute presentation with potential threat to life or bodily function.  The patient is on the cardiac monitor to evaluate for evidence of arrhythmia and/or significant heart rate changes.  ----------------------------------------- 11:07 PM on 08/16/2022 -----------------------------------------  CT head is negative.  Lab workup is overall unremarkable.  Electrolytes are normal.  Troponins are around 35 with no significant change.  Urinalysis is negative.  However, the patient is positive for COVID which likely explains his weakness.  I attempted ambulation and the patient is still very weak and wobbly and cannot walk on his own.  He is  not safe to go home.  I consulted Dr. Tobie Poet from the hospitalist service; based on our discussion she agrees to admit the patient.   FINAL CLINICAL IMPRESSION(S) / ED DIAGNOSES   Final diagnoses:  COVID-19  Weakness     Rx / DC Orders   ED Discharge Orders     None        Note:  This document was prepared using Dragon voice recognition software and may include unintentional dictation errors.    Arta Silence, MD 08/16/22 [phone]

## 2022-08-16 NOTE — Assessment & Plan Note (Signed)
Low clinical suspicion for acute infarct at this time given generalized weakness and no focal deficit Presumed secondary to COVID-19 infection PT, OT, TOC consulted Admit to telemetry medical, observation

## 2022-08-16 NOTE — ED Triage Notes (Signed)
Patient arrived via EMS reporting generalized weakness and unable to ambulate to bathroom since 1500 today. Denies dizziness or shortness of breath. Denies pain, fever or chills. Wife also reports mild expressive aphasia that she states started today. Patient alert, speaking in full sentences. No focal weakness, no facial droop. Patient able to state name and date of birth. Able to state correct year but unable to state month or place but wife reports patient with Alzheimers and cannot typically recall place or correct date. Resp even, unlabored on RA.

## 2022-08-16 NOTE — H&P (Signed)
History and Physical   [name] [id number] DOB: April 18, 1924 DOA: 08/16/2022  PCP: Tonia Ghent, MD  Outpatient Specialists: DR. Jaynee Eagles, neurology Patient coming from: home via EMS  I have personally briefly reviewed patient's old medical records in [place].  Chief Concern: weakness  HPI: [name] is a 89 year old male with history of GERD, vitamin D deficiency, who presents to the emergency department for chief concern of sudden onset of weakness.  Initial vitals in the emergency department showed temperature of 98.5, respiration rate 16, heart rate 68, blood pressure 127/80, SpO2 92% on room air.  Serum sodium is 134, potassium 4.4, chloride 98, bicarb 27, BUN of 17, serum creatinine 0.95, nonfasting blood glucose 135, WBC 6.1, hemoglobin 13.7, platelets of 147.  High sensitive troponin was 35 and on repeat was 36. UA was negative for leukocytes and nitrates.  Patient tested positive for COVID 19 infection.    ED treatment: None ---------------------------- At bedside, patient was able to tell me his name.  He was unable to tell me his age or the current year and he had difficulty hearing.  He denies being in pain.  Spouse reports that suddenly he developed weakness in both legs and could not walk.  She denies any fever, vomiting, diarrhea.  Social history: He lives at home with his wife.  His spouse reports that patient does not smoke, drink alcohol or use drugs.  ROS: Unable to complete as patient has dementia and is hard of hearing.  ED Course: Discussed with emergency medicine provider, patient requiring hospitalization for chief concerns of weakness and unsafe discharge home.  Assessment/Plan  Principal Problem:   Weakness Active Problems:   HLD (hyperlipidemia)   Essential hypertension   GERD (gastroesophageal reflux disease)   MGUS (monoclonal gammopathy of unknown significance)   Dementia   Chronic heart failure with preserved ejection  fraction (HFpEF)   Frequent PVCs   Cardiac amyloidosis   COVID-19 virus infection   Assessment and Plan:  * Weakness Low clinical suspicion for acute infarct at this time given generalized weakness and no focal deficit Presumed secondary to COVID-19 infection PT, OT, TOC consulted Admit to telemetry medical, observation  COVID-19 virus infection Patient is maintaining airway, SpO2 is greater than 92% on room air No indication for antiviral medication at this time Continue monitoring  Chronic heart failure with preserved ejection fraction (HFpEF) Patient does not appear to be in exacerbation Patient is compensated  Essential hypertension Hydralazine 5 mg every 8 as needed for SBP greater than 175, 4 days ordered  Chart reviewed.   DVT prophylaxis: Enoxaparin Code Status: full code, confirmed with spouse at bedside Diet: heart healthy Family Communication: Updated spouse at bedside Disposition Plan: Pending clinical course Consults called: PT, OT, TOC Admission status: Telemetry medical, observation  Past Medical History:  Diagnosis Date   BPH (benign prostatic hyperplasia) 06/1997   Cardiac amyloidosis    GERD (gastroesophageal reflux disease)    schatzki ring dilated, [facility] (Dr. Uvaldo Rising)   Hyperlipemia 1990's   Hypertension 02/2004   MGUS (monoclonal gammopathy of unknown significance)    TIA (transient ischemic attack)    was not on aspirin at time of TIA per patient   Past Surgical History:  Procedure Laterality Date   APPENDECTOMY  1947   bilateral hernia repair  [place]land Bilateral    CHOLECYSTECTOMY  07/2003   DG CHEST FOR [employer] EMPLOYEE  06/02-08/2006   TIA, Dyslipidemia, HTN, Right  Sided Carotid Artery Stenosis 40-60%   ESOPHAGOGASTRODUODENOSCOPY  03/23/2001   schatzki ring dilated, [facility] (Dr. Uvaldo Rising)   ESOPHAGOGASTRODUODENOSCOPY  06/13/2012   Procedure: ESOPHAGOGASTRODUODENOSCOPY (EGD);  Surgeon: Jerene Bears, MD;  Location: [facility];  Service: Gastroenterology;  Laterality: N/A;   holter monitor  10/17/2001   ISOL, PVC's, SVC's   MRI Brain     nml, MRA brain, intracran athersc dz MCA RPCA 10/18/06, Head CT NML 10/17/06   MRI Cerv Spine  10/18/2006   mild degen changes   Social History:  reports that he quit smoking about 52 years ago. His smoking use included cigarettes. He has a 27.50 pack-year smoking history. He has never used smokeless tobacco. He reports that he does not drink alcohol and does not use drugs.  Allergies  Allergen Reactions   Atorvastatin     REACTION: PALPITATIONS   Ezetimibe-Simvastatin     REACTION: HEADACHES   Sildenafil     syncope   Simvastatin     REACTION: HEADACHE   Family History  Problem Relation Age of Onset   Stroke Mother    Diabetes Sister    Heart disease Sister        CAD, AFib, GI bleed, FE deficiency   Dementia Sister    Prostate cancer Brother    Cancer Brother 20       lungs, smoker   Colon cancer Neg Hx    Family history: Family history reviewed and not pertinent.  Prior to Admission medications   Medication Sig Start Date End Date Taking? Authorizing Provider  aspirin 81 MG tablet Take 81 mg by mouth daily.    [provider]  Cyanocobalamin (VITAMIN B12) 1000 MCG TBCR Take 1 tablet by mouth daily.    [provider]  loratadine (CLARITIN) 10 MG tablet TAKE 1 TABLET BY MOUTH ONCE DAILY 11/30/21   Tonia Ghent, MD  pantoprazole (PROTONIX) 40 MG tablet TAKE 1 TABLET BY MOUTH ONCE DAILY 12/28/21   Tonia Ghent, MD  VITAMIN D PO Take 1 tablet by mouth daily.    [provider]   Physical Exam: Vitals:   08/16/22 1900 08/16/22 2130 08/16/22 2200 08/16/22 2230  BP: 127/80  (!) 141/69 123/63  Pulse: 68 65  60  Resp: 16 15 14 19   Temp: 98.5 F (36.9 C)     TempSrc: Oral     SpO2: 92% 98% 98% 97%  Weight: 60.8 kg     Height: 5\' 8"  (1.727 m)      Constitutional: appears age-appropriate, NAD, calm,  comfortable Eyes: PERRL, lids and conjunctivae normal ENMT: Mucous membranes are moist. Posterior pharynx clear of any exudate or lesions. Age-appropriate dentition. Hearing appropriate Neck: normal, supple, no masses, no thyromegaly Respiratory: clear to auscultation bilaterally, no wheezing, no crackles. Normal respiratory effort. No accessory muscle use.  Cardiovascular: Regular rate and rhythm, no murmurs / rubs / gallops. No extremity edema. 2+ pedal pulses. No carotid bruits.  Abdomen: no tenderness, no masses palpated, no hepatosplenomegaly. Bowel sounds positive.  Musculoskeletal: no clubbing / cyanosis. No joint deformity upper and lower extremities. Good ROM, no contractures, no atrophy. Normal muscle tone.  Skin: no rashes, lesions, ulcers. No induration Neurologic: Sensation intact. Strength 5/5 in all 4.  Psychiatric: Normal judgment and insight. Alert and oriented x 3. Normal mood.   EKG: independently reviewed, showing sinus rhythm with rate of 66, QTc 452  Chest x-ray on Admission: I personally reviewed and I agree with radiologist reading  as below.  CT Head Wo Contrast  Result Date: 08/16/2022 CLINICAL DATA:  Weakness EXAM: CT HEAD WITHOUT CONTRAST TECHNIQUE: Contiguous axial images were obtained from the base of the skull through the vertex without intravenous contrast. RADIATION DOSE REDUCTION: This exam was performed according to the departmental dose-optimization program which includes automated exposure control, adjustment of the mA and/or kV according to patient size and/or use of iterative reconstruction technique. COMPARISON:  03/09/2021 FINDINGS: Brain: Normal anatomic configuration. Parenchymal volume loss is commensurate with the patient's age. Progressive, mild periventricular white matter changes are present likely reflecting the sequela of small vessel ischemia. No abnormal intra or extra-axial mass lesion or fluid collection. No abnormal mass effect or midline shift.  No evidence of acute intracranial hemorrhage or infarct. Ventricular size is normal. Cerebellum unremarkable. Vascular: No asymmetric hyperdense vasculature at the skull base. Skull: Intact Sinuses/Orbits: There is opacification of several right ethmoid air cells. Small mucous retention cyst or polyp noted within the left maxillary sinus. A 2 cm rounded fluid attenuation mass is seen within the left nasal passage,, likely a retention cyst. Remaining paranasal. Sinuses are clear. Orbits are unremarkable. Other: Mastoid air cells and middle ear cavities are clear. IMPRESSION: 1. No acute intracranial abnormality. 2. Progressive senescent change. 3. Mild paranasal sinus disease. 4. 2 cm rounded fluid attenuation mass within the left nasal passage, likely a retention cyst. Electronically Signed   By: Fidela Salisbury M.D.   On: 08/16/2022 20:22   DG Chest Port 1 View  Result Date: 08/16/2022 CLINICAL DATA:  Generalized weakness EXAM: PORTABLE CHEST 1 VIEW COMPARISON:  09/23/2017 FINDINGS: Hyperinflation. No acute airspace disease or effusion. Stable cardiomediastinal silhouette. No pneumothorax. IMPRESSION: No active disease. Hyperinflation. Electronically Signed   By: Donavan Foil M.D.   On: 08/16/2022 20:19    Labs on Admission: I have personally reviewed following labs  CBC: Recent Labs  Lab 08/16/22 1913  WBC 6.1  HGB 13.7  HCT 40.2  MCV 94.6  PLT [id number]*   Basic Metabolic Panel: Recent Labs  Lab 08/16/22 1913  NA 134*  K 4.4  CL 98  CO2 27  GLUCOSE 135*  BUN 17  CREATININE 0.95  CALCIUM 9.0   GFR: Estimated Creatinine Clearance: 39.1 mL/min (by C-G formula based on SCr of 0.95 mg/dL).  Urine analysis:    Component Value Date/Time   COLORURINE YELLOW (A) 08/16/2022 2144   APPEARANCEUR CLEAR (A) 08/16/2022 2144   APPEARANCEUR Clear 04/22/2022 1207   LABSPEC 1.023 08/16/2022 2144   PHURINE 6.0 08/16/2022 2144   GLUCOSEU NEGATIVE 08/16/2022 2144   HGBUR NEGATIVE 08/16/2022 2144    [place] NEGATIVE 08/16/2022 2144   BILIRUBINUR Negative 04/22/2022 [place] 08/16/2022 2144   PROTEINUR 30 (A) 08/16/2022 2144   NITRITE NEGATIVE 08/16/2022 2144   LEUKOCYTESUR NEGATIVE 08/16/2022 2144   This document was prepared using Dragon Voice Recognition software and may include unintentional dictation errors.  Dr. Tobie Poet [facility]  If 7PM-7AM, please contact overnight-coverage provider If 7AM-7PM, please contact day coverage provider [email or web address]  08/16/2022, 11:22 PM

## 2022-08-16 NOTE — Assessment & Plan Note (Signed)
Patient does not appear to be in exacerbation Patient is compensated

## 2022-08-16 NOTE — Assessment & Plan Note (Signed)
Patient is maintaining airway, SpO2 is greater than 92% on room air No indication for antiviral medication at this time Continue monitoring

## 2022-08-16 NOTE — Assessment & Plan Note (Signed)
Hydralazine 5 mg every 8 as needed for SBP greater than 175, 4 days ordered

## 2022-08-16 NOTE — ED Notes (Signed)
Spoke with Dr. Cherylann Banas regarding patient. Per MD, no activation of CODE STROKE at this time.

## 2022-08-17 ENCOUNTER — Encounter: Payer: Self-pay | Admitting: Internal Medicine

## 2022-08-17 DIAGNOSIS — U071 COVID-19: Secondary | ICD-10-CM | POA: Diagnosis not present

## 2022-08-17 DIAGNOSIS — R531 Weakness: Secondary | ICD-10-CM | POA: Diagnosis not present

## 2022-08-17 LAB — CBC
HCT: 39.7 % (ref 39.0–52.0)
Hemoglobin: 13.7 g/dL (ref 13.0–17.0)
MCH: 32.5 pg (ref 26.0–34.0)
MCHC: 34.5 g/dL (ref 30.0–36.0)
MCV: 94.1 fL (ref 80.0–100.0)
Platelets: 107 10*3/uL — ABNORMAL LOW (ref 150–400)
RBC: 4.22 MIL/uL (ref 4.22–5.81)
RDW: 12 % (ref 11.5–15.5)
WBC: 5.1 10*3/uL (ref 4.0–10.5)
nRBC: 0 % (ref 0.0–0.2)

## 2022-08-17 LAB — BASIC METABOLIC PANEL
Anion gap: 9 (ref 5–15)
BUN: 14 mg/dL (ref 8–23)
CO2: 25 mmol/L (ref 22–32)
Calcium: 8.9 mg/dL (ref 8.9–10.3)
Chloride: 101 mmol/L (ref 98–111)
Creatinine, Ser: 0.8 mg/dL (ref 0.61–1.24)
GFR, Estimated: >60 mL/min (ref >60)
Glucose, Bld: 101 mg/dL — ABNORMAL HIGH (ref 70–99)
Potassium: 3.7 mmol/L (ref 3.5–5.1)
Sodium: 135 mmol/L (ref 135–145)

## 2022-08-17 MED ORDER — IPRATROPIUM BROMIDE 0.06 % NA SOLN
2.0000 | Freq: Three times a day (TID) | NASAL | Status: DC
  Administered 2022-08-17 – 2022-08-18 (×3): 2 via NASAL
  Filled 2022-08-17: qty 15

## 2022-08-17 MED ORDER — ALBUTEROL SULFATE HFA 108 (90 BASE) MCG/ACT IN AERS
1.0000 | INHALATION_SPRAY | Freq: Four times a day (QID) | RESPIRATORY_TRACT | Status: AC
  Administered 2022-08-17: 1 via RESPIRATORY_TRACT
  Administered 2022-08-17 – 2022-08-18 (×2): 2 via RESPIRATORY_TRACT
  Filled 2022-08-17: qty 6.7

## 2022-08-17 NOTE — Evaluation (Signed)
Occupational Therapy Evaluation Patient Details Name: [name] MRN: [id number] DOB: 12-30-1923 Today's Date: 08/17/2022   History of Present Illness [name] is a 89 year old male with history of GERD, vitamin D deficiency, who presents to the emergency department for chief concern of sudden onset of weakness and fall at home. Found to be COVID positive.   Clinical Impression   [name] was seen for OT/PT co-evaluation this date. Prior to hospital admission, pt was IND. Pt lives with spouse. Pt presents to acute OT demonstrating impaired ADL performance and functional mobility 2/2 decreased activity tolerance. Pt currently requires SUPERVISION bed mobility. CGA + HHA sit<>stand and ~30 ft mobility. SBA don B socks seated EOB, posterior lean with dynamic activity. Pt would benefit from skilled OT to address noted impairments and functional limitations. Upon hospital discharge, recommend follow up therapy.   Recommendations for follow up therapy are one component of a multi-disciplinary discharge planning process, led by the attending physician.  Recommendations may be updated based on patient status, additional functional criteria and insurance authorization.   Assistance Recommended at Discharge Intermittent Supervision/Assistance  Patient can return home with the following A little help with walking and/or transfers;Help with stairs or ramp for entrance    Functional Status Assessment  Patient has had a recent decline in their functional status and demonstrates the ability to make significant improvements in function in a reasonable and predictable amount of time.  Equipment Recommendations  BSC/3in1    Recommendations for Other Services       Precautions / Restrictions Precautions Precautions: Fall Restrictions Weight Bearing Restrictions: No      Mobility Bed Mobility Overal bed mobility: Needs Assistance Bed Mobility: Supine to Sit, Sit to Supine     Supine to  sit: Supervision Sit to supine: Supervision        Transfers Overall transfer level: Needs assistance Equipment used: 1 person hand held assist Transfers: Sit to/from Stand Sit to Stand: Min guard                  Balance Overall balance assessment: Needs assistance Sitting-balance support: No upper extremity supported, Feet supported Sitting balance-Leahy Scale: Fair     Standing balance support: Single extremity supported Standing balance-Leahy Scale: Fair                             ADL either performed or assessed with clinical judgement   ADL Overall ADL's : Needs assistance/impaired                                       General ADL Comments: CGA + HHA simulated toilet t/f. SBA don B socks seated EOB, posterior lean with dynamic activity      Pertinent Vitals/Pain Pain Assessment Pain Assessment: No/denies pain     Hand Dominance     Extremity/Trunk Assessment Upper Extremity Assessment Upper Extremity Assessment: Overall WFL for tasks assessed   Lower Extremity Assessment Lower Extremity Assessment: Generalized weakness       Communication Communication Communication: HOH   Cognition Arousal/Alertness: Awake/alert Behavior During Therapy: WFL for tasks assessed/performed Overall Cognitive Status: History of cognitive impairments - at baseline  Home Living Family/patient expects to be discharged to:: Private residence Living Arrangements: Spouse/significant other Available Help at Discharge: Family;Available 24 hours/day Type of Home: House Home Access: Stairs to enter [employer] of Steps: 1+1   Home Layout: One level     Bathroom Shower/Tub: Occupational psychologist: Handicapped height     Home Equipment: Shower seat;Grab bars - tub/shower;Rolling Environmental consultant (2 wheels)          Prior Functioning/Environment Prior  Level of Function : Independent/Modified Independent             Mobility Comments: no AD use ADLs Comments: pt vacuums, assist for cooking and buttons        OT Problem List: Decreased strength;Decreased activity tolerance;Impaired balance (sitting and/or standing)      OT Treatment/Interventions: Self-care/ADL training;Therapeutic exercise;Energy conservation;DME and/or AE instruction;Therapeutic activities;Patient/family education;Balance training    OT Goals(Current goals can be found in the care plan section) Acute Rehab OT Goals Patient Stated Goal: to go home OT Goal Formulation: With patient/family Time For Goal Achievement: 08/31/22 Potential to Achieve Goals: Good ADL Goals Pt Will Perform Grooming: Independently;standing Pt Will Perform Lower Body Dressing: Independently;sit to/from stand Pt Will Transfer to Toilet: Independently;ambulating;regular height toilet  OT Frequency: Min 2X/week    Co-evaluation PT/OT/SLP Co-Evaluation/Treatment: Yes Reason for Co-Treatment: Necessary to address cognition/behavior during functional activity PT goals addressed during session: Mobility/safety with mobility OT goals addressed during session: ADL's and self-care      AM-PAC OT "6 Clicks" Daily Activity     Outcome Measure Help from another person eating meals?: None Help from another person taking care of personal grooming?: A Little Help from another person toileting, which includes using toliet, bedpan, or urinal?: A Little Help from another person bathing (including washing, rinsing, drying)?: A Little Help from another person to put on and taking off regular upper body clothing?: None Help from another person to put on and taking off regular lower body clothing?: A Little 6 Click Score: 20   End of Session    Activity Tolerance: Patient tolerated treatment well Patient left: in bed;with call bell/phone within reach;with bed alarm set;with family/visitor  present  OT Visit Diagnosis: Other abnormalities of gait and mobility (R26.89);Muscle weakness (generalized) (M62.81)                Time: [id number] OT Time Calculation (min): 12 min Charges:  OT General Charges $OT Visit: 1 Visit OT Evaluation $OT Eval Low Complexity: 1 Low  Dessie Coma, M.S. OTR/L  08/17/22, 10:52 AM  ascom [phone]

## 2022-08-17 NOTE — ED Notes (Signed)
Pt minimal assist with standing and sitting on bedside commode.

## 2022-08-17 NOTE — Progress Notes (Signed)
PROGRESS NOTE  [name]    DOB: [birth year], 89 y.o.  [id number]    Code Status: Full Code   DOA: 08/16/2022   LOS: 0   Brief hospital course  [name] is a 89 y.o. male with a PMH significant for GERD, vitamin D deficiency.  They presented from home to the ED on 08/16/2022 with weakness x 1 days. He lives with his wife and daughter. His other daughter, who doesn't live with him gives history. She states that he was in his normal state in the morning of presentation and took a nap in his recliner as usual in the afternoon. After waking up, he was unable to stand due to weakness. They stated that he also had slurred speech. Once EMS arrived, he was speaking normally and at his normal mental state.  He describes having a "dripping nose" over the past several days but otherwise felt fine.  Patient states that he had 2 covid vaccines.  He is independent and ambulatory at baseline.   In the ED, it was found that they had temperature of 98.5, respiration rate 16, heart rate 68, blood pressure 127/80, SpO2 92% on room air .  Significant findings included sodium is 134, potassium 4.4, chloride 98, bicarb 27, BUN of 17, serum creatinine 0.95, nonfasting blood glucose 135, WBC 6.1, hemoglobin 13.7, platelets of 147. troponin was 35 and on repeat was 36. UA was negative for leukocytes and nitrates. Patient tested positive for COVID 19 infection.   Head CT negative for acute intracranial abnormalities. Incidental finding of 2 cm rounded fluid attenuation mass within the left nasal passage, likely a retention cyst.  They were initially treated with supportive care. Did not require oxygen.  Patient was admitted to medicine service for further workup and management of weakness as outlined in detail below.  08/17/22 -stable ORA. Is ambulating independently to and from the bathroom during encounter. Denies SOB  Assessment & Plan  Principal Problem:   Weakness Active Problems:   HLD  (hyperlipidemia)   Essential hypertension   GERD (gastroesophageal reflux disease)   MGUS (monoclonal gammopathy of unknown significance)   Dementia   Chronic heart failure with preserved ejection fraction (HFpEF)   Frequent PVCs   Cardiac amyloidosis   COVID-19 virus infection  Generalized Weakness  COVID-19 virus infection Patient is ambulatory and at baseline mental status per family in room. Non-oxygen dependent.  - PT/OT evaluation  - supportive care  - albuterol inhaler  - ipratropium nasal spray PRN  - honey for cough - dc telemetry- causing agitation to patient and he has been stable since admission.    Thrombocytopenia- platelets 147>107. No acute bleeding. Hgb WNL.    Chronic heart failure with preserved ejection fraction (HFpEF) Patient does not appear to be in exacerbation   Essential hypertension- well controlled  Body mass index is 20.37 kg/m.  VTE ppx: enoxaparin (LOVENOX) injection 40 mg Start: 08/17/22 0800 Place TED hose Start: 08/16/22 2251  Diet:     Diet   Diet Heart Room service appropriate? Yes; Fluid consistency: Thin   Consultants: None   Subjective 08/17/22    Pt reports feeling well. He denies chest pain or shortness of breath. He did get a little tired walking all around today with therapy.  Endorses some nasal drainage.  Confirmed with daughter in room that he is full code.    Objective   Vitals:   08/17/22 0630 08/17/22 0646 08/17/22 0647 08/17/22 0743  BP: 120/70  137/71  Pulse: (!) 55 (!) 54 (!) 51 (!) 54  Resp: 16 18 17 16   Temp:    98.1 F (36.7 C)  TempSrc:    Oral  SpO2: 96% 98% 100% 100%  Weight:      Height:       No intake or output data in the 24 hours ending 08/17/22 0802 Filed Weights   08/16/22 1900  Weight: 60.8 kg    Physical Exam:  General: awake, alert, NAD HEENT: atraumatic, clear conjunctiva, anicteric sclera, MMM, hard of hearing Respiratory: normal respiratory effort. Rhonchi on exam. No wheezing.   Cardiovascular: quick capillary refill, normal S1/S2, RRR, no JVD, murmurs Nervous: A&O x3. no gross focal neurologic deficits, normal speech Extremities: moves all equally, no edema, normal tone Skin: dry, intact, normal temperature, normal color. No rashes, lesions or ulcers on exposed skin Psychiatry: normal mood, congruent affect  Labs   I have personally reviewed the following labs and imaging studies CBC    Component Value Date/Time   WBC 5.1 08/17/2022 0457   RBC 4.22 08/17/2022 0457   HGB 13.7 08/17/2022 0457   HGB 15.0 04/22/2022 1207   HCT 39.7 08/17/2022 0457   HCT 42.8 04/22/2022 1207   PLT 107 (L) 08/17/2022 0457   PLT 193 04/22/2022 1207   MCV 94.1 08/17/2022 0457   MCV 95 04/22/2022 1207   MCH 32.5 08/17/2022 0457   MCHC 34.5 08/17/2022 0457   RDW 12.0 08/17/2022 0457   RDW 12.2 04/22/2022 1207   LYMPHSABS 2.2 04/22/2022 1207   MONOABS 0.5 01/28/2022 1055   EOSABS 0.2 04/22/2022 1207   BASOSABS 0.0 04/22/2022 1207      Latest Ref Rng & Units 08/17/2022    4:57 AM 08/16/2022    7:13 PM 06/01/2022    1:11 PM  BMP  Glucose 70 - 99 mg/dL 101  135  96   BUN 8 - 23 mg/dL 14  17  18    Creatinine 0.61 - 1.24 mg/dL 0.80  0.95  0.93   BUN/Creat Ratio 10 - 24   19   Sodium 135 - 145 mmol/L 135  134  139   Potassium 3.5 - 5.1 mmol/L 3.7  4.4  4.7   Chloride 98 - 111 mmol/L 101  98  100   CO2 22 - 32 mmol/L 25  27  25    Calcium 8.9 - 10.3 mg/dL 8.9  9.0  9.5     CT Head Wo Contrast  Result Date: 08/16/2022 CLINICAL DATA:  Weakness EXAM: CT HEAD WITHOUT CONTRAST TECHNIQUE: Contiguous axial images were obtained from the base of the skull through the vertex without intravenous contrast. RADIATION DOSE REDUCTION: This exam was performed according to the departmental dose-optimization program which includes automated exposure control, adjustment of the mA and/or kV according to patient size and/or use of iterative reconstruction technique. COMPARISON:  03/09/2021 FINDINGS:  Brain: Normal anatomic configuration. Parenchymal volume loss is commensurate with the patient's age. Progressive, mild periventricular white matter changes are present likely reflecting the sequela of small vessel ischemia. No abnormal intra or extra-axial mass lesion or fluid collection. No abnormal mass effect or midline shift. No evidence of acute intracranial hemorrhage or infarct. Ventricular size is normal. Cerebellum unremarkable. Vascular: No asymmetric hyperdense vasculature at the skull base. Skull: Intact Sinuses/Orbits: There is opacification of several right ethmoid air cells. Small mucous retention cyst or polyp noted within the left maxillary sinus. A 2 cm rounded fluid attenuation mass is seen within the  left nasal passage,, likely a retention cyst. Remaining paranasal. Sinuses are clear. Orbits are unremarkable. Other: Mastoid air cells and middle ear cavities are clear. IMPRESSION: 1. No acute intracranial abnormality. 2. Progressive senescent change. 3. Mild paranasal sinus disease. 4. 2 cm rounded fluid attenuation mass within the left nasal passage, likely a retention cyst. Electronically Signed   By: Fidela Salisbury M.D.   On: 08/16/2022 20:22   DG Chest Port 1 View  Result Date: 08/16/2022 CLINICAL DATA:  Generalized weakness EXAM: PORTABLE CHEST 1 VIEW COMPARISON:  09/23/2017 FINDINGS: Hyperinflation. No acute airspace disease or effusion. Stable cardiomediastinal silhouette. No pneumothorax. IMPRESSION: No active disease. Hyperinflation. Electronically Signed   By: Donavan Foil M.D.   On: 08/16/2022 20:19    Disposition Plan & Communication  Patient status: Observation  Admitted From: Home Planned disposition location: Home Anticipated discharge date: 4/3 pending stabilization of respiratory status with exertion. High risk of deterioration due to advanced age  Family Communication: daughter and brother in law in room    Author: Richarda Osmond, DO [facility] 08/17/2022, 8:02 AM   Available by Epic secure chat 7AM-7PM. If 7PM-7AM, please contact night-coverage.  [facility] contact information found on [email or web address].

## 2022-08-17 NOTE — ED Notes (Signed)
Provider Foust, NP notified regarding pt chronic bradycardia, with HR as low as 49.  Provider notified, pt is asymptomatic with BP WNL: BP: 120 70

## 2022-08-17 NOTE — Evaluation (Signed)
Physical Therapy Evaluation Patient Details Name: [name] MRN: [id number] DOB: August 18, 1923 Today's Date: 08/17/2022  History of Present Illness  [name] is a 89 year old male with history of GERD, vitamin D deficiency, who presents to the emergency department for chief concern of sudden onset of weakness and fall at home. Found to be COVID positive.  Clinical Impression  Patient received in bed, he is alert, pleasant, making jokes. Wife at bedside. Patient is HOH. He is supervision for bed mobility and min guard for transfers and ambulation. Patient appears to be close to baseline level of mobility. He will continue to benefit from skilled PT to ensure safety with mobility for return home.       Recommendations for follow up therapy are one component of a multi-disciplinary discharge planning process, led by the attending physician.  Recommendations may be updated based on patient status, additional functional criteria and insurance authorization.  Follow Up Recommendations       Assistance Recommended at Discharge Intermittent Supervision/Assistance  Patient can return home with the following  A little help with walking and/or transfers;A little help with bathing/dressing/bathroom;Assist for transportation;Help with stairs or ramp for entrance    Equipment Recommendations None recommended by PT  Recommendations for Other Services       Functional Status Assessment Patient has had a recent decline in their functional status and demonstrates the ability to make significant improvements in function in a reasonable and predictable amount of time.     Precautions / Restrictions Precautions Precautions: Fall Restrictions Weight Bearing Restrictions: No      Mobility  Bed Mobility Overal bed mobility: Needs Assistance Bed Mobility: Supine to Sit, Sit to Supine     Supine to sit: Supervision Sit to supine: Supervision        Transfers Overall transfer level:  Needs assistance Equipment used: 1 person hand held assist Transfers: Sit to/from Stand Sit to Stand: Min guard                Ambulation/Gait Ambulation/Gait assistance: Min guard Gait Distance (Feet): 30 Feet Assistive device: 1 person hand held assist Gait Pattern/deviations: Step-through pattern, Decreased step length - right, Decreased step length - left, Trunk flexed Gait velocity: decr     General Gait Details: patient generally steady with ambulation  Stairs            Wheelchair Mobility    Modified Rankin (Stroke Patients Only)       Balance Overall balance assessment: Needs assistance Sitting-balance support: Feet supported Sitting balance-Leahy Scale: Good     Standing balance support: Single extremity supported, During functional activity Standing balance-Leahy Scale: Good                               Pertinent Vitals/Pain Pain Assessment Pain Assessment: No/denies pain    Home Living Family/patient expects to be discharged to:: Private residence Living Arrangements: Spouse/significant other Available Help at Discharge: Family;Available 24 hours/day Type of Home: House Home Access: Stairs to enter   [employer] of Steps: 1+1   Home Layout: One level Home Equipment: Shower seat;Grab bars - tub/shower;Rolling Environmental consultant (2 wheels)      Prior Function Prior Level of Function : Independent/Modified Independent             Mobility Comments: no AD use ADLs Comments: pt vacuums, assist for cooking and buttons     Hand Dominance  Extremity/Trunk Assessment   Upper Extremity Assessment Upper Extremity Assessment: Defer to OT evaluation    Lower Extremity Assessment Lower Extremity Assessment: Generalized weakness    Cervical / Trunk Assessment Cervical / Trunk Assessment: Kyphotic  Communication   Communication: HOH  Cognition Arousal/Alertness: Awake/alert Behavior During Therapy: WFL for  tasks assessed/performed Overall Cognitive Status: History of cognitive impairments - at baseline                                          General Comments      Exercises     Assessment/Plan    PT Assessment Patient needs continued PT services  PT Problem List Decreased strength;Decreased activity tolerance;Decreased balance;Decreased mobility;Decreased cognition       PT Treatment Interventions Gait training;Stair training;Functional mobility training;Therapeutic activities;Patient/family education    PT Goals (Current goals can be found in the Care Plan section)  Acute Rehab PT Goals Patient Stated Goal: to go home PT Goal Formulation: With patient/family Time For Goal Achievement: 08/24/22 Potential to Achieve Goals: Good    Frequency Min 2X/week     Co-evaluation PT/OT/SLP Co-Evaluation/Treatment: Yes Reason for Co-Treatment: Necessary to address cognition/behavior during functional activity PT goals addressed during session: Mobility/safety with mobility;Balance OT goals addressed during session: ADL's and self-care       AM-PAC PT "6 Clicks" Mobility  Outcome Measure Help needed turning from your back to your side while in a flat bed without using bedrails?: None Help needed moving from lying on your back to sitting on the side of a flat bed without using bedrails?: None Help needed moving to and from a bed to a chair (including a wheelchair)?: A Little Help needed standing up from a chair using your arms (e.g., wheelchair or bedside chair)?: A Little Help needed to walk in hospital room?: A Little Help needed climbing 3-5 steps with a railing? : A Little 6 Click Score: 20    End of Session   Activity Tolerance: Patient tolerated treatment well Patient left: in bed;with call bell/phone within reach;with family/visitor present Nurse Communication: Mobility status PT Visit Diagnosis: Unsteadiness on feet (R26.81);Muscle weakness (generalized)  (M62.81);Difficulty in walking, not elsewhere classified (R26.2)    Time: [id number] PT Time Calculation (min) (ACUTE ONLY): 8 min   Charges:   PT Evaluation $PT Eval Low Complexity: 1 Low          [name], PT, GCS 08/17/22,11:07 AM

## 2022-08-18 ENCOUNTER — Encounter: Payer: Self-pay | Admitting: Family Medicine

## 2022-08-18 DIAGNOSIS — R531 Weakness: Secondary | ICD-10-CM | POA: Diagnosis not present

## 2022-08-18 LAB — BASIC METABOLIC PANEL
Anion gap: 8 (ref 5–15)
BUN: 15 mg/dL (ref 8–23)
CO2: 26 mmol/L (ref 22–32)
Calcium: 8.9 mg/dL (ref 8.9–10.3)
Chloride: 100 mmol/L (ref 98–111)
Creatinine, Ser: 0.98 mg/dL (ref 0.61–1.24)
GFR, Estimated: >60 mL/min (ref >60)
Glucose, Bld: 107 mg/dL — ABNORMAL HIGH (ref 70–99)
Potassium: 3.8 mmol/L (ref 3.5–5.1)
Sodium: 134 mmol/L — ABNORMAL LOW (ref 135–145)

## 2022-08-18 LAB — CBC
HCT: 41.6 % (ref 39.0–52.0)
Hemoglobin: 14.6 g/dL (ref 13.0–17.0)
MCH: 32.4 pg (ref 26.0–34.0)
MCHC: 35.1 g/dL (ref 30.0–36.0)
MCV: 92.2 fL (ref 80.0–100.0)
Platelets: 133 10*3/uL — ABNORMAL LOW (ref 150–400)
RBC: 4.51 MIL/uL (ref 4.22–5.81)
RDW: 11.9 % (ref 11.5–15.5)
WBC: 5.4 10*3/uL (ref 4.0–10.5)
nRBC: 0 % (ref 0.0–0.2)

## 2022-08-18 MED ORDER — ADULT MULTIVITAMIN W/MINERALS CH
1.0000 | ORAL_TABLET | Freq: Every day | ORAL | Status: DC
  Administered 2022-08-18: 1 via ORAL
  Filled 2022-08-18: qty 1

## 2022-08-18 MED ORDER — ENSURE ENLIVE PO LIQD
237.0000 mL | Freq: Two times a day (BID) | ORAL | Status: DC
  Administered 2022-08-18: 237 mL via ORAL

## 2022-08-18 MED ORDER — IPRATROPIUM BROMIDE 0.06 % NA SOLN: 2.0000 | Freq: Three times a day (TID) | NASAL | 0 refills | Status: DC | PRN

## 2022-08-18 MED ORDER — ENSURE ENLIVE PO LIQD: 237.0000 mL | Freq: Two times a day (BID) | ORAL | 12 refills | Status: DC

## 2022-08-18 MED ORDER — ALBUTEROL SULFATE HFA 108 (90 BASE) MCG/ACT IN AERS: 1.0000 | INHALATION_SPRAY | Freq: Four times a day (QID) | RESPIRATORY_TRACT | 0 refills | Status: DC | PRN

## 2022-08-18 MED ORDER — ADULT MULTIVITAMIN W/MINERALS CH: 1.0000 | ORAL_TABLET | Freq: Every day | ORAL | Status: DC

## 2022-08-18 NOTE — Care Management Obs Status (Signed)
[facility] NOTIFICATION   Patient Details  Name: [name] MRN: [id number] Date of Birth: [birth year]   Medicare Observation Status Notification Given:  Yes    Amareon Phung, LCSW 08/18/2022, 9:48 AM

## 2022-08-18 NOTE — Discharge Summary (Signed)
Physician Discharge Summary   Patient: [name] MRN: [id number]  DOB: 1923-09-09   Admit:     Date of Admission: 08/16/2022 Admitted from: home   Discharge: Date of discharge: 08/18/22 Disposition: Home health Condition at discharge: fair  CODE STATUS: FULL CODE      Discharge Physician: Emeterio Reeve, DO [facility]     PCP: Tonia Ghent, MD  Recommendations for Outpatient Follow-up:  Follow up with PCP Tonia Ghent, MD in 1-2 weeks Please obtain labs/tests: CBC, BMP in 1-2 weeks Please follow up on the following pending results: none PCP AND OTHER OUTPATIENT PROVIDERS: SEE BELOW FOR SPECIFIC DISCHARGE INSTRUCTIONS PRINTED FOR PATIENT IN ADDITION TO GENERIC AVS PATIENT INFO     Discharge Instructions     Diet - low sodium heart healthy   Complete by: As directed    Increase activity slowly   Complete by: As directed          Discharge Diagnoses: Principal Problem:   Weakness Active Problems:   HLD (hyperlipidemia)   Essential hypertension   GERD (gastroesophageal reflux disease)   MGUS (monoclonal gammopathy of unknown significance)   Dementia   Chronic heart failure with preserved ejection fraction (HFpEF)   Frequent PVCs   Cardiac amyloidosis   COVID-19 virus infection   COVID-19       Hospital Course: [name] is a 89 y.o. male with a PMH significant for GERD, vitamin D deficiency.   They presented from home to the ED on 08/16/2022 with weakness x 1 days. He lives with his wife and daughter. His other daughter, who doesn't live with him gives history. She states that he was in his normal state in the morning of presentation and took a nap in his recliner as usual in the afternoon. After waking up, he was unable to stand due to weakness. They stated that he also had slurred speech. Once EMS arrived, he was speaking normally and at his normal mental state.  He describes having a "dripping nose" over the past several  days but otherwise felt fine.  Patient states that he had 2 covid vaccines.  He is independent and ambulatory at baseline.    In the ED, it was found that they had temperature of 98.5, respiration rate 16, heart rate 68, blood pressure 127/80, SpO2 92% on room air .  Significant findings included sodium is 134, potassium 4.4, chloride 98, bicarb 27, BUN of 17, serum creatinine 0.95, nonfasting blood glucose 135, WBC 6.1, hemoglobin 13.7, platelets of 147. troponin was 35 and on repeat was 36. UA was negative for leukocytes and nitrates. Patient tested positive for COVID 19 infection.   Head CT negative for acute intracranial abnormalities. Incidental finding of 2 cm rounded fluid attenuation mass within the left nasal passage, likely a retention cyst.   They were initially treated with supportive care. Did not require oxygen.   Patient was admitted to medicine service for further workup and management of weakness    08/17/22 -stable RA. Is ambulating independently to and from the bathroom during encounter. Denies SOB awaiting PT/OT  08/18/22 -recs for [name], pt remains stable for discharge      Consultants:  none  Procedures: none      ASSESSMENT & PLAN:   Principal Problem:   Weakness Active Problems:   HLD (hyperlipidemia)   Essential hypertension   GERD (gastroesophageal reflux disease)   MGUS (monoclonal gammopathy of unknown significance)   Dementia  Chronic heart failure with preserved ejection fraction (HFpEF)   Frequent PVCs   Cardiac amyloidosis   COVID-19 virus infection   COVID-19   Weakness Presumed secondary to COVID-19 infection PT, OT, TOC consulted  Resume home health   COVID-19 virus infection Patient is maintaining airway, SpO2 is greater than 92% on room air No indication for antiviral medication at this time Continue monitoring  Essential hypertension Normotensive  Outpatient f/u  Chronic heart failure with preserved ejection fraction  (HFpEF) Patient is compensated Outpatient f/u         Discharge Instructions  Allergies as of 08/18/2022       Reactions   Atorvastatin    REACTION: PALPITATIONS   Ezetimibe-simvastatin    REACTION: HEADACHES   Sildenafil    syncope   Simvastatin    REACTION: HEADACHE        Medication List     TAKE these medications    albuterol 108 (90 Base) MCG/ACT inhaler Commonly known as: VENTOLIN HFA Inhale 1-2 puffs into the lungs every 6 (six) hours as needed for wheezing or shortness of breath.   aspirin 81 MG tablet Take 81 mg by mouth daily.   feeding supplement Liqd Take 237 mLs by mouth 2 (two) times daily between meals.   ipratropium 0.06 % nasal spray Commonly known as: ATROVENT Place 2 sprays into both nostrils 3 (three) times daily as needed for rhinitis.   loratadine 10 MG tablet Commonly known as: CLARITIN TAKE 1 TABLET BY MOUTH ONCE DAILY   multivitamin with minerals Tabs tablet Take 1 tablet by mouth daily. Start taking on: August 19, 2022   pantoprazole 40 MG tablet Commonly known as: PROTONIX TAKE 1 TABLET BY MOUTH ONCE DAILY   sertraline 25 MG tablet Commonly known as: ZOLOFT Take 25 mg by mouth daily.   Vitamin B12 1000 MCG Tbcr Take 1 tablet by mouth daily.          Allergies  Allergen Reactions   Atorvastatin     REACTION: PALPITATIONS   Ezetimibe-Simvastatin     REACTION: HEADACHES   Sildenafil     syncope   Simvastatin     REACTION: HEADACHE     Subjective: pt denies SOB, CP. He is ambulating and tolerating po intake.    Discharge Exam: BP (!) 140/86 (BP Location: Left Arm)   Pulse 69   Temp 98.5 F (36.9 C) (Oral)   Resp 16   Ht 5\' 8"  (1.727 m)   Wt 60.8 kg   SpO2 99%   BMI 20.37 kg/m  General: Pt is alert, awake, not in acute distress Cardiovascular: Reg rate, occasional likely PVC audible, no rubs, no gallops Respiratory: CTA bilaterally, no wheezing, no rhonchi Abdominal: Soft, NT, ND, bowel sounds  WNL Extremities: no edema, no cyanosis     The results of significant diagnostics from this hospitalization (including imaging, microbiology, ancillary and laboratory) are listed below for reference.     Microbiology: Recent Results (from the past 240 hour(s))  Resp panel by RT-PCR (RSV, Flu A&B, Covid) Anterior Nasal Swab     Status: Abnormal   Collection Time: 08/16/22  9:44 PM   Specimen: Anterior Nasal Swab  Result Value Ref Range Status   SARS Coronavirus 2 by RT PCR POSITIVE (A) NEGATIVE Final    Comment: (NOTE) SARS-CoV-2 target nucleic acids are DETECTED.  The SARS-CoV-2 RNA is generally detectable in upper respiratory specimens during the acute phase of infection. Positive results are indicative of the presence of  the identified virus, but do not rule out bacterial infection or co-infection with other pathogens not detected by the test. Clinical correlation with patient history and other diagnostic information is necessary to determine patient infection status. The expected result is Negative.  Fact Sheet for Patients: EntrepreneurPulse.com.au  Fact Sheet for Healthcare Providers: IncredibleEmployment.be  This test is not yet approved or cleared by the [place] FDA and  has been authorized for detection and/or diagnosis of SARS-CoV-2 by FDA under an Emergency Use Authorization (EUA).  This EUA will remain in effect (meaning this test can be used) for the duration of  the COVID-19 declaration under Section 564(b)(1) of the A ct, 21 U.S.C. section 360bbb-3(b)(1), unless the authorization is terminated or revoked sooner.     Influenza A by PCR NEGATIVE NEGATIVE Final   Influenza B by PCR NEGATIVE NEGATIVE Final    Comment: (NOTE) The Xpert Xpress SARS-CoV-2/FLU/RSV plus assay is intended as an aid in the diagnosis of influenza from Nasopharyngeal swab specimens and should not be used as a sole basis for treatment. Nasal  washings and aspirates are unacceptable for Xpert Xpress SARS-CoV-2/FLU/RSV testing.  Fact Sheet for Patients: EntrepreneurPulse.com.au  Fact Sheet for Healthcare Providers: IncredibleEmployment.be  This test is not yet approved or cleared by the [place] FDA and has been authorized for detection and/or diagnosis of SARS-CoV-2 by FDA under an Emergency Use Authorization (EUA). This EUA will remain in effect (meaning this test can be used) for the duration of the COVID-19 declaration under Section 564(b)(1) of the Act, 21 U.S.C. section 360bbb-3(b)(1), unless the authorization is terminated or revoked.     Resp Syncytial Virus by PCR NEGATIVE NEGATIVE Final    Comment: (NOTE) Fact Sheet for Patients: EntrepreneurPulse.com.au  Fact Sheet for Healthcare Providers: IncredibleEmployment.be  This test is not yet approved or cleared by the [place] FDA and has been authorized for detection and/or diagnosis of SARS-CoV-2 by FDA under an Emergency Use Authorization (EUA). This EUA will remain in effect (meaning this test can be used) for the duration of the COVID-19 declaration under Section 564(b)(1) of the Act, 21 U.S.C. section 360bbb-3(b)(1), unless the authorization is terminated or revoked.  Performed at [facility], [address] [place], [place] [address]      Labs: BNP (last 3 results) No results for input(s): "BNP" in the last 8760 hours. Basic Metabolic Panel: Recent Labs  Lab 08/16/22 1913 08/17/22 0457 08/18/22 0455  NA 134* 135 134*  K 4.4 3.7 3.8  CL 98 101 100  CO2 27 25 26   GLUCOSE 135* 101* 107*  BUN 17 14 15   CREATININE 0.95 0.80 0.98  CALCIUM 9.0 8.9 8.9   Liver Function Tests: No results for input(s): "AST", "ALT", "ALKPHOS", "BILITOT", "PROT", "ALBUMIN" in the last 168 hours. No results for input(s): "LIPASE", "AMYLASE" in the last 168 hours. No  results for input(s): "AMMONIA" in the last 168 hours. CBC: Recent Labs  Lab 08/16/22 1913 08/17/22 0457 08/18/22 0455  WBC 6.1 5.1 5.4  HGB 13.7 13.7 14.6  HCT 40.2 39.7 41.6  MCV 94.6 94.1 92.2  PLT 147* 107* 133*   Cardiac Enzymes: No results for input(s): "CKTOTAL", "CKMB", "CKMBINDEX", "TROPONINI" in the last 168 hours. BNP: Invalid input(s): "POCBNP" CBG: No results for input(s): "GLUCAP" in the last 168 hours. D-Dimer No results for input(s): "DDIMER" in the last 72 hours. Hgb A1c No results for input(s): "HGBA1C" in the last 72 hours. Lipid Profile No results for input(s): "CHOL", "HDL", "  [place]", "TRIG", "CHOLHDL", "LDLDIRECT" in the last 72 hours. Thyroid function studies No results for input(s): "TSH", "T4TOTAL", "T3FREE", "THYROIDAB" in the last 72 hours.  Invalid input(s): "FREET3" Anemia work up No results for input(s): "VITAMINB12", "FOLATE", "FERRITIN", "TIBC", "IRON", "RETICCTPCT" in the last 72 hours. Urinalysis    Component Value Date/Time   COLORURINE YELLOW (A) 08/16/2022 2144   APPEARANCEUR CLEAR (A) 08/16/2022 2144   APPEARANCEUR Clear 04/22/2022 1207   LABSPEC 1.023 08/16/2022 2144   PHURINE 6.0 08/16/2022 2144   GLUCOSEU NEGATIVE 08/16/2022 2144   HGBUR NEGATIVE 08/16/2022 2144   BILIRUBINUR NEGATIVE 08/16/2022 2144   BILIRUBINUR Negative 04/22/2022 [place] NEGATIVE 08/16/2022 2144   PROTEINUR 30 (A) 08/16/2022 2144   NITRITE NEGATIVE 08/16/2022 2144   LEUKOCYTESUR NEGATIVE 08/16/2022 2144   Sepsis Labs Recent Labs  Lab 08/16/22 1913 08/17/22 0457 08/18/22 0455  WBC 6.1 5.1 5.4   Microbiology Recent Results (from the past 240 hour(s))  Resp panel by RT-PCR (RSV, Flu A&B, Covid) Anterior Nasal Swab     Status: Abnormal   Collection Time: 08/16/22  9:44 PM   Specimen: Anterior Nasal Swab  Result Value Ref Range Status   SARS Coronavirus 2 by RT PCR POSITIVE (A) NEGATIVE Final    Comment: (NOTE) SARS-CoV-2 target nucleic  acids are DETECTED.  The SARS-CoV-2 RNA is generally detectable in upper respiratory specimens during the acute phase of infection. Positive results are indicative of the presence of the identified virus, but do not rule out bacterial infection or co-infection with other pathogens not detected by the test. Clinical correlation with patient history and other diagnostic information is necessary to determine patient infection status. The expected result is Negative.  Fact Sheet for Patients: EntrepreneurPulse.com.au  Fact Sheet for Healthcare Providers: IncredibleEmployment.be  This test is not yet approved or cleared by the [place] FDA and  has been authorized for detection and/or diagnosis of SARS-CoV-2 by FDA under an Emergency Use Authorization (EUA).  This EUA will remain in effect (meaning this test can be used) for the duration of  the COVID-19 declaration under Section 564(b)(1) of the A ct, 21 U.S.C. section 360bbb-3(b)(1), unless the authorization is terminated or revoked sooner.     Influenza A by PCR NEGATIVE NEGATIVE Final   Influenza B by PCR NEGATIVE NEGATIVE Final    Comment: (NOTE) The Xpert Xpress SARS-CoV-2/FLU/RSV plus assay is intended as an aid in the diagnosis of influenza from Nasopharyngeal swab specimens and should not be used as a sole basis for treatment. Nasal washings and aspirates are unacceptable for Xpert Xpress SARS-CoV-2/FLU/RSV testing.  Fact Sheet for Patients: EntrepreneurPulse.com.au  Fact Sheet for Healthcare Providers: IncredibleEmployment.be  This test is not yet approved or cleared by the [place] FDA and has been authorized for detection and/or diagnosis of SARS-CoV-2 by FDA under an Emergency Use Authorization (EUA). This EUA will remain in effect (meaning this test can be used) for the duration of the COVID-19 declaration under Section 564(b)(1) of  the Act, 21 U.S.C. section 360bbb-3(b)(1), unless the authorization is terminated or revoked.     Resp Syncytial Virus by PCR NEGATIVE NEGATIVE Final    Comment: (NOTE) Fact Sheet for Patients: EntrepreneurPulse.com.au  Fact Sheet for Healthcare Providers: IncredibleEmployment.be  This test is not yet approved or cleared by the [place] FDA and has been authorized for detection and/or diagnosis of SARS-CoV-2 by FDA under an Emergency Use Authorization (EUA). This EUA will remain in effect (meaning this test can be used)  for the duration of the COVID-19 declaration under Section 564(b)(1) of the Act, 21 U.S.C. section 360bbb-3(b)(1), unless the authorization is terminated or revoked.  Performed at [facility], [place], [place] [address]    Imaging CT Head Wo Contrast  Result Date: 08/16/2022 CLINICAL DATA:  Weakness EXAM: CT HEAD WITHOUT CONTRAST TECHNIQUE: Contiguous axial images were obtained from the base of the skull through the vertex without intravenous contrast. RADIATION DOSE REDUCTION: This exam was performed according to the departmental dose-optimization program which includes automated exposure control, adjustment of the mA and/or kV according to patient size and/or use of iterative reconstruction technique. COMPARISON:  03/09/2021 FINDINGS: Brain: Normal anatomic configuration. Parenchymal volume loss is commensurate with the patient's age. Progressive, mild periventricular white matter changes are present likely reflecting the sequela of small vessel ischemia. No abnormal intra or extra-axial mass lesion or fluid collection. No abnormal mass effect or midline shift. No evidence of acute intracranial hemorrhage or infarct. Ventricular size is normal. Cerebellum unremarkable. Vascular: No asymmetric hyperdense vasculature at the skull base. Skull: Intact Sinuses/Orbits: There is opacification of several right  ethmoid air cells. Small mucous retention cyst or polyp noted within the left maxillary sinus. A 2 cm rounded fluid attenuation mass is seen within the left nasal passage,, likely a retention cyst. Remaining paranasal. Sinuses are clear. Orbits are unremarkable. Other: Mastoid air cells and middle ear cavities are clear. IMPRESSION: 1. No acute intracranial abnormality. 2. Progressive senescent change. 3. Mild paranasal sinus disease. 4. 2 cm rounded fluid attenuation mass within the left nasal passage, likely a retention cyst. Electronically Signed   By: Fidela Salisbury M.D.   On: 08/16/2022 20:22   DG Chest Port 1 View  Result Date: 08/16/2022 CLINICAL DATA:  Generalized weakness EXAM: PORTABLE CHEST 1 VIEW COMPARISON:  09/23/2017 FINDINGS: Hyperinflation. No acute airspace disease or effusion. Stable cardiomediastinal silhouette. No pneumothorax. IMPRESSION: No active disease. Hyperinflation. Electronically Signed   By: Donavan Foil M.D.   On: 08/16/2022 20:19      Time coordinating discharge: over 30 minutes  SIGNED:  Emeterio Reeve DO [facility]

## 2022-08-18 NOTE — Progress Notes (Signed)
Physical Therapy Treatment Patient Details Name: [name] MRN: [id number] DOB: 11/26/1923 Today's Date: 08/18/2022   History of Present Illness [name] is a 89 year old male with history of GERD, vitamin D deficiency, who presents to the emergency department for chief concern of sudden onset of weakness and fall at home. Found to be COVID positive.    PT Comments    Patient received in recliner, just returned from bathroom. Wife present. He is agreeable to PT session. Patient stands with supervision. He requires cues for safe use of ad especially with turning, he tends to get outside and too far from RW. He ambulated 125 feet with min guard, quick pace. Patient will continue to benefit from skilled PT to improve strength and safety with mobility.      Recommendations for follow up therapy are one component of a multi-disciplinary discharge planning process, led by the attending physician.  Recommendations may be updated based on patient status, additional functional criteria and insurance authorization.  Follow Up Recommendations       Assistance Recommended at Discharge Intermittent Supervision/Assistance  Patient can return home with the following A little help with walking and/or transfers;A little help with bathing/dressing/bathroom;Assist for transportation;Help with stairs or ramp for entrance   Equipment Recommendations  None recommended by PT    Recommendations for Other Services       Precautions / Restrictions Precautions Precautions: Fall Restrictions Weight Bearing Restrictions: No     Mobility  Bed Mobility               General bed mobility comments: NT patient received in recliner    Transfers Overall transfer level: Needs assistance Equipment used: Rolling walker (2 wheels) Transfers: Sit to/from Stand Sit to Stand: Supervision                Ambulation/Gait Ambulation/Gait assistance: Min guard Gait Distance (Feet): 125  Feet Assistive device: Rolling walker (2 wheels) Gait Pattern/deviations: Step-through pattern, Trunk flexed Gait velocity: WNL     General Gait Details: patient requires cues for safety with AD and to use more regularly.   Stairs             Wheelchair Mobility    Modified Rankin (Stroke Patients Only)       Balance Overall balance assessment: Needs assistance Sitting-balance support: Feet supported Sitting balance-Leahy Scale: Good     Standing balance support: Bilateral upper extremity supported, During functional activity, Reliant on assistive device for balance Standing balance-Leahy Scale: Good                              Cognition Arousal/Alertness: Awake/alert Behavior During Therapy: WFL for tasks assessed/performed Overall Cognitive Status: History of cognitive impairments - at baseline                                          Exercises      General Comments        Pertinent Vitals/Pain Pain Assessment Pain Assessment: No/denies pain    Home Living                          Prior Function            PT Goals (current goals can now be found in the care plan section) Acute  Rehab PT Goals Patient Stated Goal: to go home PT Goal Formulation: With patient/family Time For Goal Achievement: 08/24/22 Potential to Achieve Goals: Good Progress towards PT goals: Progressing toward goals    Frequency    Min 2X/week      PT Plan Current plan remains appropriate    Co-evaluation              AM-PAC PT "6 Clicks" Mobility   Outcome Measure  Help needed turning from your back to your side while in a flat bed without using bedrails?: None Help needed moving from lying on your back to sitting on the side of a flat bed without using bedrails?: None Help needed moving to and from a bed to a chair (including a wheelchair)?: A Little Help needed standing up from a chair using your arms (e.g.,  wheelchair or bedside chair)?: A Little Help needed to walk in hospital room?: A Little Help needed climbing 3-5 steps with a railing? : A Little 6 Click Score: 20    End of Session   Activity Tolerance: Patient tolerated treatment well Patient left: in chair;with call bell/phone within reach;with family/visitor present Nurse Communication: Mobility status PT Visit Diagnosis: Unsteadiness on feet (R26.81);Muscle weakness (generalized) (M62.81);Difficulty in walking, not elsewhere classified (R26.2)     Time: [id number] PT Time Calculation (min) (ACUTE ONLY): 16 min  Charges:  $Gait Training: 8-22 mins                     Rai Severns, PT, GCS 08/18/22,11:31 AM

## 2022-08-18 NOTE — Progress Notes (Signed)
Initial Nutrition Assessment  DOCUMENTATION CODES:   Severe malnutrition in context of chronic illness  INTERVENTION:   -Liberalize diet to regular for wider variety of meal selections -MVI with minerals daily -Ensure Enlive po BID, each supplement provides 350 kcal and 20 grams of protein.   NUTRITION DIAGNOSIS:   Severe Malnutrition related to chronic illness (TIA) as evidenced by severe muscle depletion, severe fat depletion.  GOAL:   Patient will meet greater than or equal to 90% of their needs  MONITOR:   PO intake, Supplement acceptance  REASON FOR ASSESSMENT:   Malnutrition Screening Tool    ASSESSMENT:   Pt with history of GERD, vitamin D deficiency, who presents for chief concern of sudden onset of weakness.  Pt admitted with weakness and COVID-19.   Reviewed I/O's: -595 ml x 24 hours  UOP: 595 ml x 24 hours   Spoke with pt and wife at bedside. Pt is pleasant and in good spirits, laughing and joking with this RD. Pt lives with his wife and daughter who care for him. He reports he ate breakfast "because they forced me to " (raisin bran and chocolate chip cookies). Pt typically consumes 3 meals per day (Breakfast: cereal, banana, and cookies; Lunch: sloppy joes and soda; Dinner: meat, starch, and vegetable). Pt drinks 1-2 Ensure supplements daily and also enjoys eating ice cream, which he does daily.   Reviewed wt hx; pt has experienced a 2.3% wt loss over the past 3 months, which is not significant for time frame. Per wife, pt has had a lot of medical issues this year along with increasing weakness and fall. Pt UBW is around 149# and has lost 16# over the past year.   Discussed importance of good meal and supplement intake to promote healing. Pt amenable to supplements.   Medications reviewed and include vitamin B-12.   Labs reviewed: Na: 134.    NUTRITION - FOCUSED PHYSICAL EXAM:  Flowsheet Row Most Recent Value  Orbital Region Moderate depletion  Upper  Arm Region Severe depletion  Thoracic and Lumbar Region Moderate depletion  Buccal Region Severe depletion  Temple Region Moderate depletion  Clavicle Bone Region Severe depletion  Clavicle and Acromion Bone Region Severe depletion  Scapular Bone Region Severe depletion  Dorsal Hand Severe depletion  Patellar Region Severe depletion  Anterior Thigh Region Severe depletion  Posterior Calf Region Severe depletion  Edema (RD Assessment) None  Hair Reviewed  Eyes Reviewed  Mouth Reviewed  Skin Reviewed  Nails Reviewed       Diet Order:   Diet Order             Diet regular Room service appropriate? Yes; Fluid consistency: Thin  Diet effective now           Diet - low sodium heart healthy                   EDUCATION NEEDS:   Education needs have been addressed  Skin:  Skin Assessment: Reviewed RN Assessment  Last BM:  08/16/22  Height:   Ht Readings from Last 1 Encounters:  08/16/22 5\' 8"  (1.727 m)    Weight:   Wt Readings from Last 1 Encounters:  08/16/22 60.8 kg    Ideal Body Weight:  70 kg  BMI:  Body mass index is 20.37 kg/m.  Estimated Nutritional Needs:   Kcal:  1650-1850  Protein:  80-95 grams  Fluid:  > 1.6 L    Loistine Chance, RD, LDN, CDCES Registered  Dietitian II Certified Diabetes Care and Education Specialist Please refer to [facility] for RD and/or RD on-call/weekend/after hours pager

## 2022-08-18 NOTE — TOC Initial Note (Addendum)
Transition of Care [facility]) - Initial/Assessment Note    Patient Details  Name: [name] MRN: [id number] Date of Birth: 01-May-1924  Transition of Care [facility]) CM/SW Contact:    Gerilyn Pilgrim, LCSW Phone Number: 08/18/2022, 9:45 AM  Clinical Narrative:   CSW spoke with patients wife regarding [facility] referral. Wife agreeable as she states she needs additional assistance at home.Wife reports no preference for [facility].  Daughter lives with her but works during the day and this is where the wife is requesting additional assistance. Wife agreeable to referral to [facility]. Wife states that pt has a RW, wheelchair and a shower chair and does not need any additional DME. PT still active with Dr. Mateo Flow for primary care.                  [facility] accepted for PT/OT/RN,      Patient Goals and CMS Choice            Expected Discharge Plan and Services                                              Prior Living Arrangements/Services                       Activities of Daily Living Home Assistive Devices/Equipment: Gilford Rile (specify type) ADL Screening (condition at time of admission) Patient's cognitive ability adequate to safely complete daily activities?: No Is the patient deaf or have difficulty hearing?: Yes Does the patient have difficulty seeing, even when wearing glasses/contacts?: Yes Does the patient have difficulty concentrating, remembering, or making decisions?: Yes Patient able to express need for assistance with ADLs?: No Does the patient have difficulty dressing or bathing?: Yes Independently performs ADLs?: No Communication: Needs assistance Is this a change from baseline?: Pre-admission baseline Dressing (OT): Needs assistance Is this a change from baseline?: Pre-admission baseline Grooming: Needs assistance Is this a change from baseline?: Pre-admission baseline Feeding: Independent Bathing: Needs assistance Is this a change from baseline?:  Pre-admission baseline Toileting: Needs assistance Is this a change from baseline?: Pre-admission baseline In/Out Bed: Needs assistance Is this a change from baseline?: Pre-admission baseline Walks in Home: Needs assistance, Independent with device (comment) Is this a change from baseline?: Pre-admission baseline Does the patient have difficulty walking or climbing stairs?: Yes Weakness of Legs: Both Weakness of Arms/Hands: Both  Permission Sought/Granted                  Emotional Assessment              Admission diagnosis:  Weakness [R53.1] COVID-19 [U07.1] Patient Active Problem List   Diagnosis Date Noted   COVID-19 08/17/2022   Weakness 08/16/2022   COVID-19 virus infection 08/16/2022   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   Chronic heart failure with preserved ejection fraction (HFpEF) 04/16/2022   Frequent PVCs 04/16/2022   Elevated brain natriuretic peptide (BNP) level 02/04/2022   Bilateral carpal tunnel syndrome 06/22/2021   Allergic rhinitis 03/09/2021   Macular degeneration, 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   Vision changes 02/04/2021   Dementia 02/04/2021   MGUS (monoclonal gammopathy of unknown significance) 07/07/2020   History of nonmelanoma skin cancer 03/11/2020   Basal cell carcinoma 12/26/2019   Skin tear of elbow without complication [id number]   Leg swelling 12/25/2018   GERD (gastroesophageal reflux disease)    Health care  maintenance 01/26/2018   Glossitis 01/26/2018   History of syncope 10/12/2017   Syncope 09/23/2017   Leg pain 01/14/2017   Skin lump of arm 05/29/2015   Rash 12/15/2014   Post-nasal drip 12/15/2014   Hand tingling 12/15/2014   Hyperglycemia 11/14/2013   Medicare annual wellness visit, subsequent 11/09/2013   Paresthesia 07/01/2013   Cough 09/18/2012   Dysphagia 06/13/2012   Schatzki's ring 06/13/2012   Advance care planning 08/06/2011   Carotid stenosis 11/01/2006   HLD (hyperlipidemia) 10/29/2006   Essential hypertension  10/29/2006   BENIGN PROSTATIC HYPERTROPHY 10/29/2006   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   ERECTILE DYSFUNCTION 10/28/2006   VARICOSE VEINS, LOWER EXTREMITIES 10/28/2006   TIA 10/17/2006   PCP:  Tonia Ghent, MD Pharmacy:   [place], [place] - [place] [address] [place] [address] Phone: [phone] Fax: [phone]  [place], [place] - [address] [place] [address] Phone: [phone] Fax: [phone]     Social Determinants of Health (SDOH) Social History: SDOH Screenings   Food Insecurity: No Food Insecurity (08/17/2022)  Housing: Low Risk  (08/17/2022)  Transportation Needs: No Transportation Needs (08/17/2022)  Utilities: Not At Risk (08/17/2022)  Depression (PHQ2-9): Low Risk  (07/08/2022)  Financial Resource Strain: Low Risk  (03/04/2022)  Physical Activity: Inactive (03/04/2022)  Social Connections: Unknown (03/04/2022)  Stress: No Stress Concern Present (03/04/2022)  Tobacco Use: Medium Risk (08/17/2022)   SDOH Interventions:     Readmission Risk Interventions     No data to display

## 2022-08-18 NOTE — Progress Notes (Signed)
Patient being discharged home. PIV removed. Went over discharge instructions and medications with patients wife. She stated that she understood and all questions were answered. Patient going home with with POV.

## 2022-08-18 NOTE — TOC Transition Note (Signed)
Transition of Care [facility]) - CM/SW Discharge Note   Patient Details  Name: [name] MRN: [id number] Date of Birth: [birth year]  Transition of Care [facility]) CM/SW Contact:  Gerilyn Pilgrim, LCSW Phone Number: 08/18/2022, 2:04 PM   Clinical Narrative:   Pt discharging with [facility] PT/OT/AID/RN. Georgina Snell with [facility] notified. CSW signing off.           Patient Goals and CMS Choice      Discharge Placement                         Discharge Plan and Services Additional resources added to the After Visit Summary for                                       Social Determinants of Health (SDOH) Interventions SDOH Screenings   Food Insecurity: No Food Insecurity (08/17/2022)  Housing: Low Risk  (08/17/2022)  Transportation Needs: No Transportation Needs (08/17/2022)  Utilities: Not At Risk (08/17/2022)  Depression (PHQ2-9): Low Risk  (07/08/2022)  Financial Resource Strain: Low Risk  (03/04/2022)  Physical Activity: Inactive (03/04/2022)  Social Connections: Unknown (03/04/2022)  Stress: No Stress Concern Present (03/04/2022)  Tobacco Use: Medium Risk (08/17/2022)     Readmission Risk Interventions     No data to display

## 2022-08-19 ENCOUNTER — Telehealth: Payer: Self-pay | Admitting: Family Medicine

## 2022-08-19 ENCOUNTER — Emergency Department: Payer: Medicare Other

## 2022-08-19 ENCOUNTER — Emergency Department
Admission: EM | Admit: 2022-08-19 | Discharge: 2022-08-19 | Disposition: A | Discharge status: Home / Self Care | Payer: Medicare Other | Attending: Student in an Organized Health Care Education/Training Program | Admitting: Student in an Organized Health Care Education/Training Program

## 2022-08-19 ENCOUNTER — Other Ambulatory Visit: Payer: Self-pay

## 2022-08-19 DIAGNOSIS — M545 Low back pain, unspecified: Secondary | ICD-10-CM | POA: Diagnosis present

## 2022-08-19 DIAGNOSIS — F0390 Unspecified dementia without behavioral disturbance: Secondary | ICD-10-CM | POA: Insufficient documentation

## 2022-08-19 DIAGNOSIS — Z8616 Personal history of COVID-19: Secondary | ICD-10-CM | POA: Insufficient documentation

## 2022-08-19 DIAGNOSIS — R451 Restlessness and agitation: Secondary | ICD-10-CM

## 2022-08-19 DIAGNOSIS — M25552 Pain in left hip: Secondary | ICD-10-CM | POA: Insufficient documentation

## 2022-08-19 DIAGNOSIS — R410 Disorientation, unspecified: Secondary | ICD-10-CM | POA: Insufficient documentation

## 2022-08-19 LAB — CBC WITH DIFFERENTIAL/PLATELET
Abs Immature Granulocytes: 0.01 10*3/uL (ref 0.00–0.07)
Basophils Absolute: 0 10*3/uL (ref 0.0–0.1)
Basophils Relative: 0 %
Eosinophils Absolute: 0.1 10*3/uL (ref 0.0–0.5)
Eosinophils Relative: 2 %
HCT: 46.7 % (ref 39.0–52.0)
Hemoglobin: 15.7 g/dL (ref 13.0–17.0)
Immature Granulocytes: 0 %
Lymphocytes Relative: 30 %
Lymphs Abs: 1.4 10*3/uL (ref 0.7–4.0)
MCH: 31.7 pg (ref 26.0–34.0)
MCHC: 33.6 g/dL (ref 30.0–36.0)
MCV: 94.3 fL (ref 80.0–100.0)
Monocytes Absolute: 0.6 10*3/uL (ref 0.1–1.0)
Monocytes Relative: 12 %
Neutro Abs: 2.6 10*3/uL (ref 1.7–7.7)
Neutrophils Relative %: 56 %
Platelets: 143 10*3/uL — ABNORMAL LOW (ref 150–400)
RBC: 4.95 MIL/uL (ref 4.22–5.81)
RDW: 11.9 % (ref 11.5–15.5)
WBC: 4.6 10*3/uL (ref 4.0–10.5)
nRBC: 0 % (ref 0.0–0.2)

## 2022-08-19 LAB — COMPREHENSIVE METABOLIC PANEL
ALT: 22 U/L (ref 0–44)
AST: 34 U/L (ref 15–41)
Albumin: 4.2 g/dL (ref 3.5–5.0)
Alkaline Phosphatase: 78 U/L (ref 38–126)
Anion gap: 12 (ref 5–15)
BUN: 23 mg/dL (ref 8–23)
CO2: 26 mmol/L (ref 22–32)
Calcium: 9.1 mg/dL (ref 8.9–10.3)
Chloride: 98 mmol/L (ref 98–111)
Creatinine, Ser: 1.08 mg/dL (ref 0.61–1.24)
GFR, Estimated: >60 mL/min (ref >60)
Glucose, Bld: 108 mg/dL — ABNORMAL HIGH (ref 70–99)
Potassium: 4 mmol/L (ref 3.5–5.1)
Sodium: 136 mmol/L (ref 135–145)
Total Bilirubin: 2.5 mg/dL — ABNORMAL HIGH (ref 0.3–1.2)
Total Protein: 7.5 g/dL (ref 6.5–8.1)

## 2022-08-19 MED ORDER — RISPERIDONE 0.5 MG PO TABS: 0.5000 mg | ORAL_TABLET | Freq: Every day | ORAL | 0 refills | Status: DC

## 2022-08-19 NOTE — ED Notes (Signed)
Pt wife at bedside. States pt did not have a fall but was very confused and agitated last night. He slept and then had another occurrence this morning of confusion and agitation

## 2022-08-19 NOTE — ED Triage Notes (Signed)
Pt from home. Had fall last night and was complaining of lower back pain and left hip pain. Pt denies pain right now. Pt tested p;ositive for COIVD last Monday when he came in for weakness and runny nose. Hx of dementia and per family mentation has recently declined. Uses wheelchair and walker at home for ambulation per pt.  EMS vitals: BP 160/84 HR 61 with occasional PVC Temp 94.2 PO CBG 137

## 2022-08-19 NOTE — Telephone Encounter (Signed)
I spoke with pts wife and she said pt was already on his way to [facility] ED via EMS. Sending note to Dr Damita Dunnings and Damita Dunnings pool; please see pt message also.

## 2022-08-19 NOTE — Telephone Encounter (Signed)
Agree. Thanks

## 2022-08-19 NOTE — Telephone Encounter (Signed)
FYI: This call has been transferred to Access Nurse. Once the result note has been entered staff can address the message at that time.  Patient called in with the following symptoms:  Red Word: very disoriented, lower back pain, covid pos.   Please advise at [phone]  Message is routed to Provider Pool and [facility] Triage

## 2022-08-19 NOTE — Telephone Encounter (Signed)
[name] - Client TELEPHONE ADVICE RECORD AccessNurse Patient Name: [name] ALL RED Gender: Male DOB: 1923-06-22 Age: 89 Y 4 M 24 D Return Phone Number: [phone] (Primary) Address: City/ State/ Zip: [place]  [address] Client [name] - Client Client Site [place] [facility] Provider Elsie Stain "Brigitte Pulse MD Contact Type Call Who Is Calling Patient / Member / Family / Caregiver Call Type Triage / Clinical Caller Name [name] Relationship To Patient Spouse Return Phone Number [phone] (Primary) Chief Complaint CONFUSION - new onset Reason for Call Symptomatic / Request for Health Information Initial Comment Caller was transferred from the office. Caller is calling for her spouse that was at the Hospital, tested positive for COVID, he is experiencing lower back pain, and some confusion. Translation No Nurse Assessment Nurse: Cherre Robins, RN, Ria Comment Date/Time (Eastern Time): 08/19/2022 9:31:08 AM Confirm and document reason for call. If symptomatic, describe symptoms. ---Caller states her husband is Covid positive. Was taken to ED via EMS Monday night and dx'd with covid. D/c'd yesterday. States that he is c/o back pain and having confusion. Does the patient have any new or worsening symptoms? ---Yes Will a triage be completed? ---Yes Related visit to physician within the last 2 weeks? ---Yes Does the PT have any chronic conditions? (i.e. diabetes, asthma, this includes High risk factors for pregnancy, etc.) ---Yes List chronic conditions. ---HTN, heart disease Is this a behavioral health or substance abuse call? ---No Guidelines Guideline Title Affirmed Question Affirmed Notes Nurse Date/Time ([place] Time) COVID-19 - Diagnosed or Suspected Difficult to awaken or acting confused (e.g., disoriented, slurred speech) Weiss-Hilton, RN, Ria Comment 08/19/2022 9:32:52 AM PLEASE  NOTE: All timestamps contained within this report are represented as [place] Standard Time. CONFIDENTIALTY NOTICE: This fax transmission is intended only for the addressee. It contains information that is legally privileged, confidential or otherwise protected from use or disclosure. If you are not the intended recipient, you are strictly prohibited from reviewing, disclosing, copying using or disseminating any of this information or taking any action in reliance on or regarding this information. If you have received this fax in error, please notify us immediately by telephone so that we can arrange for its return to [place]. Phone: [phone], Toll-Free: [phone], Fax: [phone] Page: 2 of 2 Call Id: [id number] [place]. Time Eilene Ghazi Time) Disposition Final User 08/19/2022 9:28:12 AM Send to Urgent Clarnce Flock 08/19/2022 9:34:15 AM Call EMS 911 Now Yes Weiss-Hilton, RN, Ria Comment 08/19/2022 9:35:29 AM 911 Outcome Documentation Weiss-Hilton, RN, Ria Comment Reason: Caller stated she has already called 911 and they have just arrived. Final Disposition 08/19/2022 9:34:15 AM Call EMS 911 Now Yes Weiss-Hilton, RN, Ivonne [place] Disagree/Comply Comply Caller Understands Yes PreDisposition InappropriateToAsk Care Advice Given Per Guideline CALL EMS 911 NOW: * Immediate medical attention is needed. You need to hang up and call 911 (or an ambulance). * Triager Discretion: I'll call you back in a few minutes to be sure you were able to reach them. CARE ADVICE given per COVID-19 - DIAGNOSED OR SUSPECTED (Adult) guideline. Comments User: Carolan Clines, RN Date/Time Eilene Ghazi Time): 08/19/2022 9:33:12 AM cancer pt User: Carolan Clines, RN Date/Time Eilene Ghazi Time): 08/19/2022 9:35:02 AM had complete loss of use of his arms and legs earlier this mornin

## 2022-08-19 NOTE — ED Provider Notes (Signed)
[facility] Provider Note    Event Date/Time   First MD Initiated Contact with Patient 08/19/22 1014     (approximate)   History   Fall (Last night)   HPI  SRICHARAN TREADAWAY is a 89 y.o. male with a history of MGUS as well as dementia presents to the ER for evaluation of low back pain and left hip pain that occurred after the patient had a fall last night.  Unknown how long he was down.  Patient recently tested positive for COVID last week.  Denies any chest pain denies any shortness of breath.  Patient currently asymptomatic denies any discomfort.     Physical Exam   Triage Vital Signs: ED Triage Vitals  Enc Vitals Group     BP 08/19/22 1023 (!) 158/85     Pulse Rate 08/19/22 1023 (!) 59     Resp 08/19/22 1023 15     Temp 08/19/22 1023 97.8 F (36.6 C)     Temp Source 08/19/22 1023 Oral     SpO2 08/19/22 1023 100 %     Weight --      Height --      Head Circumference --      Peak Flow --      Pain Score 08/19/22 1025 0     Pain Loc --      Pain Edu? --      Excl. in [place]? --     Most recent vital signs: Vitals:   08/19/22 1023  BP: (!) 158/85  Pulse: (!) 59  Resp: 15  Temp: 97.8 F (36.6 C)  SpO2: 100%     Constitutional: Alert  Eyes: Conjunctivae are normal.  Head: Atraumatic. Nose: No congestion/rhinnorhea. Mouth/Throat: Mucous membranes are moist.   Neck: Painless ROM.  Cardiovascular:   Good peripheral circulation. Respiratory: Normal respiratory effort.  No retractions.  Gastrointestinal: Soft and nontender.  Musculoskeletal:  no deformity Neurologic:  MAE spontaneously. No gross focal neurologic deficits are appreciated.  Skin:  Skin is warm, dry and intact. No rash noted. Psychiatric: Mood and affect are normal. Speech and behavior are normal.    ED Results / Procedures / Treatments   Labs (all labs ordered are listed, but only abnormal results are displayed) Labs Reviewed  CBC WITH DIFFERENTIAL/PLATELET -  Abnormal; Notable for the following components:      Result Value   Platelets 143 (*)    All other components within normal limits  COMPREHENSIVE METABOLIC PANEL - Abnormal; Notable for the following components:   Glucose, Bld 108 (*)    Total Bilirubin 2.5 (*)    All other components within normal limits  URINALYSIS, ROUTINE W REFLEX MICROSCOPIC     EKG  ED ECG REPORT I, Merlyn Lot, the attending physician, personally viewed and interpreted this ECG.   Date: 08/19/2022  EKG Time: 11:39  Rate: 60  Rhythm: sinus  Axis: normal  Intervals: normal  ST&T Change: no stemi, no depression, occasional pvc    RADIOLOGY Please see ED Course for my review and interpretation.  I personally reviewed all radiographic images ordered to evaluate for the above acute complaints and reviewed radiology reports and findings.  These findings were personally discussed with the patient.  Please see medical record for radiology report.    PROCEDURES:  Critical Care performed:   Procedures   MEDICATIONS ORDERED IN ED: Medications - No data to display   IMPRESSION / MDM / [place] / ED  COURSE  I reviewed the triage vital signs and the nursing notes.                              Differential diagnosis includes, but is not limited to, Dehydration, sepsis, pna, uti, hypoglycemia, cva, drug effect, withdrawal, encephalitis  Patient presenting to the ER for evaluation of symptoms as described above.  Based on symptoms, risk factors and considered above differential, this presenting complaint could reflect a potentially life-threatening illness therefore the patient will be placed on continuous pulse oximetry and telemetry for monitoring.  Laboratory evaluation will be sent to evaluate for the above complaints.    Clinical Course as of 08/19/22 1216  Thu Aug 19, 2022  1214 Further questioning with family now at bedside and primary concern for being brought to the ER today was  increasing confusion and agitation particularly in the evening after he was discharged yesterday.  No reported falls since yesterday.  His blood work is reassuring.  He is not septic.  Well-perfused.  CT imaging on my review and interpretation shows no acute abnormality.  No sign of fracture.  On further discussion with the wife it seems like he is having some mild sob downing likely secondary to recent admission and then discharged back to where he reportedly had very poor sleep while in the hospital.  As he is reporting some episodes of agitation particular in the evening we discussed options and my recommendation for trying some low-dose Risperdal in the evenings to see if this helps manage his symptoms.  Alternatively we discussed option for hospitalization but family would prefer to manage at home if possible and I think that is reasonable given his dementia.  Do not feel that further diagnostic testing clinically indicated.  Does appear stable and appropriate for outpatient follow-up. [PR]    Clinical Course User Index [PR] Merlyn Lot, MD     FINAL CLINICAL IMPRESSION(S) / ED DIAGNOSES   Final diagnoses:  Confusion  Agitation     Rx / DC Orders   ED Discharge Orders          Ordered    risperiDONE (RISPERDAL) 0.5 MG tablet  Daily at bedtime        08/19/22 1213             Note:  This document was prepared using Dragon voice recognition software and may include unintentional dictation errors.    Merlyn Lot, MD 08/19/22 [phone]

## 2022-08-20 ENCOUNTER — Telehealth: Payer: Self-pay | Admitting: Family Medicine

## 2022-08-20 NOTE — Telephone Encounter (Signed)
See phone note

## 2022-08-20 NOTE — Telephone Encounter (Signed)
Called and spoke to patient wife. He was in hospital for 3 days, diagnosed with Covid. Discharged 08/18/22, they took him home and then took him back to the hospital the next day, because he was extremely confused and "talking out of his head". Hospital advised him he was experiencing sundowning.  Patients wife advised yesterday and today patient has been much better and a lot less confused. She appreciates you checking in. Scheduled hosp f/u on 08/27/22.

## 2022-08-20 NOTE — Telephone Encounter (Signed)
I saw the MyChart message with subsequent ER evaluation last night.  Please get update on patient today.  Thanks.

## 2022-08-20 NOTE — Telephone Encounter (Signed)
Noted.  Thanks.  Glad to hear.  ? ?

## 2022-08-23 ENCOUNTER — Telehealth: Payer: Self-pay

## 2022-08-23 NOTE — Transitions of Care (Post Inpatient/ED Visit) (Unsigned)
   08/23/2022  Name: [name] MRN: [id number] DOB: 03-05-1924  Today's TOC FU Call Status: Today's TOC FU Call Status:: Successful TOC FU Call Competed TOC FU Call Complete Date: 08/23/22  Transition Care Management Follow-up Telephone Call Date of Discharge: 08/19/22 Discharge Facility: [facility]) Type of Discharge: Emergency Department Reason for ED Visit: Other: How have you been since you were released from the hospital?: Better Any questions or concerns?: No  Items Reviewed: Did you receive and understand the discharge instructions provided?: Yes Medications obtained and verified?: Yes (Medications Reviewed) Any new allergies since your discharge?: No Dietary orders reviewed?: NA Do you have support at home?: Yes People in Home: significant other  Home Care and Equipment/Supplies: Were Home Health Services Ordered?: Yes Name of Home Health Agency:: [facility] services Has Agency set up a time to come to your home?: Yes First Home Health Visit Date: 08/23/22 Any new equipment or medical supplies ordered?: NA  Functional Questionnaire: Do you need assistance with bathing/showering or dressing?: Yes Do you need assistance with meal preparation?: Yes Do you need assistance with eating?: No Do you have difficulty maintaining continence: No Do you need assistance with getting out of bed/getting out of a chair/moving?: Yes Do you have difficulty managing or taking your medications?: Yes  Follow up appointments reviewed: PCP Follow-up appointment confirmed?: Yes Date of PCP follow-up appointment?: 08/27/22 Follow-up Provider: Dr. Para [facility] Follow-up appointment confirmed?: NA Do you need transportation to your follow-up appointment?: No Do you understand care options if your condition(s) worsen?: Yes-patient verbalized understanding    Agnes Lawrence, CMA (AAMA)  [facility]- [facility] [phone]

## 2022-08-24 ENCOUNTER — Ambulatory Visit: Payer: Medicare Other | Attending: Cardiology | Admitting: Cardiology

## 2022-08-24 VITALS — BP 160/72 | HR 63 | Ht 68.0 in | Wt 134.6 lb

## 2022-08-24 DIAGNOSIS — I43 Cardiomyopathy in diseases classified elsewhere: Secondary | ICD-10-CM

## 2022-08-24 DIAGNOSIS — E854 Organ-limited amyloidosis: Secondary | ICD-10-CM

## 2022-08-24 DIAGNOSIS — I493 Ventricular premature depolarization: Secondary | ICD-10-CM

## 2022-08-24 DIAGNOSIS — D472 Monoclonal gammopathy: Secondary | ICD-10-CM

## 2022-08-24 DIAGNOSIS — I10 Essential (primary) hypertension: Secondary | ICD-10-CM

## 2022-08-24 DIAGNOSIS — I5032 Chronic diastolic (congestive) heart failure: Secondary | ICD-10-CM

## 2022-08-24 DIAGNOSIS — R55 Syncope and collapse: Secondary | ICD-10-CM | POA: Diagnosis not present

## 2022-08-24 NOTE — Patient Instructions (Signed)
Medication Instructions:  Your physician recommends that you continue on your current medications as directed. Please refer to the Current Medication list given to you today.  *If you need a refill on your cardiac medications before your next appointment, please call your pharmacy*  Lab Work: -None ordered If you have labs (blood work) drawn today and your tests are completely normal, you will receive your results only by: MyChart Message (if you have MyChart) OR A paper copy in the mail If you have any lab test that is abnormal or we need to change your treatment, we will call you to review the results.  Testing/Procedures: -None ordered  Follow-Up: At [facility], you and your health needs are our priority.  As part of our continuing mission to provide you with exceptional heart care, we have created designated Provider Care Teams.  These Care Teams include your primary Cardiologist (physician) and Advanced Practice Providers (APPs -  Physician Assistants and Nurse Practitioners) who all work together to provide you with the care you need, when you need it.  We recommend signing up for the patient portal called "MyChart".  Sign up information is provided on this After Visit Summary.  MyChart is used to connect with patients for Virtual Visits (Telemedicine).  Patients are able to view lab/test results, encounter notes, upcoming appointments, etc.  Non-urgent messages can be sent to your provider as well.   To learn more about what you can do with MyChart, go to [email or web address].    Your next appointment:   09/30/2022 9:00 AM   Provider:   Yvonne Kendall, MD  Other Instructions -None

## 2022-08-24 NOTE — Telephone Encounter (Signed)
Noted.  See at OV.  Thanks.

## 2022-08-24 NOTE — Progress Notes (Signed)
Cardiology Office Note:   Date:  08/24/2022  ID:  [name], DOB 11-02-1923, MRN [id number]  History of Present Illness:   [name] is a 89 y.o. 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  He was last seen in clinic by Dr. Okey Dupre 06/23/2022.  And did not had subsequent cardiac MRI that was consistent with infiltrative cardiomyopathy.  LVEF was normal. He had just recently follow up with Dr Cathie Hoops, and they had agreed to defer chemotherapy for MGUS/amyloidosis.  At his appointment they have deferred any medication changes but it had opted to place him on ambulatory cardiac monitor.  He presented to the [facility] emergency department on 08/16/2022 with complaints of generalized weakness of sudden onset.  Initial vital signs were stable and labs were unremarkable.  UA was negative for leukocytes and nitrates.  He tested positive for COVID-19.  He was initially treated with supportive care and did not require oxygen therapy.  He was subsequently discharged on 08/18/2022.  He presented back to the emergency department on 08/19/2022 status post mechanical fall.  He was complaining of lower back pain and left hip pain.  It was unknown of how long he was down at home.  Blood pressure was noted to be slightly elevated at 158/85.  CT mention showed no acute abnormality and no sign of fracture.  When patient's wife arrived at the emergency department she said the patient did not follow-up with him very confused and agitated the night before.  He was subsequently discharged home.  He returns to clinic today accompanied by family member stating that he has had several falls and has had confusion as of late.  Since his hospitalization for COVID-19 that has worsened.  He continues to complain of some hip pain and shoulder pain from the fall.   Denies any other symptoms of chest pain, shortness of breath, peripheral edema, lightheadedness, or dizziness.  The family states that he has blacked out but the patient does not recall instances where he has had a blackout spells. Family clarified one syncopal episode with three falls since Feb. He was placed on a ZIO AT monitor. He endorses continued cough since his COVID infection.   ROS: 10 point review of systems is negative with the exception of what has been listed in the HPI  Studies Reviewed:    EKG:  No new tracings were completed today  Zio AT monitor report pending  Cardiac MRI 05/24/22 IMPRESSION: 1. Findings consistent with cardiac amyloidosis, including severe asymmetric LV hypertrophy, markedly elevated extracellular volume (45%), and diffuse late gadolinium enhancement   2.  Normal LV size and systolic function (EF 56%)   3.  Normal RV size with mild systolic dysfunction (EF 43%)   4.  Dilated ascending aorta measuring 74mm  Risk Assessment/Calculations:          Physical Exam:   VS:  BP (!) 160/72   Pulse 63   Ht 5\' 8"  (1.727 m)   Wt 134 lb 9.6 oz (61.1 kg)   SpO2 97%   BMI 20.47 kg/m    Wt Readings from Last 3 Encounters:  08/24/22 134 lb 9.6 oz (61.1 kg)  08/16/22 134 lb (60.8 kg)  07/19/22 139 lb (63 kg)     GEN: Well nourished, well developed in no acute distress NECK: No JVD; No carotid bruits CARDIAC: RRR,  no murmurs, rubs, gallops RESPIRATORY:  Clear to auscultation without rales, wheezing or rhonchi, nonproductive cough ABDOMEN: Soft, non-tender, non-distended EXTREMITIES:  No edema; No deformity   ASSESSMENT AND PLAN:   Syncope with collapse in Feb. Patient was then placed on a Zio AT monitor and his zoloft was stopped as that was the only ned medication he was placed on. He was advised to remain well hydrated. Unfortunately he came down with COVID 19 and was hospitalized. He has been encouraged to continue with hydration. The preliminary  report is available from his monitor today.  Cardiac amyloidosis with chronic HFpEF.  Patient has undergone cardiac MRI findings consistent with amyloidosis.  LVEF is preserved.  He appears euvolemic on exam. Has previously declined chemotherapy for MGUS.  Hypertension with blood pressure today 160/72. Earlier in the day daughter stated his blood pressure was 99 systolic. Typically blood pressure runs lower. He is currently on no anti-hypertensive medications. Continue to monitor blood pressure at home and keep blood pressure log. If continued elevated trends can consider adding medications at that time.   PVC's that are fairly frequent, large amount of bigeminy noted on preliminary ZIO AT monitor strips.  Will continue to await for the finalized report.  After reviewing strips with Dr. Okey Dupre primary cardiologist this afternoon patient will be referred to EP for further evaluation and possible loop recorder. Was started on sertraline that has now been stopped but had a syncopal episode and about three falls while on the medication.  Disposition patient to return to clinic to see Dr End 09/30/22 to re-evaluate symptoms, patient also referred to EP for syncope and possible loop recorder        Signed, Rober Skeels, NP

## 2022-08-25 ENCOUNTER — Other Ambulatory Visit: Payer: Self-pay

## 2022-08-25 DIAGNOSIS — R55 Syncope and collapse: Secondary | ICD-10-CM

## 2022-08-25 NOTE — Progress Notes (Signed)
Charlsie Quest, NP to  Lenor Derrick, RN Please send in referral to EP for possible loop recorder for syncope. I had discussed the  strips from his Zio AT monitor with Dr End and that was his recommendation at this time, no changes in medication regimen. Can you please let his daughter know as well? Thanks,  Lavonna Rua  Order for referral placed

## 2022-08-26 ENCOUNTER — Ambulatory Visit: Payer: Medicare Other | Admitting: Internal Medicine

## 2022-08-27 ENCOUNTER — Encounter: Payer: Self-pay | Admitting: Family Medicine

## 2022-08-27 ENCOUNTER — Ambulatory Visit: Payer: Medicare Other | Admitting: Family Medicine

## 2022-08-27 VITALS — BP 102/80 | HR 52 | Temp 98.0°F | Ht 68.0 in | Wt 134.0 lb

## 2022-08-27 DIAGNOSIS — R55 Syncope and collapse: Secondary | ICD-10-CM | POA: Diagnosis not present

## 2022-08-27 DIAGNOSIS — F0390 Unspecified dementia without behavioral disturbance: Secondary | ICD-10-CM

## 2022-08-27 DIAGNOSIS — U071 COVID-19: Secondary | ICD-10-CM

## 2022-08-27 MED ORDER — RISPERIDONE 0.5 MG PO TABS: 0.7500 mg | ORAL_TABLET | Freq: Every day | ORAL | 1 refills | Status: DC

## 2022-08-27 NOTE — Progress Notes (Unsigned)
Syncope, cards eval done.   Mood d/w pt.  Off zoloft and on risperdal.    ER eval d/w pt.  Prev with covid.   Discussed not driving.    He noted short term memory changes.  D/w pt about rationale for Risperdal use.    Still with some cough and sputum.  No fevers.  Cough is slowly getting better.  Taking robitussin.   Still not sleeping well.    Meds, vitals, and allergies reviewed.   ROS: Per HPI unless specifically indicated in ROS section

## 2022-08-27 NOTE — Patient Instructions (Signed)
Don't drive for now.  Try increasing the risperdal to 1.5 tabs at night if needed. After 2-3 nights, can increase to 2 tabs at night.   Please let me know how that goes.  Take care.  Glad to see you.

## 2022-08-29 NOTE — Assessment & Plan Note (Signed)
He is out of the quarantine window and his lungs are clear and he does appear to be improving in the meantime.

## 2022-08-29 NOTE — Assessment & Plan Note (Signed)
I will ask for cardiology input to see if his follow-up appointment can be moved sooner.

## 2022-08-29 NOTE — Assessment & Plan Note (Addendum)
Off sertraline in the meantime.  See above.  Would try increasing risperdal to 1.5 tabs at night if needed. After 2-3 nights, can increase to 2 tabs at night.  I asked his wife to please let me know how that goes.  He can stop the a.m. dose of Risperdal.

## 2022-08-30 ENCOUNTER — Telehealth: Payer: Self-pay | Admitting: Family Medicine

## 2022-08-30 NOTE — Telephone Encounter (Signed)
Noted.  Would continue as is.  Please update [place] as needed/when needing a refill.  Thanks.

## 2022-08-30 NOTE — Telephone Encounter (Signed)
Patient was advised to call in to let Dr Para March to let him know how he was doing on the dosage increase of  risperiDONE (RISPERDAL) 0.5 MG tablet.Wife called in today and  said that he is doing a lot better on this dosage.and she thinks that this dosage will work out for him.

## 2022-08-31 NOTE — Telephone Encounter (Signed)
Patients wife notified and thanks [place] for the call back.

## 2022-09-01 DIAGNOSIS — E785 Hyperlipidemia, unspecified: Secondary | ICD-10-CM

## 2022-09-01 DIAGNOSIS — K219 Gastro-esophageal reflux disease without esophagitis: Secondary | ICD-10-CM

## 2022-09-01 DIAGNOSIS — E559 Vitamin D deficiency, unspecified: Secondary | ICD-10-CM

## 2022-09-01 DIAGNOSIS — D696 Thrombocytopenia, unspecified: Secondary | ICD-10-CM

## 2022-09-01 DIAGNOSIS — Z9181 History of falling: Secondary | ICD-10-CM

## 2022-09-01 DIAGNOSIS — U071 COVID-19: Secondary | ICD-10-CM | POA: Diagnosis not present

## 2022-09-01 DIAGNOSIS — I5032 Chronic diastolic (congestive) heart failure: Secondary | ICD-10-CM

## 2022-09-01 DIAGNOSIS — F05 Delirium due to known physiological condition: Secondary | ICD-10-CM | POA: Diagnosis not present

## 2022-09-01 DIAGNOSIS — Z7982 Long term (current) use of aspirin: Secondary | ICD-10-CM

## 2022-09-01 DIAGNOSIS — F03918 Unspecified dementia, unspecified severity, with other behavioral disturbance: Secondary | ICD-10-CM | POA: Diagnosis not present

## 2022-09-01 DIAGNOSIS — D472 Monoclonal gammopathy: Secondary | ICD-10-CM

## 2022-09-01 DIAGNOSIS — I110 Hypertensive heart disease with heart failure: Secondary | ICD-10-CM | POA: Diagnosis not present

## 2022-09-02 ENCOUNTER — Encounter: Payer: Self-pay | Admitting: Internal Medicine

## 2022-09-02 ENCOUNTER — Ambulatory Visit: Payer: Medicare Other | Attending: Internal Medicine | Admitting: Internal Medicine

## 2022-09-02 VITALS — BP 124/58 | HR 63 | Ht 68.0 in | Wt 133.1 lb

## 2022-09-02 DIAGNOSIS — E854 Organ-limited amyloidosis: Secondary | ICD-10-CM

## 2022-09-02 DIAGNOSIS — R296 Repeated falls: Secondary | ICD-10-CM | POA: Insufficient documentation

## 2022-09-02 DIAGNOSIS — R55 Syncope and collapse: Secondary | ICD-10-CM

## 2022-09-02 DIAGNOSIS — I43 Cardiomyopathy in diseases classified elsewhere: Secondary | ICD-10-CM

## 2022-09-02 DIAGNOSIS — D472 Monoclonal gammopathy: Secondary | ICD-10-CM

## 2022-09-02 NOTE — Progress Notes (Signed)
Follow-up Outpatient Visit Date: 09/02/2022  Primary Care Provider: Joaquim Nam, MD [address] [place] [address]  Chief Complaint: Recurrent falls  HPI:  [name] is a 89 y.o. 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  He was just seen last week in our office by Charlsie Quest, NP.  He was hospitalized on 08/16/2022 with generalized weakness and was found to have COVID-19.  He returned to the ED the day after discharge after mechanical fall.  His family was concerned about increasing confusion and falls that seem to have been worsened by COVID-19.  They were also worried that he may have been intermittently blacking out.  He had been wearing an event monitor in March, which showed frequent PVCs, occasional PACs, and several brief episodes of NSVT and SVT.  There was also at least 1 episode suspicious for PAF, though this was incompletely evaluated.  He has been referred to EP for further evaluation and possible ILR placement.  Today, [name] reports that he feels fairly well.  He denies chest pain, shortness of breath, palpitations, lightheadedness, and edema.  He reports that his only fall and syncopal episode occurred in the bathroom, which led to hospitalization and diagnosis with COVID-19 earlier this month.  However, his wife notes that [name] has fallen several more times since then.  She does not believe that he has passed out per se.  He continues to have a little bit of soreness of the left hip after a fall, though radiographs did not reveal any fractures.  --------------------------------------------------------------------------------------------------  Past Medical History:  Diagnosis Date   Allergy    BPH (benign prostatic hyperplasia) 06/1997   Bradycardia    Cardiac amyloidosis    GERD (gastroesophageal reflux  disease)    schatzki ring dilated, [facility] (Dr. Judith Blonder)   Hyperlipemia 1990's   Hypertension 02/2004   MGUS (monoclonal gammopathy of unknown significance)    TIA (transient ischemic attack)    was not on aspirin at time of TIA per patient   Past Surgical History:  Procedure Laterality Date   APPENDECTOMY  1947   bilateral hernia repair  1965   CARPAL TUNNEL RELEASE Bilateral    CHOLECYSTECTOMY  07/2003   DG CHEST FOR [employer] EMPLOYEE  06/02-08/2006   TIA, Dyslipidemia, HTN, Right Sided Carotid Artery Stenosis 40-60%   ESOPHAGOGASTRODUODENOSCOPY  03/23/2001   schatzki ring dilated, [facility] (Dr. Judith Blonder)   ESOPHAGOGASTRODUODENOSCOPY  06/13/2012   Procedure: ESOPHAGOGASTRODUODENOSCOPY (EGD);  Surgeon: Beverley Fiedler, MD;  Location: [facility] ENDOSCOPY;  Service: Gastroenterology;  Laterality: N/A;   holter monitor  10/17/2001   ISOL, PVC's, SVC's   MRI Brain     nml, MRA brain, intracran athersc dz MCA RPCA 10/18/06, Head CT NML 10/17/06   MRI Cerv Spine  10/18/2006   mild degen changes    Current Meds  Medication Sig   albuterol (VENTOLIN HFA) 108 (90 Base) MCG/ACT inhaler Inhale 1-2 puffs into the lungs every 6 (six) hours as needed for wheezing or shortness of breath.   aspirin 81 MG tablet Take 81 mg by mouth daily.   Cyanocobalamin (VITAMIN B12) 1000 MCG TBCR Take 1 tablet by mouth daily.   feeding supplement (ENSURE ENLIVE / ENSURE PLUS) LIQD Take 237 mLs by mouth 2 (two) times daily between meals.   ipratropium (ATROVENT) 0.06 % nasal spray Place 2 sprays into  both nostrils 3 (three) times daily as needed for rhinitis.   loratadine (CLARITIN) 10 MG tablet TAKE 1 TABLET BY MOUTH ONCE DAILY   Multiple Vitamin (MULTIVITAMIN WITH MINERALS) TABS tablet Take 1 tablet by mouth daily.   pantoprazole (PROTONIX) 40 MG tablet TAKE 1 TABLET BY MOUTH ONCE DAILY   risperiDONE (RISPERDAL) 0.5 MG tablet Take 1.5-2 tablets (0.75-1 mg total) by mouth at bedtime.    Allergies: Atorvastatin,  Ezetimibe-simvastatin, Sildenafil, and Simvastatin  Social History   Tobacco Use   Smoking status: Former    Packs/day: 0.50    Years: 55.00    Additional pack years: 0.00    Total pack years: 27.50    Types: Cigarettes    Quit date: 05/17/1970    Years since quitting: 52.3   Smokeless tobacco: Never   Tobacco comments:    quit 1972  Vaping Use   Vaping Use: Never used  Substance Use Topics   Alcohol use: No   Drug use: No    Family History  Problem Relation Age of Onset   Stroke Mother    Diabetes Sister    Heart disease Sister        CAD, AFib, GI bleed, FE deficiency   Dementia Sister    Prostate cancer Brother    Cancer Brother 25       lungs, smoker   Colon cancer Neg Hx     Review of Systems: A 12-system review of systems was performed and was negative except as noted in the HPI.  --------------------------------------------------------------------------------------------------  Physical Exam: BP (!) 124/58 (BP Location: Left Arm, Patient Position: Sitting, Cuff Size: Normal)   Pulse 63   Ht  (1.727 m)   Wt 133 lb 2 oz (60.4 kg)   SpO2 97%   BMI 20.24 kg/m   General:  NAD.  Accompanied by his wife. Neck: No JVD or HJR. Lungs: Clear to auscultation bilaterally without wheezes or crackles. Heart: Regular rate and rhythm with occasional extrasystoles and1/6 systolic murmur.  No rubs or gallops. Abdomen: Soft, nontender, nondistended. Extremities: No lower extremity edema.  EKG: Normal sinus rhythm with first-degree AV block and occasional PVCs, left axis deviation, incomplete right bundle branch block, and poor R wave progression.  Lab Results  Component Value Date   WBC 4.6 08/19/2022   HGB 15.7 08/19/2022   HCT 46.7 08/19/2022   MCV 94.3 08/19/2022   PLT 143 (L) 08/19/2022    Lab Results  Component Value Date   NA 136 08/19/2022   K 4.0 08/19/2022   CL 98 08/19/2022   CO2 26 08/19/2022   BUN 23 08/19/2022   CREATININE 1.08 08/19/2022    GLUCOSE 108 (H) 08/19/2022   ALT 22 08/19/2022    Lab Results  Component Value Date   CHOL 176 02/04/2021   HDL 36 (L) 02/04/2021   LDLCALC 123 (H) 02/04/2021   LDLDIRECT 152.2 06/12/2012   TRIG 87 02/04/2021   CHOLHDL 4.9 02/04/2021    --------------------------------------------------------------------------------------------------  ASSESSMENT AND PLAN: Recurrent falls and questionable syncope: Mr. Jeansonne denies further falls and syncope though his wife notes that he has fallen several times since his recent hospitalization.  Episode that led to hospitalization in early April was confounded by COVID-19 diagnosis at that time and generalized weakness.  Certainly Mr. Poteet is at risk for arrhythmias leading to syncope given his cardiac amyloidosis and frequent ventricular ectopy.  I have personally reviewed his event monitor again today which shows frequent PVCs, occasional PACs, and  questionable atrial fibrillation.  No significant pauses or persistent tachycardia arrhythmia was seen, however, to explain syncope.  There is no indication for pacing at this time.  We discussed moving forward with EP consultation for loop recorder placement but have agreed to defer this for reasons outlined below.  Cardiac amyloidosis and MGUS: Mr. Deiss has known MGUS and cardiac MRI with findings consistent with amyloidosis.  He has declined chemotherapy.  I suspect that his cardiac amyloidosis will progress.  He does not have clinical evidence of heart failure at this time, though his functional capacity is fairly limited.  We discussed the natural history of untreated cardiac amyloidosis; I suspect his life expectancy will be measured in months to a year or 2 (mean life expectancy from the time of diagnosis of cardiac amyloidosis is 13 months, survival from onset of heart failure symptoms is 6 months).  In light of these findings and Mr. Steinfeldt advanced age with prior decision to forego chemotherapy for  treatment of his MGUS, we have agreed to defer any further monitoring or invasive procedures as they are unlikely to alter his prognosis.  I think he would benefit with a palliative care consultation and potential early involvement in hospice.  I have encouraged him to discuss this with Drs. Para March and Cathie Hoops.  Follow-up: Return to clinic in 3 months.  Yvonne Kendall, MD 09/02/2022 11:57 AM

## 2022-09-02 NOTE — Patient Instructions (Signed)
Medication Instructions:  Your Physician recommend you continue on your current medication as directed.    *If you need a refill on your cardiac medications before your next appointment, please call your pharmacy*   Lab Work: None ordered   Testing/Procedures: None ordered    Follow-Up: At [facility], you and your health needs are our priority.  As part of our continuing mission to provide you with exceptional heart care, we have created designated Provider Care Teams.  These Care Teams include your primary Cardiologist (physician) and Advanced Practice Providers (APPs -  Physician Assistants and Nurse Practitioners) who all work together to provide you with the care you need, when you need it.  We recommend signing up for the patient portal called "MyChart".  Sign up information is provided on this After Visit Summary.  MyChart is used to connect with patients for Virtual Visits (Telemedicine).  Patients are able to view lab/test results, encounter notes, upcoming appointments, etc.  Non-urgent messages can be sent to your provider as well.   To learn more about what you can do with MyChart, go to [email or web address].    Your next appointment:   3 month(s)  Provider:   You may see Christopher End, MD or one of the following Advanced Practice Providers on your designated Care Team:   Christopher Berge, NP Ryan Dunn, PA-C Cadence Furth, PA-C Sheri Hammock, NP     

## 2022-09-03 ENCOUNTER — Telehealth: Payer: Self-pay | Admitting: Family Medicine

## 2022-09-03 ENCOUNTER — Inpatient Hospital Stay: Payer: Medicare Other | Attending: Hospice and Palliative Medicine | Admitting: Hospice and Palliative Medicine

## 2022-09-03 DIAGNOSIS — I43 Cardiomyopathy in diseases classified elsewhere: Secondary | ICD-10-CM

## 2022-09-03 DIAGNOSIS — E854 Organ-limited amyloidosis: Secondary | ICD-10-CM | POA: Diagnosis not present

## 2022-09-03 NOTE — Telephone Encounter (Signed)
Christy from [facility] called asking if Para March would serve as the hospice attending provider, per the pt's wife request? Call back # [phone]

## 2022-09-03 NOTE — Progress Notes (Signed)
Virtual Visit via Telephone Note  I connected with [name] on 09/03/22 at 11:30 AM EDT by telephone and verified that I am speaking with the correct person using two identifiers.  Location: Patient: Home Provider: Clinic   I discussed the limitations, risks, security and privacy concerns of performing an evaluation and management service by telephone and the availability of in person appointments. I also discussed with the patient that there may be a patient responsible charge related to this service. The patient expressed understanding and agreed to proceed.   History of Present Illness: [name] is a 89 year old male with multiple medical problems including cardiac amyloidosis, diastolic dysfunction, MGUS, history of TIA.  Patient has declined chemotherapy.  Amyloidosis is expected to progress with overall poor prognosis.  He was referred to palliative care to address goals.   Observations/Objective: I called and spoke with patient and wife.  Both confirm having met with cardiology and understanding that his prognosis is poor.  With patient and wife verbalized that they are not interested in pursuing any further workup or treatments at this point and instead wished to keep him home and comfortable.  We discussed the option of involving hospice and both verbalized agreement.  Will send referral to hospice.  Discussed CODE STATUS.  Patient would not want to be resuscitated or have his life prolonged artificially on machines.  Patient and wife are in agreement with DNR/DNI.  Assessment and Plan: Cardiac amyloidosis -patient's goal is to stay home and comfortable until end-of-life.  Referral to hospice.  DNR/DNI  Follow Up Instructions: As needed   I discussed the assessment and treatment plan with the patient. The patient was provided an opportunity to ask questions and all were answered. The patient agreed with the plan and demonstrated an understanding of the instructions.    The patient was advised to call back or seek an in-person evaluation if the symptoms worsen or if the condition fails to improve as anticipated.  I provided 15 minutes of non-face-to-face time during this encounter.   Malachy Moan, NP

## 2022-09-03 NOTE — Telephone Encounter (Signed)
Yes, I want hospice help.  Please verify plan with patient/wife.  Thanks.

## 2022-09-03 NOTE — Telephone Encounter (Signed)
Called [facility] and advised that Dr. Para March would be the attending

## 2022-09-08 ENCOUNTER — Telehealth: Payer: Self-pay | Admitting: Family Medicine

## 2022-09-08 MED ORDER — RISPERIDONE 0.5 MG PO TABS: ORAL_TABLET | ORAL | 1 refills | Status: DC

## 2022-09-08 NOTE — Telephone Encounter (Signed)
Joy, Case manager, from [facility] called to let [name] know that the pt has been aggressive, agitated & having hyper sexualized behavior towards his wife, especially in the morning hours. Joy stated the pt has been taking risperiDONE (RISPERDAL) 0.5 MG tablet [[id number]] , 1.5mg  to 2 mg at bedtime. [name] is asking does [name] want to change the pt's meds or have the Hospice physician, Dr. Dan Humphreys, to manage the symptoms? Call back # [phone]

## 2022-09-08 NOTE — Telephone Encounter (Signed)
Joy from [facility] called back returning Regina's call. Told Joy, Duncan's response regarding the pt and meds. Joy stated Dr. Dan Humphreys does have input & will get info from her. Joy understood med instructions. Joy asked did Para March want Dr. Dan Humphreys to manage symptoms or not? Call back # [phone]

## 2022-09-08 NOTE — Telephone Encounter (Signed)
Risperdal had helped previously.  In the meantime I would try increasing to 1 tablet in the morning and 2 tablets in the evening.  However I would also like input from Hospice and Dr. Dan Humphreys.  If Dr. Dan Humphreys has any other input or advice then I would greatly appreciate that.  Thanks.

## 2022-09-08 NOTE — Telephone Encounter (Signed)
Left message on voicemail for Joy to call the office back.

## 2022-09-09 NOTE — Telephone Encounter (Signed)
Noted. Thanks.

## 2022-09-09 NOTE — Telephone Encounter (Signed)
I would greatly appreciate and welcome help with symptom management from Dr. Dan Humphreys.

## 2022-09-09 NOTE — Telephone Encounter (Signed)
Spoke with Joy and gave her Dr. Lianne Bushy message. They are going to get patient taken care of and wanted to let Dr. Para March know they are going to go with Dr. Lianne Bushy advised with the medication. Stated that if that does not help they are going to try Seroquel.

## 2022-09-30 ENCOUNTER — Ambulatory Visit: Payer: Medicare Other | Admitting: Internal Medicine

## 2022-10-18 ENCOUNTER — Other Ambulatory Visit: Payer: Self-pay | Admitting: Family Medicine

## 2022-10-20 ENCOUNTER — Institutional Professional Consult (permissible substitution): Payer: Medicare Other | Admitting: Cardiology

## 2022-11-03 ENCOUNTER — Telehealth: Payer: Self-pay | Admitting: Internal Medicine

## 2022-11-03 NOTE — Telephone Encounter (Signed)
Wife calling to see if patient needs to keep his appt in July since he is in hospice. Please advise

## 2022-11-03 NOTE — Telephone Encounter (Signed)
He does not need to follow-up with [place] unless there are specific cardiac concerns that he or his family wish to discuss with me.  Yvonne Kendall, MD [facility]

## 2022-11-03 NOTE — Telephone Encounter (Signed)
Pt's wife made aware and verbalized understanding Appointment cancelled

## 2022-12-01 ENCOUNTER — Other Ambulatory Visit: Payer: Self-pay | Admitting: Family Medicine

## 2022-12-09 ENCOUNTER — Ambulatory Visit: Payer: Medicare Other | Admitting: Internal Medicine

## 2022-12-20 ENCOUNTER — Telehealth: Payer: Self-pay | Admitting: Family Medicine

## 2022-12-20 NOTE — Telephone Encounter (Signed)
Patients wife call in to let [name] know that he passed away on 01/31/23 morning.

## 2022-12-20 NOTE — Telephone Encounter (Signed)
Called his widow to give condolences and she thanked me for the call.  I was always glad to see this gentleman in clinic.  He will be missed.

## 2023-01-16 DEATH — deceased

## 2023-01-31 ENCOUNTER — Ambulatory Visit: Payer: Medicare Other | Admitting: Adult Health

## 2023-04-21 ENCOUNTER — Ambulatory Visit: Payer: Medicare Other | Admitting: Dermatology
